# Patient Record
Sex: Male | Born: 1955 | Race: White | Hispanic: No | Marital: Married | Smoking: Never smoker
Health system: Southern US, Community
[De-identification: ages and names within clinical notes are randomized; demographics above are authoritative.]

## PROBLEM LIST (undated history)

## (undated) DIAGNOSIS — N200 Calculus of kidney: Secondary | ICD-10-CM

## (undated) DIAGNOSIS — Z87442 Personal history of urinary calculi: Secondary | ICD-10-CM

## (undated) DIAGNOSIS — E785 Hyperlipidemia, unspecified: Secondary | ICD-10-CM

## (undated) DIAGNOSIS — M79651 Pain in right thigh: Secondary | ICD-10-CM

## (undated) DIAGNOSIS — G629 Polyneuropathy, unspecified: Secondary | ICD-10-CM

## (undated) DIAGNOSIS — C61 Malignant neoplasm of prostate: Secondary | ICD-10-CM

## (undated) DIAGNOSIS — R911 Solitary pulmonary nodule: Secondary | ICD-10-CM

## (undated) DIAGNOSIS — M6208 Separation of muscle (nontraumatic), other site: Secondary | ICD-10-CM

## (undated) DIAGNOSIS — G5600 Carpal tunnel syndrome, unspecified upper limb: Secondary | ICD-10-CM

## (undated) DIAGNOSIS — R7301 Impaired fasting glucose: Secondary | ICD-10-CM

## (undated) DIAGNOSIS — R202 Paresthesia of skin: Secondary | ICD-10-CM

## (undated) DIAGNOSIS — M47816 Spondylosis without myelopathy or radiculopathy, lumbar region: Secondary | ICD-10-CM

## (undated) DIAGNOSIS — N529 Male erectile dysfunction, unspecified: Secondary | ICD-10-CM

## (undated) DIAGNOSIS — K432 Incisional hernia without obstruction or gangrene: Secondary | ICD-10-CM

## (undated) DIAGNOSIS — D472 Monoclonal gammopathy: Secondary | ICD-10-CM

## (undated) DIAGNOSIS — C439 Malignant melanoma of skin, unspecified: Secondary | ICD-10-CM

## (undated) HISTORY — DX: Male erectile dysfunction, unspecified: N52.9

## (undated) HISTORY — DX: Spondylosis without myelopathy or radiculopathy, lumbar region: M47.816

## (undated) HISTORY — PX: OTHER SURGICAL HISTORY: SHX169

## (undated) HISTORY — DX: Calculus of kidney: N20.0

## (undated) HISTORY — DX: Malignant neoplasm of prostate: C61

## (undated) HISTORY — DX: Carpal tunnel syndrome, unspecified upper limb: G56.00

## (undated) HISTORY — DX: Pain in right thigh: M79.651

## (undated) HISTORY — DX: Incisional hernia without obstruction or gangrene: K43.2

## (undated) HISTORY — DX: Hyperlipidemia, unspecified: E78.5

## (undated) HISTORY — DX: Polyneuropathy, unspecified: G62.9

## (undated) HISTORY — DX: Impaired fasting glucose: R73.01

## (undated) HISTORY — DX: Paresthesia of skin: R20.2

## (undated) HISTORY — DX: Malignant melanoma of skin, unspecified: C43.9

## (undated) HISTORY — DX: Solitary pulmonary nodule: R91.1

## (undated) HISTORY — DX: Separation of muscle (nontraumatic), other site: M62.08

## (undated) HISTORY — DX: Monoclonal gammopathy: D47.2

---

## 1998-05-11 HISTORY — PX: KNEE ARTHROSCOPY: SUR90

## 1998-05-11 HISTORY — PX: REFRACTIVE SURGERY: SHX103

## 2000-05-11 HISTORY — PX: ROTATOR CUFF REPAIR: SHX139

## 2001-05-11 HISTORY — PX: ROTATOR CUFF REPAIR: SHX139

## 2007-05-12 DIAGNOSIS — R911 Solitary pulmonary nodule: Secondary | ICD-10-CM

## 2007-05-12 HISTORY — DX: Solitary pulmonary nodule: R91.1

## 2007-09-30 DIAGNOSIS — D229 Melanocytic nevi, unspecified: Secondary | ICD-10-CM

## 2007-09-30 HISTORY — DX: Melanocytic nevi, unspecified: D22.9

## 2007-10-11 ENCOUNTER — Ambulatory Visit: Payer: Self-pay | Admitting: Internal Medicine

## 2007-10-11 DIAGNOSIS — J9819 Other pulmonary collapse: Secondary | ICD-10-CM | POA: Insufficient documentation

## 2007-10-11 DIAGNOSIS — R0602 Shortness of breath: Secondary | ICD-10-CM | POA: Insufficient documentation

## 2007-10-11 DIAGNOSIS — R059 Cough, unspecified: Secondary | ICD-10-CM | POA: Insufficient documentation

## 2007-10-11 DIAGNOSIS — R05 Cough: Secondary | ICD-10-CM | POA: Insufficient documentation

## 2007-10-11 DIAGNOSIS — R062 Wheezing: Secondary | ICD-10-CM | POA: Insufficient documentation

## 2007-11-21 ENCOUNTER — Encounter: Payer: Self-pay | Admitting: Internal Medicine

## 2007-11-21 ENCOUNTER — Ambulatory Visit: Payer: Self-pay | Admitting: Internal Medicine

## 2007-11-25 ENCOUNTER — Ambulatory Visit: Payer: Self-pay | Admitting: Internal Medicine

## 2008-04-24 ENCOUNTER — Ambulatory Visit: Payer: Self-pay | Admitting: Internal Medicine

## 2008-04-24 DIAGNOSIS — J984 Other disorders of lung: Secondary | ICD-10-CM | POA: Insufficient documentation

## 2008-04-24 DIAGNOSIS — J329 Chronic sinusitis, unspecified: Secondary | ICD-10-CM | POA: Insufficient documentation

## 2008-05-08 ENCOUNTER — Ambulatory Visit: Payer: Self-pay | Admitting: Cardiovascular Disease

## 2008-06-22 ENCOUNTER — Ambulatory Visit: Payer: Self-pay | Admitting: Internal Medicine

## 2009-04-16 ENCOUNTER — Ambulatory Visit: Payer: Self-pay | Admitting: Cardiovascular Disease

## 2010-06-01 ENCOUNTER — Encounter: Payer: Self-pay | Admitting: Internal Medicine

## 2011-03-27 ENCOUNTER — Encounter: Payer: Self-pay | Admitting: Gastroenterology

## 2011-04-09 ENCOUNTER — Ambulatory Visit (AMBULATORY_SURGERY_CENTER): Payer: 59 | Admitting: *Deleted

## 2011-04-09 ENCOUNTER — Encounter: Payer: Self-pay | Admitting: Gastroenterology

## 2011-04-09 VITALS — Ht 72.0 in | Wt 211.7 lb

## 2011-04-09 DIAGNOSIS — Z1211 Encounter for screening for malignant neoplasm of colon: Secondary | ICD-10-CM

## 2011-04-09 MED ORDER — PEG-KCL-NACL-NASULF-NA ASC-C 100 G PO SOLR: ORAL | Status: DC

## 2011-04-15 NOTE — Progress Notes (Signed)
Addended by: Maple Hudson on: 04/15/2011 03:57 PM   Modules accepted: Level of Service

## 2011-04-20 ENCOUNTER — Encounter: Payer: Self-pay | Admitting: Gastroenterology

## 2011-04-20 ENCOUNTER — Ambulatory Visit (AMBULATORY_SURGERY_CENTER): Payer: 59 | Admitting: Gastroenterology

## 2011-04-20 VITALS — BP 135/86 | HR 70 | Temp 97.1°F | Resp 16 | Ht 72.0 in | Wt 211.0 lb

## 2011-04-20 DIAGNOSIS — Z1211 Encounter for screening for malignant neoplasm of colon: Secondary | ICD-10-CM

## 2011-04-20 HISTORY — PX: COLONOSCOPY: SHX174

## 2011-04-20 MED ORDER — SODIUM CHLORIDE 0.9 % IV SOLN: 500.0000 mL | INTRAVENOUS | Status: DC

## 2011-04-20 NOTE — Patient Instructions (Signed)
Please refer to your blue and neon green sheets for instructions regarding diet and activity for the rest of today.  You may resume your medications as you would normally take them.  

## 2011-04-20 NOTE — Progress Notes (Signed)
Patient did not experience any of the following events: a burn prior to discharge; a fall within the facility; wrong site/side/patient/procedure/implant event; or a hospital transfer or hospital admission upon discharge from the facility. (G8907) Patient did not have preoperative order for IV antibiotic SSI prophylaxis. (G8918)  

## 2011-04-20 NOTE — Progress Notes (Signed)
Pressure applied to the abdomen to reach cecum. 

## 2011-04-20 NOTE — Op Note (Signed)
[facility] [address] [place], [place]  [address]  COLONOSCOPY PROCEDURE REPORT  PATIENT:  Rodney Watkins, Rodney Watkins  MR#:  [id number] BIRTHDATE:  04/10/1956, 55 yrs. old  GENDER:  male ENDOSCOPIST:  Rachael Fee, MD PROCEDURE DATE:  04/20/2011 PROCEDURE:  Colonoscopy 19147 ASA CLASS:  Class II INDICATIONS:  Routine Risk Screening MEDICATIONS:   Fentanyl 75 mcg IV, These medications were titrated to patient response per physician's verbal order, Versed 8 mg IV  DESCRIPTION OF PROCEDURE:   After the risks benefits and alternatives of the procedure were thoroughly explained, informed consent was obtained.  Digital rectal exam was performed and revealed no rectal masses.   The [id number] endoscope was introduced through the anus and advanced to the cecum, which was identified by both the appendix and ileocecal valve, without limitations.  The quality of the prep was good..  The instrument was then slowly withdrawn as the colon was fully examined. <<PROCEDUREIMAGES>> FINDINGS:  A normal appearing cecum, ileocecal valve, and appendiceal orifice were identified. The ascending, hepatic flexure, transverse, splenic flexure, descending, sigmoid colon, and rectum appeared unremarkable (see image1, image2, and image3). Retroflexed views in the rectum revealed no abnormalities. COMPLICATIONS:  None  ENDOSCOPIC IMPRESSION: 1) Normal colon 2) No polyps or cancers  RECOMMENDATIONS: 1) You should continue to follow colorectal cancer screening guidelines for "routine risk" patients with a repeat colonoscopy in 10 years. There is no need for FOBT (stool) testing for at least 5 years.  REPEAT EXAM:  10 years  ______________________________ Rachael Fee, MD  cc: Kathrine Haddock, MD  n. Rosalie Doctor:   Rachael Fee at 04/20/2011 10:09 AM  Karlton Lemon, [phone]

## 2011-04-21 ENCOUNTER — Telehealth: Payer: Self-pay | Admitting: *Deleted

## 2011-04-21 NOTE — Telephone Encounter (Signed)

## 2011-08-05 DIAGNOSIS — C4492 Squamous cell carcinoma of skin, unspecified: Secondary | ICD-10-CM

## 2011-08-05 HISTORY — DX: Squamous cell carcinoma of skin, unspecified: C44.92

## 2011-11-18 DIAGNOSIS — L814 Other melanin hyperpigmentation: Secondary | ICD-10-CM

## 2011-11-18 HISTORY — DX: Other melanin hyperpigmentation: L81.4

## 2012-07-27 LAB — PULMONARY FUNCTION TEST

## 2013-08-28 LAB — PULMONARY FUNCTION TEST

## 2015-03-26 ENCOUNTER — Ambulatory Visit (INDEPENDENT_AMBULATORY_CARE_PROVIDER_SITE_OTHER): Payer: Commercial Managed Care - HMO | Admitting: Family Medicine

## 2015-03-26 ENCOUNTER — Encounter: Payer: Self-pay | Admitting: Family Medicine

## 2015-03-26 VITALS — BP 144/90 | HR 68 | Temp 97.7°F | Resp 16 | Ht 71.75 in | Wt 208.0 lb

## 2015-03-26 DIAGNOSIS — R030 Elevated blood-pressure reading, without diagnosis of hypertension: Secondary | ICD-10-CM

## 2015-03-26 DIAGNOSIS — Z0000 Encounter for general adult medical examination without abnormal findings: Secondary | ICD-10-CM | POA: Diagnosis not present

## 2015-03-26 NOTE — Progress Notes (Signed)
Pre visit review using our clinic review tool, if applicable. No additional management support is needed unless otherwise documented below in the visit note. 

## 2015-03-26 NOTE — Progress Notes (Signed)
Office Note 03/26/2015  CC:  Chief Complaint  Patient presents with  . Establish Care  . Annual Exam    Pt is not fasting.     HPI:  Rodney Watkins is a 59 y.o. White male who is here to establish care, get CPE. Patient's most recent primary MD: none Old records in EPIC/HL were reviewed prior to or during today's visit.  Gets CPE with fire dept annually, most recent around 06/2014---gets full lab panel including PSA and possibly A1c with this.  He'll drop off these notes/labs from the last 5 yrs when he gets a chance. Says he does not usually get DRE with these exams though, so we decided to include this in today's exam.  Past Medical History  Diagnosis Date  . Pulmonary nodule 2009    Stable CT chest 2010--no further scans needed in pt at low risk for lung ca.  . Increased prostate specific antigen (PSA) velocity     saw urol and was reassured    Past Surgical History  Procedure Laterality Date  . Rotator cuff repair  2002    right  . Rotator cuff repair  2003    left  . Knee arthroscopy  2000    left  . Refractive surgery  2000    both eyes  . Colonoscopy  04/20/11    Normal: recall 10 yrs  . Ett  approx 2010    normal    Family History  Problem Relation Age of Onset  . Colon cancer Neg Hx   . Stomach cancer Neg Hx   . Diabetes Father   . Cancer Sister     unknown    Social History   Social History  . Marital Status: Married    Spouse Name: N/A  . Number of Children: N/A  . Years of Education: N/A   Occupational History  . Not on file.   Social History Main Topics  . Smoking status: Former Smoker    Quit date: 04/08/1982  . Smokeless tobacco: Never Used  . Alcohol Use: 1.8 oz/week    3 Cans of beer per week  . Drug Use: No  . Sexual Activity: Not on file   Other Topics Concern  . Not on file   Social History Narrative   Married, 3 daughters.   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   No T/A/Ds.      MEDS: none  No Known Allergies  ROS Review of Systems  Constitutional: Negative for fever, chills, appetite change and fatigue.  HENT: Negative for congestion, dental problem, ear pain and sore throat.   Eyes: Negative for discharge, redness and visual disturbance.  Respiratory: Negative for cough, chest tightness, shortness of breath and wheezing.   Cardiovascular: Negative for chest pain, palpitations and leg swelling.  Gastrointestinal: Negative for nausea, vomiting, abdominal pain, diarrhea and blood in stool.  Genitourinary: Negative for dysuria, urgency, frequency, hematuria, flank pain and difficulty urinating.  Musculoskeletal: Negative for myalgias, joint swelling and neck stiffness.       Some recent low back, L hip, and left leg pain that he is under the care of ortho, Dr. Ninfa Linden, for.  Skin: Negative for pallor and rash.  Neurological: Negative for dizziness, speech difficulty, weakness and headaches.  Hematological: Negative for adenopathy. Does not bruise/bleed easily.  Psychiatric/Behavioral: Negative for confusion and sleep disturbance. The patient is not nervous/anxious.     PE; Blood pressure 144/90,  pulse 68, temperature 97.7 F (36.5 C), temperature source Oral, resp. rate 16, height 5' 11.75" (1.822 m), weight 208 lb (94.348 kg), SpO2 97 %.  Repeat bp 144/90 Gen: Alert, well appearing.  Patient is oriented to person, place, time, and situation. AFFECT: pleasant, lucid thought and speech. ENT: Ears: EACs clear, normal epithelium.  TMs with good light reflex and landmarks bilaterally.  Eyes: no injection, icteris, swelling, or exudate.  EOMI, PERRLA. Nose: no drainage or turbinate edema/swelling.  No injection or focal lesion.  Mouth: lips without lesion/swelling.  Oral mucosa pink and moist.  Dentition intact and without obvious caries or gingival swelling.  Oropharynx without erythema, exudate, or swelling.  Neck: supple/nontender.  No LAD, mass,  or TM.  Carotid pulses 2+ bilaterally, without bruits. CV: RRR, no m/r/g.   LUNGS: CTA bilat, nonlabored resps, good aeration in all lung fields. ABD: soft, NT, ND, BS normal.  No hepatospenomegaly or mass.  No bruits. EXT: no clubbing, cyanosis, or edema.  Musculoskeletal: no joint swelling, erythema, warmth, or tenderness.  ROM of all joints intact. Skin - no sores or suspicious lesions or rashes or color changes Rectal exam: negative without mass, lesions or tenderness, PROSTATE EXAM: smooth and symmetric without nodules or tenderness.  Pertinent labs:  None today  ASSESSMENT AND PLAN:   New pt; no old PCP records to obtain.  1) Health maintenance exam: Reviewed age and gender appropriate health maintenance issues (prudent diet, regular exercise, health risks of tobacco and excessive alcohol, use of seatbelts, fire alarms in home, use of sunscreen).  Also reviewed age and gender appropriate health screening as well as vaccine recommendations. No vaccines due at this time. No labs due at this time.   DRE normal today: will review pt's PSA's when he brings in his old records.  2) Elevated bp w/out dx of HTN: plan is to check bp outside of office 3-4 times per week and write them down and return to go over them together in 3-4 wks.  3) Recent musculoskeletal pain (LB, L hip, L leg): sounds like overuse/strain.  He is seeing ortho, Dr. Ninfa Linden for this. Apparently got a shot in his hip, not much help.  Relative rest helps some.  Has appt to revisit ortho soon. Will try to get records.  An After Visit Summary was printed and given to the patient.  Return for 3-4 week f/u elevated bp.

## 2015-03-26 NOTE — Patient Instructions (Signed)
Check your blood pressure 3-4 times per week and write these down.  Return to office for review of these with me in 3-4 weeks.

## 2015-04-02 ENCOUNTER — Other Ambulatory Visit: Payer: Self-pay | Admitting: Orthopaedic Surgery

## 2015-04-02 DIAGNOSIS — M545 Low back pain: Secondary | ICD-10-CM

## 2015-04-17 ENCOUNTER — Ambulatory Visit (INDEPENDENT_AMBULATORY_CARE_PROVIDER_SITE_OTHER): Payer: Commercial Managed Care - HMO | Admitting: Family Medicine

## 2015-04-17 ENCOUNTER — Ambulatory Visit
Admission: RE | Admit: 2015-04-17 | Discharge: 2015-04-17 | Disposition: A | Discharge status: Home / Self Care | Payer: Commercial Managed Care - HMO | Source: Ambulatory Visit | Attending: Orthopaedic Surgery | Admitting: Orthopaedic Surgery

## 2015-04-17 ENCOUNTER — Encounter: Payer: Self-pay | Admitting: Family Medicine

## 2015-04-17 VITALS — BP 161/84 | HR 70 | Temp 97.9°F | Resp 16 | Ht 71.75 in | Wt 209.5 lb

## 2015-04-17 DIAGNOSIS — R030 Elevated blood-pressure reading, without diagnosis of hypertension: Secondary | ICD-10-CM | POA: Diagnosis not present

## 2015-04-17 DIAGNOSIS — M545 Low back pain: Secondary | ICD-10-CM

## 2015-04-17 NOTE — Progress Notes (Signed)
Pre visit review using our clinic review tool, if applicable. No additional management support is needed unless otherwise documented below in the visit note. 

## 2015-04-17 NOTE — Progress Notes (Signed)
OFFICE VISIT  04/17/2015   CC:  Chief Complaint  Patient presents with  . Follow-up    Elevated BP. Pt is fasting.    HPI:    Patient is a 59 y.o. Caucasian male who presents for 3 week f/u for elevated blood pressure w/out dx of HTN. Lots of stress with job/family lately/financial.  In pain with back and legs. Avg syst with home checks lately 140s, avg diast 70s. He has cut out excessive coffee intake.  Not exercising.   Says he is naturally a high strung/type A personality type person.  Past Medical History  Diagnosis Date  . Pulmonary nodule 2009    Stable CT chest 2010--no further scans needed in pt at low risk for lung ca.  . Increased prostate specific antigen (PSA) velocity     saw urol and was reassured    Past Surgical History  Procedure Laterality Date  . Rotator cuff repair  2002    right  . Rotator cuff repair  2003    left  . Knee arthroscopy  2000    left  . Refractive surgery  2000    both eyes  . Colonoscopy  04/20/11    Normal: recall 10 yrs  . Ett  approx 2010    normal    No outpatient prescriptions prior to visit.   No facility-administered medications prior to visit.    No Known Allergies  ROS As per HPI  PE: Blood pressure 161/84, pulse 70, temperature 97.9 F (36.6 C), temperature source Oral, resp. rate 16, height 5' 11.75" (1.822 m), weight 209 lb 8 oz (95.029 kg), SpO2 95 %. Gen: Alert, well appearing.  Patient is oriented to person, place, time, and situation. AFFECT: pleasant, lucid thought and speech. No further exam today.  LABS:  none  IMPRESSION AND PLAN:  Elevated bp w/out dx of HTN: I told him I think he likely has mild HTN and that the outside stressors and pain he deals with may or may not be affecting his bp, hard to tell.  I recommended we start a bp med today but he opted to continue obs of bp with home monitoring at this time. Hopefully some of the stressors in his life will be alleviated soon and we'll see how  bp responds.  An After Visit Summary was printed and given to the patient.   FOLLOW UP: Return in about 6 months (around 10/16/2015) for f/u elevated bp.

## 2015-04-19 ENCOUNTER — Encounter: Payer: Self-pay | Admitting: Family Medicine

## 2015-10-16 ENCOUNTER — Ambulatory Visit: Payer: Commercial Managed Care - HMO | Admitting: Family Medicine

## 2016-07-07 ENCOUNTER — Other Ambulatory Visit: Payer: Self-pay | Admitting: Neurosurgery

## 2016-07-07 DIAGNOSIS — C61 Malignant neoplasm of prostate: Secondary | ICD-10-CM

## 2016-07-09 ENCOUNTER — Ambulatory Visit
Admission: RE | Admit: 2016-07-09 | Discharge: 2016-07-09 | Disposition: A | Discharge status: Home / Self Care | Payer: Commercial Managed Care - HMO | Source: Ambulatory Visit | Attending: Neurosurgery | Admitting: Neurosurgery

## 2016-07-09 DIAGNOSIS — C61 Malignant neoplasm of prostate: Secondary | ICD-10-CM

## 2016-07-09 MED ORDER — GADOBENATE DIMEGLUMINE 529 MG/ML IV SOLN
19.0000 mL | Freq: Once | INTRAVENOUS | Status: AC | PRN
  Administered 2016-07-09: 19 mL via INTRAVENOUS

## 2016-07-16 ENCOUNTER — Encounter (HOSPITAL_COMMUNITY): Payer: Self-pay | Admitting: *Deleted

## 2016-07-16 ENCOUNTER — Other Ambulatory Visit: Payer: Self-pay | Admitting: Urology

## 2016-07-29 ENCOUNTER — Encounter: Payer: Self-pay | Admitting: Family Medicine

## 2016-08-03 NOTE — Patient Instructions (Addendum)
REMINGTON HIGHBAUGH III  08/03/2016   Your procedure is scheduled on: 3/29  Report to [facility] Main  Entrance take [facility]  elevators to 3rd floor to  [place] at    0900 AM.  Call this number if you have problems the morning of surgery [phone]   Remember: ONLY 1 PERSON MAY GO WITH YOU TO SHORT STAY TO GET  READY MORNING OF YOUR SURGERY.               ONE bottle of magnesium citrate  By noon the day before surgery  And one fleets enema the night before surgery  CLEAR LIQUID DIET   The Day before surgery   Foods Allowed                                                                     Foods Excluded  Coffee and tea, regular and decaf                             liquids that you cannot  Plain Jell-O in any flavor                                             see through such as: Fruit ices (not with fruit pulp)                                     milk, soups, orange juice  Iced Popsicles                                    All solid food Carbonated beverages, regular and diet                                    Cranberry, grape and apple juices Sports drinks like Gatorade Lightly seasoned clear broth or consume(fat free) Sugar, honey syrup  Sample Menu Breakfast                                Lunch                                     Supper Cranberry juice                    Beef broth                            Chicken broth Jell-O  Grape juice                           Apple juice Coffee or tea                        Jell-O                                      Popsicle                                                Coffee or tea                        Coffee or tea  _____________________________________________________________________    Do not eat food or drink liquids :After Midnight.     Take these medicines the morning of surgery with A SIP OF WATER: Cipro if still taking, zyrtec if needed                   You may not have any metal on your body including hair pins and              piercings  Do not wear jewelry,  lotions, powders or perfumes, deodorant                         Men may shave face and neck.   Do not bring valuables to the hospital. [place].  Contacts, dentures or bridgework may not be worn into surgery.  Leave suitcase in the car. After surgery it may be brought to your room.                  Please read over the following fact sheets you were given: _____________________________________________________________________ [facility] - Preparing for Surgery Before surgery, you can play an important role.  Because skin is not sterile, your skin needs to be as free of germs as possible.  You can reduce the number of germs on your skin by washing with CHG (chlorahexidine gluconate) soap before surgery.  CHG is an antiseptic cleaner which kills germs and bonds with the skin to continue killing germs even after washing. Please DO NOT use if you have an allergy to CHG or antibacterial soaps.  If your skin becomes reddened/irritated stop using the CHG and inform your nurse when you arrive at Short Stay. Do not shave (including legs and underarms) for at least 48 hours prior to the first CHG shower.  You may shave your face/neck. Please follow these instructions carefully:  1.  Shower with CHG Soap the night before surgery and the  morning of Surgery.  2.  If you choose to wash your hair, wash your hair first as usual with your  normal  shampoo.  3.  After you shampoo, rinse your hair and body thoroughly to remove the  shampoo.                           4.  Use CHG as  you would any other liquid soap.  You can apply chg directly  to the skin and wash                       Gently with a scrungie or clean washcloth.  5.  Apply the CHG Soap to your body ONLY FROM THE NECK DOWN.   Do not use on face/ open                            Wound or open sores. Avoid contact with eyes, ears mouth and genitals (private parts).                       Wash face,  Genitals (private parts) with your normal soap.             6.  Wash thoroughly, paying special attention to the area where your surgery  will be performed.  7.  Thoroughly rinse your body with warm water from the neck down.  8.  DO NOT shower/wash with your normal soap after using and rinsing off  the CHG Soap.                9.  Pat yourself dry with a clean towel.            10.  Wear clean pajamas.            11.  Place clean sheets on your bed the night of your first shower and do not  sleep with pets. Day of Surgery : Do not apply any lotions/deodorants the morning of surgery.  Please wear clean clothes to the hospital/surgery center.  FAILURE TO FOLLOW THESE INSTRUCTIONS MAY RESULT IN THE CANCELLATION OF YOUR SURGERY PATIENT SIGNATURE_________________________________  NURSE SIGNATURE__________________________________  ________________________________________________________________________   Adam Phenix  An incentive spirometer is a tool that can help keep your lungs clear and active. This tool measures how well you are filling your lungs with each breath. Taking long deep breaths may help reverse or decrease the chance of developing breathing (pulmonary) problems (especially infection) following:  A long period of time when you are unable to move or be active. BEFORE THE PROCEDURE   If the spirometer includes an indicator to show your best effort, your nurse or respiratory therapist will set it to a desired goal.  If possible, sit up straight or lean slightly forward. Try not to slouch.  Hold the incentive spirometer in an upright position. INSTRUCTIONS FOR USE  1. Sit on the edge of your bed if possible, or sit up as far as you can in bed or on a chair. 2. Hold the incentive spirometer in an upright position. 3. Breathe out normally. 4. Place  the mouthpiece in your mouth and seal your lips tightly around it. 5. Breathe in slowly and as deeply as possible, raising the piston or the ball toward the top of the column. 6. Hold your breath for 3-5 seconds or for as long as possible. Allow the piston or ball to fall to the bottom of the column. 7. Remove the mouthpiece from your mouth and breathe out normally. 8. Rest for a few seconds and repeat Steps 1 through 7 at least 10 times every 1-2 hours when you are awake. Take your time and take a few normal breaths between deep breaths. 9. The spirometer may include an indicator to show your best effort. Use  the indicator as a goal to work toward during each repetition. 10. After each set of 10 deep breaths, practice coughing to be sure your lungs are clear. If you have an incision (the cut made at the time of surgery), support your incision when coughing by placing a pillow or rolled up towels firmly against it. Once you are able to get out of bed, walk around indoors and cough well. You may stop using the incentive spirometer when instructed by your caregiver.  RISKS AND COMPLICATIONS  Take your time so you do not get dizzy or light-headed.  If you are in pain, you may need to take or ask for pain medication before doing incentive spirometry. It is harder to take a deep breath if you are having pain. AFTER USE  Rest and breathe slowly and easily.  It can be helpful to keep track of a log of your progress. Your caregiver can provide you with a simple table to help with this. If you are using the spirometer at home, follow these instructions: [place] IF:   You are having difficultly using the spirometer.  You have trouble using the spirometer as often as instructed.  Your pain medication is not giving enough relief while using the spirometer.  You develop fever of 100.5 F (38.1 C) or higher. SEEK IMMEDIATE MEDICAL CARE IF:   You cough up bloody sputum that had not been  present before.  You develop fever of 102 F (38.9 C) or greater.  You develop worsening pain at or near the incision site. MAKE SURE YOU:   Understand these instructions.  Will watch your condition.  Will get help right away if you are not doing well or get worse. Document Released: 09/07/2006 Document Revised: 07/20/2011 Document Reviewed: 11/08/2006 [facility] Patient Information 2014 [place], [place].   ________________________________________________________________________

## 2016-08-04 ENCOUNTER — Encounter (HOSPITAL_COMMUNITY)
Admission: RE | Admit: 2016-08-04 | Discharge: 2016-08-04 | Disposition: A | Discharge status: Home / Self Care | Payer: Commercial Managed Care - HMO | Source: Ambulatory Visit | Attending: Urology | Admitting: Urology

## 2016-08-04 ENCOUNTER — Encounter (HOSPITAL_COMMUNITY): Payer: Self-pay

## 2016-08-04 ENCOUNTER — Ambulatory Visit (HOSPITAL_COMMUNITY)
Admission: RE | Admit: 2016-08-04 | Discharge: 2016-08-04 | Disposition: A | Discharge status: Home / Self Care | Payer: Commercial Managed Care - HMO | Source: Ambulatory Visit | Attending: Urology | Admitting: Urology

## 2016-08-04 DIAGNOSIS — R918 Other nonspecific abnormal finding of lung field: Secondary | ICD-10-CM | POA: Diagnosis not present

## 2016-08-04 DIAGNOSIS — Z01810 Encounter for preprocedural cardiovascular examination: Secondary | ICD-10-CM | POA: Diagnosis present

## 2016-08-04 DIAGNOSIS — Z01818 Encounter for other preprocedural examination: Secondary | ICD-10-CM | POA: Diagnosis present

## 2016-08-04 DIAGNOSIS — Z01812 Encounter for preprocedural laboratory examination: Secondary | ICD-10-CM | POA: Insufficient documentation

## 2016-08-04 HISTORY — DX: Personal history of urinary calculi: Z87.442

## 2016-08-04 LAB — BASIC METABOLIC PANEL
ANION GAP: 5 (ref 5–15)
BUN: 17 mg/dL (ref 6–20)
CHLORIDE: 110 mmol/L (ref 101–111)
CO2: 24 mmol/L (ref 22–32)
Calcium: 10 mg/dL (ref 8.9–10.3)
Creatinine, Ser: 0.97 mg/dL (ref 0.61–1.24)
GFR calc non Af Amer: >60 mL/min (ref >60)
Glucose, Bld: 96 mg/dL (ref 65–99)
POTASSIUM: 4 mmol/L (ref 3.5–5.1)
Sodium: 139 mmol/L (ref 135–145)

## 2016-08-04 LAB — CBC
HEMATOCRIT: 42.3 % (ref 39.0–52.0)
HEMOGLOBIN: 14.3 g/dL (ref 13.0–17.0)
MCH: 30.1 pg (ref 26.0–34.0)
MCHC: 33.8 g/dL (ref 30.0–36.0)
MCV: 89.1 fL (ref 78.0–100.0)
PLATELETS: 262 10*3/uL (ref 150–400)
RBC: 4.75 MIL/uL (ref 4.22–5.81)
RDW: 14 % (ref 11.5–15.5)
WBC: 5.5 10*3/uL (ref 4.0–10.5)

## 2016-08-04 LAB — ABO/RH: ABO/RH(D): A POS

## 2016-08-05 NOTE — H&P (Signed)
Office Visit Report     07/24/2016   --------------------------------------------------------------------------------   Rodney Watkins  MRN: [id number]  PRIMARY CARE:  Rito Ehrlich. Geoffry Paradise, MD  DOB: 09-Sep-1955, 61 year old Male  REFERRING:  Rana Snare, MD  SSN: -[id number]  PROVIDER:  Rana Snare, M.D.    TREATING:  Raynelle Bring, M.D.    LOCATION:  [facility] [phone]   --------------------------------------------------------------------------------   CC/HPI: CC: Prostate Cancer   Physician requesting consult: Dr. Rana Snare  PCP: Dr. Dannial Monarch   Rodney Watkins is a 61 year old gentleman with a rising PSA that increased to 3.22 (baseline was 1.8). This prompted a TRUS biopsy of the prostate on 06/17/16 that demonstrated Gleason 3+3=6 adenocarcinoma of the prostate with 9 out of 12 biopsy cores positive for malignancy. He did develop prostatitis following his biopsy and is currently on a long course of treatment with ciprofloxacin.   Family history: None.   Imaging studies: None.   PMH: He has no major medical comorbid conditions.  PSH: No abdominal surgeries.   TNM stage: cT1c Nx Mx  PSA: 3.22  Gleason score: 3+3=6  Biopsy (06/17/16): 9/12 cores positive  Left: L apex (50%, PNI), L lateral mid (10%, 3+3=6), L mid (40%, 3+3=6, PNI), L lateral base (30%, 3+3=6)  Right: R apex (10%, 3+3=6), R mid (70%, 3+3=6, PNI), R lateral mid (20%, 3+3=6), R base (90%, 3+3=6, PNI), R lateral base (20%, 3+3=6)  Prostate volume: 33.0 cc  PSAD: 0.10   Nomogram  OC disease: 41%  EPE: 58%  SVI: 4%  LNI: 3%  PFS (5 year, 10 year): 94%,90%   Urinary function: IPSS is 11. His most bothersome symptoms are nocturia and urgency/frequency. These symptoms have been exacerbated by his recent episode of prostatitis but did exist even at baseline.  Erectile function: SHIM score is 22. He is able to achieve erections adequate for intercourse without medication on a regular basis.      ALLERGIES: No Allergies    MEDICATIONS: Cipro 500 mg tablet 1 tablet PO BID  Meloxicam 15 mg tablet 1 tablet PO Daily  Aleve  Multi Vitamin/Minerals TABS Oral     GU PSH: Prostate Needle Biopsy - 06/17/2016      PSH Notes: Knee Surgery, Shoulder Surgery   NON-GU PSH: Surgical Pathology, Gross And Microscopic Examination For Prostate Needle - 06/17/2016    GU PMH: Acute prostatitis, Inflammatory, non bacterial, pending urine c/s. - 07/07/2016 Prostate Cancer - 06/24/2016 ED, arterial insufficiency, Erectile dysfunction due to arterial insufficiency - 07/31/2015 BPH w/LUTS, Benign prostatic hyperplasia with urinary obstruction - 2014 Testicular pain, unspecified, Testicular pain - 2014      PMH Notes:  2012-11-08 09:11:25 - Note: No Medical Problems   NON-GU PMH: Personal history of nicotine dependence, History of tobacco use - 2014    FAMILY HISTORY: Diabetes - Father Family Health Status Number - Father Family Health Status Of Mother - Alive 85 - Father Father Deceased At [employer] ___ - Father nephrolithiasis - Father   SOCIAL HISTORY: Marital Status: Married Current Smoking Status: Patient has never smoked.  Drinks 2 drinks per week. Types of alcohol consumed: Beer. Light Drinker.  Drinks 1 caffeinated drink per day. Patient's occupation Building services engineer.    REVIEW OF SYSTEMS:    GU Review Male:   Patient denies frequent urination, hard to postpone urination, burning/ pain with urination, get up at night to urinate, leakage of urine, stream starts and stops, trouble starting your  streams, and have to strain to urinate .  Gastrointestinal (Lower):   Patient denies diarrhea and constipation.  Gastrointestinal (Upper):   Patient denies nausea and vomiting.  Constitutional:   Patient denies fever, night sweats, weight loss, and fatigue.  Skin:   Patient denies skin rash/ lesion and itching.  Eyes:   Patient denies blurred vision and double vision.  Ears/ Nose/ Throat:    Patient denies sore throat and sinus problems.  Hematologic/Lymphatic:   Patient denies easy bruising and swollen glands.  Cardiovascular:   Patient denies leg swelling and chest pains.  Respiratory:   Patient denies cough and shortness of breath.  Endocrine:   Patient denies excessive thirst.  Musculoskeletal:   Patient denies back pain and joint pain.  Neurological:   Patient denies headaches and dizziness.  Psychologic:   Patient denies depression and anxiety.   VITAL SIGNS:      07/24/2016 11:49 AM  Weight 205 lb / 92.99 kg  Height 73 in / 185.42 cm  BP 123/77 mmHg  Pulse 78 /min  BMI 27.0 kg/m   GU PHYSICAL EXAMINATION:    Prostate: 35 cc. No nodularity or induration.   MULTI-SYSTEM PHYSICAL EXAMINATION:    Constitutional: Well-nourished. No physical deformities. Normally developed. Good grooming.  Neck: Neck symmetrical, not swollen. Normal tracheal position.  Respiratory: No labored breathing, no use of accessory muscles. Clear bilaterally.  Cardiovascular: Normal temperature, normal extremity pulses, no swelling, no varicosities. Regular rate and rhythm.  Lymphatic: No enlargement of neck, axillae, groin.  Skin: No paleness, no jaundice, no cyanosis. No lesion, no ulcer, no rash.  Neurologic / Psychiatric: Oriented to time, oriented to place, oriented to person. No depression, no anxiety, no agitation.  Gastrointestinal: No mass, no tenderness, no rigidity, non obese abdomen.  Eyes: Normal conjunctivae. Normal eyelids.  Ears, Nose, Mouth, and Throat: Left ear no scars, no lesions, no masses. Right ear no scars, no lesions, no masses. Nose no scars, no lesions, no masses. Normal hearing. Normal lips.  Musculoskeletal: Normal gait and station of head and neck.     PAST DATA REVIEWED:  Source Of History:  Patient  Lab Test Review:   PSA  Records Review:   Pathology Reports, Previous Patient Records  Urine Test Review:   Urinalysis   03/17/16 09/07/15 11/08/12  PSA   Total PSA 3.22 ng/dl 2.91  1.74   Free PSA  0.45    % Free PSA  15      PROCEDURES:          Urinalysis - 81003 Dipstick Dipstick Cont'd  Color: Yellow Bilirubin: Neg  Appearance: Clear Ketones: Neg  Specific Gravity: 1.025 Blood: Neg  pH: 5.5 Protein: Neg  Glucose: Neg Urobilinogen: 0.2    Nitrites: Neg    Leukocyte Esterase: Neg    Notes:      ASSESSMENT:      ICD-10 Details  1 GU:   Prostate Cancer - C61    PLAN:           Schedule Return Visit/Planned Activity: Keep Scheduled Appointment          Document Letter(s):  Created for Patient: Clinical Summary         Notes:   1. Low risk, but high volume prostate cancer: I had a detailed discussion with Rodney Watkins and his wife today. He has been very well-informed through his prior discussions with Dr. Risa Grill and has elected to proceed with surgical treatment. This appears to be quite appropriate  considering his life expectancy and the higher volume of cancer present.   The patient was counseled about the natural history of prostate cancer and the standard treatment options that are available for prostate cancer. It was explained to him how his age and life expectancy, clinical stage, Gleason score, and PSA affect his prognosis, the decision to proceed with additional staging studies, as well as how that information influences recommended treatment strategies. We discussed the roles for active surveillance, radiation therapy, surgical therapy, androgen deprivation, as well as ablative therapy options for the treatment of prostate cancer as appropriate to his individual cancer situation. We discussed the risks and benefits of these options with regard to their impact on cancer control and also in terms of potential adverse events, complications, and impact on quality of life particularly related to urinary and sexual function. The patient was encouraged to ask questions throughout the discussion today and all questions were  answered to his stated satisfaction. In addition, the patient was provided with and/or directed to appropriate resources and literature for further education about prostate cancer and treatment options.   We discussed surgical therapy for prostate cancer including the different available surgical approaches. We discussed, in detail, the risks and expectations of surgery with regard to cancer control, urinary control, and erectile function as well as the expected postoperative recovery process. Additional risks of surgery including but not limited to bleeding, infection, hernia formation, nerve damage, lymphocele formation, bowel/rectal injury potentially necessitating colostomy, damage to the urinary tract resulting in urine leakage, urethral stricture, and the cardiopulmonary risks such as myocardial infarction, stroke, death, venothromboembolism, etc. were explained. The risk of open surgical conversion for robotic/laparoscopic prostatectomy was also discussed.    He is scheduled to undergo a bilateral nerve sparing robot-assisted laparoscopic radical prostatectomy and will also undergo a bilateral pelvic lymphadenectomy based on his nomogram percentage risk of lymph node involvement.   Cc: Dr. Rana Snare  Dr. Shawnie Dapper

## 2016-08-06 ENCOUNTER — Ambulatory Visit (HOSPITAL_COMMUNITY): Payer: Commercial Managed Care - HMO | Admitting: Anesthesiology

## 2016-08-06 ENCOUNTER — Observation Stay (HOSPITAL_COMMUNITY)
Admission: RE | Admit: 2016-08-06 | Discharge: 2016-08-07 | Disposition: A | Discharge status: Home / Self Care | Payer: Commercial Managed Care - HMO | Source: Ambulatory Visit | Attending: Urology | Admitting: Urology

## 2016-08-06 ENCOUNTER — Encounter (HOSPITAL_COMMUNITY)
Admission: RE | Disposition: A | Discharge status: Home / Self Care | Payer: Self-pay | Source: Ambulatory Visit | Attending: Urology

## 2016-08-06 ENCOUNTER — Encounter (HOSPITAL_COMMUNITY): Payer: Self-pay | Admitting: *Deleted

## 2016-08-06 DIAGNOSIS — C61 Malignant neoplasm of prostate: Secondary | ICD-10-CM | POA: Diagnosis not present

## 2016-08-06 DIAGNOSIS — Z87891 Personal history of nicotine dependence: Secondary | ICD-10-CM | POA: Insufficient documentation

## 2016-08-06 HISTORY — PX: LYMPHADENECTOMY: SHX5960

## 2016-08-06 HISTORY — PX: ROBOT ASSISTED LAPAROSCOPIC RADICAL PROSTATECTOMY: SHX5141

## 2016-08-06 LAB — TYPE AND SCREEN
ABO/RH(D): A POS
Antibody Screen: NEGATIVE

## 2016-08-06 LAB — HEMOGLOBIN AND HEMATOCRIT, BLOOD
HCT: 43 % (ref 39.0–52.0)
Hemoglobin: 14.4 g/dL (ref 13.0–17.0)

## 2016-08-06 SURGERY — XI ROBOTIC ASSISTED LAPAROSCOPIC RADICAL PROSTATECTOMY LEVEL 2
Anesthesia: General

## 2016-08-06 MED ORDER — EPHEDRINE SULFATE 50 MG/ML IJ SOLN
INTRAMUSCULAR | Status: DC | PRN
  Administered 2016-08-06: 10 mg via INTRAVENOUS

## 2016-08-06 MED ORDER — SUGAMMADEX SODIUM 200 MG/2ML IV SOLN
INTRAVENOUS | Status: AC
  Filled 2016-08-06: qty 2

## 2016-08-06 MED ORDER — EPHEDRINE 5 MG/ML INJ
INTRAVENOUS | Status: AC
  Filled 2016-08-06: qty 10

## 2016-08-06 MED ORDER — LACTATED RINGERS IV SOLN
INTRAVENOUS | Status: DC | PRN
  Administered 2016-08-06: 1 mL

## 2016-08-06 MED ORDER — MIDAZOLAM HCL 2 MG/2ML IJ SOLN
INTRAMUSCULAR | Status: AC
  Filled 2016-08-06: qty 2

## 2016-08-06 MED ORDER — ONDANSETRON HCL 4 MG/2ML IJ SOLN
INTRAMUSCULAR | Status: DC | PRN
  Administered 2016-08-06: 4 mg via INTRAVENOUS

## 2016-08-06 MED ORDER — KETOROLAC TROMETHAMINE 30 MG/ML IJ SOLN
INTRAMUSCULAR | Status: AC
  Filled 2016-08-06: qty 1

## 2016-08-06 MED ORDER — HYDROCODONE-ACETAMINOPHEN 5-325 MG PO TABS: 1.0000 | ORAL_TABLET | Freq: Four times a day (QID) | ORAL | 0 refills | Status: DC | PRN

## 2016-08-06 MED ORDER — MORPHINE SULFATE (PF) 4 MG/ML IV SOLN
2.0000 mg | INTRAVENOUS | Status: DC | PRN
  Administered 2016-08-07: 2 mg via INTRAVENOUS
  Filled 2016-08-06: qty 1

## 2016-08-06 MED ORDER — DOCUSATE SODIUM 100 MG PO CAPS
100.0000 mg | ORAL_CAPSULE | Freq: Two times a day (BID) | ORAL | Status: DC
  Administered 2016-08-06 – 2016-08-07 (×2): 100 mg via ORAL
  Filled 2016-08-06 (×2): qty 1

## 2016-08-06 MED ORDER — ROCURONIUM BROMIDE 50 MG/5ML IV SOSY
PREFILLED_SYRINGE | INTRAVENOUS | Status: AC
  Filled 2016-08-06: qty 10

## 2016-08-06 MED ORDER — CEFAZOLIN SODIUM-DEXTROSE 2-4 GM/100ML-% IV SOLN
2.0000 g | INTRAVENOUS | Status: AC
  Administered 2016-08-06: 2 g via INTRAVENOUS
  Filled 2016-08-06: qty 100

## 2016-08-06 MED ORDER — KETOROLAC TROMETHAMINE 30 MG/ML IJ SOLN
INTRAMUSCULAR | Status: DC | PRN
  Administered 2016-08-06: 30 mg via INTRAVENOUS

## 2016-08-06 MED ORDER — HEPARIN SODIUM (PORCINE) 1000 UNIT/ML IJ SOLN
INTRAMUSCULAR | Status: AC
  Filled 2016-08-06: qty 1

## 2016-08-06 MED ORDER — ROCURONIUM BROMIDE 50 MG/5ML IV SOSY
PREFILLED_SYRINGE | INTRAVENOUS | Status: DC | PRN
  Administered 2016-08-06: 10 mg via INTRAVENOUS
  Administered 2016-08-06: 45 mg via INTRAVENOUS
  Administered 2016-08-06: 5 mg via INTRAVENOUS
  Administered 2016-08-06: 20 mg via INTRAVENOUS
  Administered 2016-08-06: 10 mg via INTRAVENOUS

## 2016-08-06 MED ORDER — HYDROMORPHONE HCL 1 MG/ML IJ SOLN
INTRAMUSCULAR | Status: DC | PRN
  Administered 2016-08-06 (×3): .2 mg via INTRAVENOUS

## 2016-08-06 MED ORDER — LIDOCAINE 2% (20 MG/ML) 5 ML SYRINGE
INTRAMUSCULAR | Status: DC | PRN
  Administered 2016-08-06: 80 mg via INTRAVENOUS
  Administered 2016-08-06: 20 mg via INTRAVENOUS

## 2016-08-06 MED ORDER — MEPERIDINE HCL 50 MG/ML IJ SOLN: 6.2500 mg | INTRAMUSCULAR | Status: DC | PRN

## 2016-08-06 MED ORDER — LIDOCAINE 2% (20 MG/ML) 5 ML SYRINGE
INTRAMUSCULAR | Status: AC
  Filled 2016-08-06: qty 5

## 2016-08-06 MED ORDER — SODIUM CHLORIDE 0.9 % IJ SOLN
INTRAMUSCULAR | Status: AC
  Filled 2016-08-06: qty 10

## 2016-08-06 MED ORDER — ONDANSETRON HCL 4 MG/2ML IJ SOLN
INTRAMUSCULAR | Status: AC
  Filled 2016-08-06: qty 2

## 2016-08-06 MED ORDER — METOCLOPRAMIDE HCL 5 MG/ML IJ SOLN: 10.0000 mg | Freq: Once | INTRAMUSCULAR | Status: DC | PRN

## 2016-08-06 MED ORDER — PROPOFOL 10 MG/ML IV BOLUS
INTRAVENOUS | Status: AC
  Filled 2016-08-06: qty 20

## 2016-08-06 MED ORDER — KETOROLAC TROMETHAMINE 15 MG/ML IJ SOLN
15.0000 mg | Freq: Four times a day (QID) | INTRAMUSCULAR | Status: DC
Start: 2016-08-06 — End: 2016-08-07
  Administered 2016-08-06 – 2016-08-07 (×2): 15 mg via INTRAVENOUS
  Filled 2016-08-06 (×2): qty 1

## 2016-08-06 MED ORDER — DIPHENHYDRAMINE HCL 12.5 MG/5ML PO ELIX: 12.5000 mg | ORAL_SOLUTION | Freq: Four times a day (QID) | ORAL | Status: DC | PRN

## 2016-08-06 MED ORDER — BUPIVACAINE HCL (PF) 0.25 % IJ SOLN
INTRAMUSCULAR | Status: AC
  Filled 2016-08-06: qty 30

## 2016-08-06 MED ORDER — KCL IN DEXTROSE-NACL 20-5-0.45 MEQ/L-%-% IV SOLN
INTRAVENOUS | Status: DC
  Administered 2016-08-06: 17:00:00 via INTRAVENOUS
  Filled 2016-08-06 (×2): qty 1000

## 2016-08-06 MED ORDER — DEXAMETHASONE SODIUM PHOSPHATE 10 MG/ML IJ SOLN
INTRAMUSCULAR | Status: AC
  Filled 2016-08-06: qty 1

## 2016-08-06 MED ORDER — SUCCINYLCHOLINE CHLORIDE 200 MG/10ML IV SOSY
PREFILLED_SYRINGE | INTRAVENOUS | Status: AC
  Filled 2016-08-06: qty 10

## 2016-08-06 MED ORDER — SODIUM CHLORIDE 0.9 % IR SOLN
Status: DC | PRN
  Administered 2016-08-06: 1000 mL via INTRAVESICAL

## 2016-08-06 MED ORDER — ONDANSETRON HCL 4 MG/2ML IJ SOLN
4.0000 mg | Freq: Four times a day (QID) | INTRAMUSCULAR | Status: DC | PRN
  Administered 2016-08-06: 4 mg via INTRAVENOUS
  Filled 2016-08-06: qty 2

## 2016-08-06 MED ORDER — HYDROMORPHONE HCL 1 MG/ML IJ SOLN
INTRAMUSCULAR | Status: AC
  Administered 2016-08-06: 0.5 mg via INTRAVENOUS
  Filled 2016-08-06: qty 1

## 2016-08-06 MED ORDER — DIPHENHYDRAMINE HCL 50 MG/ML IJ SOLN: 12.5000 mg | Freq: Four times a day (QID) | INTRAMUSCULAR | Status: DC | PRN

## 2016-08-06 MED ORDER — BUPIVACAINE HCL (PF) 0.25 % IJ SOLN
INTRAMUSCULAR | Status: DC | PRN
  Administered 2016-08-06: 26 mL

## 2016-08-06 MED ORDER — SULFAMETHOXAZOLE-TRIMETHOPRIM 800-160 MG PO TABS: 1.0000 | ORAL_TABLET | Freq: Two times a day (BID) | ORAL | 0 refills | Status: DC

## 2016-08-06 MED ORDER — BELLADONNA ALKALOIDS-OPIUM 16.2-60 MG RE SUPP
1.0000 | Freq: Four times a day (QID) | RECTAL | Status: DC | PRN
  Administered 2016-08-06: 1 via RECTAL
  Filled 2016-08-06: qty 1

## 2016-08-06 MED ORDER — LACTATED RINGERS IV SOLN
INTRAVENOUS | Status: DC
  Administered 2016-08-06 (×2): via INTRAVENOUS

## 2016-08-06 MED ORDER — HYDROMORPHONE HCL 1 MG/ML IJ SOLN
0.2500 mg | INTRAMUSCULAR | Status: DC | PRN
  Administered 2016-08-06 (×4): 0.5 mg via INTRAVENOUS

## 2016-08-06 MED ORDER — ACETAMINOPHEN 325 MG PO TABS: 650.0000 mg | ORAL_TABLET | ORAL | Status: DC | PRN

## 2016-08-06 MED ORDER — LACTATED RINGERS IV SOLN: INTRAVENOUS | Status: DC

## 2016-08-06 MED ORDER — FENTANYL CITRATE (PF) 250 MCG/5ML IJ SOLN
INTRAMUSCULAR | Status: DC | PRN
  Administered 2016-08-06 (×5): 50 ug via INTRAVENOUS

## 2016-08-06 MED ORDER — HYDROMORPHONE HCL 2 MG/ML IJ SOLN
INTRAMUSCULAR | Status: AC
  Filled 2016-08-06: qty 1

## 2016-08-06 MED ORDER — FENTANYL CITRATE (PF) 100 MCG/2ML IJ SOLN
INTRAMUSCULAR | Status: AC
  Administered 2016-08-06: 50 ug via INTRAVENOUS
  Filled 2016-08-06: qty 2

## 2016-08-06 MED ORDER — LORATADINE 10 MG PO TABS
10.0000 mg | ORAL_TABLET | Freq: Every day | ORAL | Status: DC
  Administered 2016-08-06: 10 mg via ORAL
  Filled 2016-08-06 (×2): qty 1

## 2016-08-06 MED ORDER — FENTANYL CITRATE (PF) 250 MCG/5ML IJ SOLN
INTRAMUSCULAR | Status: AC
  Filled 2016-08-06: qty 5

## 2016-08-06 MED ORDER — PROPOFOL 10 MG/ML IV BOLUS
INTRAVENOUS | Status: DC | PRN
  Administered 2016-08-06: 200 mg via INTRAVENOUS

## 2016-08-06 MED ORDER — FENTANYL CITRATE (PF) 100 MCG/2ML IJ SOLN
25.0000 ug | INTRAMUSCULAR | Status: DC | PRN
  Administered 2016-08-06 (×2): 50 ug via INTRAVENOUS

## 2016-08-06 MED ORDER — SODIUM CHLORIDE 0.9 % IV BOLUS (SEPSIS)
1000.0000 mL | Freq: Once | INTRAVENOUS | Status: AC
  Administered 2016-08-06: 1000 mL via INTRAVENOUS

## 2016-08-06 MED ORDER — DEXAMETHASONE SODIUM PHOSPHATE 10 MG/ML IJ SOLN
INTRAMUSCULAR | Status: DC | PRN
  Administered 2016-08-06: 10 mg via INTRAVENOUS

## 2016-08-06 MED ORDER — CEFAZOLIN IN D5W 1 GM/50ML IV SOLN
1.0000 g | Freq: Three times a day (TID) | INTRAVENOUS | Status: AC
  Administered 2016-08-06 – 2016-08-07 (×2): 1 g via INTRAVENOUS
  Filled 2016-08-06 (×2): qty 50

## 2016-08-06 MED ORDER — SUGAMMADEX SODIUM 200 MG/2ML IV SOLN
INTRAVENOUS | Status: DC | PRN
  Administered 2016-08-06: 200 mg via INTRAVENOUS

## 2016-08-06 MED ORDER — MIDAZOLAM HCL 2 MG/2ML IJ SOLN
INTRAMUSCULAR | Status: DC | PRN
  Administered 2016-08-06 (×2): 1 mg via INTRAVENOUS

## 2016-08-06 SURGICAL SUPPLY — 54 items
ADH SKN CLS APL DERMABOND .7 (GAUZE/BANDAGES/DRESSINGS) ×2
APPLICATOR COTTON TIP 6IN STRL (MISCELLANEOUS) ×3 IMPLANT
CATH FOLEY 2WAY SLVR 18FR 30CC (CATHETERS) ×3 IMPLANT
CATH ROBINSON RED A/P 16FR (CATHETERS) ×3 IMPLANT
CATH ROBINSON RED A/P 8FR (CATHETERS) ×3 IMPLANT
CATH TIEMANN FOLEY 18FR 5CC (CATHETERS) ×3 IMPLANT
CHLORAPREP W/TINT 26ML (MISCELLANEOUS) ×3 IMPLANT
CLIP LIGATING HEM O LOK PURPLE (MISCELLANEOUS) ×6 IMPLANT
COVER SURGICAL LIGHT HANDLE (MISCELLANEOUS) ×3 IMPLANT
COVER TIP SHEARS 8 DVNC (MISCELLANEOUS) ×2 IMPLANT
COVER TIP SHEARS 8MM DA VINCI (MISCELLANEOUS) ×1
CUTTER ECHEON FLEX ENDO 45 340 (ENDOMECHANICALS) ×3 IMPLANT
DECANTER SPIKE VIAL GLASS SM (MISCELLANEOUS) ×2 IMPLANT
DERMABOND ADVANCED (GAUZE/BANDAGES/DRESSINGS) ×1
DERMABOND ADVANCED .7 DNX12 (GAUZE/BANDAGES/DRESSINGS) IMPLANT
DRAPE ARM DVNC X/XI (DISPOSABLE) ×8 IMPLANT
DRAPE COLUMN DVNC XI (DISPOSABLE) ×2 IMPLANT
DRAPE DA VINCI XI ARM (DISPOSABLE) ×4
DRAPE DA VINCI XI COLUMN (DISPOSABLE) ×1
DRAPE SURG IRRIG POUCH 19X23 (DRAPES) ×3 IMPLANT
DRSG TEGADERM 4X4.75 (GAUZE/BANDAGES/DRESSINGS) ×3 IMPLANT
ELECT REM PT RETURN 15FT ADLT (MISCELLANEOUS) ×3 IMPLANT
GLOVE BIO SURGEON STRL SZ 6.5 (GLOVE) ×3 IMPLANT
GLOVE BIOGEL M STRL SZ7.5 (GLOVE) ×6 IMPLANT
GOWN STRL REUS W/TWL LRG LVL3 (GOWN DISPOSABLE) ×9 IMPLANT
HOLDER FOLEY CATH W/STRAP (MISCELLANEOUS) ×3 IMPLANT
IRRIG SUCT STRYKERFLOW 2 WTIP (MISCELLANEOUS) ×3
IRRIGATION SUCT STRKRFLW 2 WTP (MISCELLANEOUS) ×2 IMPLANT
IV LACTATED RINGERS 1000ML (IV SOLUTION) ×3 IMPLANT
NDL SAFETY ECLIPSE 18X1.5 (NEEDLE) ×2 IMPLANT
NEEDLE HYPO 18GX1.5 SHARP (NEEDLE) ×3
PACK ROBOT UROLOGY CUSTOM (CUSTOM PROCEDURE TRAY) ×3 IMPLANT
RELOAD GREEN ECHELON 45 (STAPLE) ×3 IMPLANT
SEAL CANN UNIV 5-8 DVNC XI (MISCELLANEOUS) ×8 IMPLANT
SEAL XI 5MM-8MM UNIVERSAL (MISCELLANEOUS) ×4
SOLUTION ELECTROLUBE (MISCELLANEOUS) ×3 IMPLANT
SUT ETHILON 3 0 PS 1 (SUTURE) ×3 IMPLANT
SUT MNCRL 3 0 RB1 (SUTURE) ×2 IMPLANT
SUT MNCRL 3 0 VIOLET RB1 (SUTURE) ×2 IMPLANT
SUT MNCRL AB 4-0 PS2 18 (SUTURE) ×6 IMPLANT
SUT MONOCRYL 3 0 RB1 (SUTURE) ×2
SUT VIC AB 0 CT1 27 (SUTURE) ×3
SUT VIC AB 0 CT1 27XBRD ANTBC (SUTURE) ×2 IMPLANT
SUT VIC AB 0 UR5 27 (SUTURE) ×3 IMPLANT
SUT VIC AB 2-0 SH 27 (SUTURE) ×3
SUT VIC AB 2-0 SH 27X BRD (SUTURE) ×2 IMPLANT
SUT VIC AB 3-0 SH 27 (SUTURE) ×3
SUT VIC AB 3-0 SH 27X BRD (SUTURE) IMPLANT
SUT VICRYL 0 UR6 27IN ABS (SUTURE) ×6 IMPLANT
SYR 27GX1/2 1ML LL SAFETY (SYRINGE) ×3 IMPLANT
TOWEL OR 17X26 10 PK STRL BLUE (TOWEL DISPOSABLE) ×3 IMPLANT
TOWEL OR NON WOVEN STRL DISP B (DISPOSABLE) ×3 IMPLANT
TUBING INSUFFLATION 10FT LAP (TUBING) IMPLANT
WATER STERILE IRR 1000ML POUR (IV SOLUTION) ×1 IMPLANT

## 2016-08-06 NOTE — Progress Notes (Signed)
Patient ID: Rodney Watkins, male   DOB: July 11, 1955, 61 y.o.   MRN: [id number]   Post-op note  Subjective: The patient is doing well.  No complaints.  Objective: Vital signs in last 24 hours: Temp:  [97.5 F (36.4 C)-98.1 F (36.7 C)] 97.5 F (36.4 C) (03/29 1544) Pulse Rate:  [78-91] 87 (03/29 1544) Resp:  [11-18] 14 (03/29 1544) BP: (139-163)/(76-107) 146/76 (03/29 1544) SpO2:  [96 %-100 %] 98 % (03/29 1544) Weight:  [96.6 kg (213 lb)] 96.6 kg (213 lb) (03/29 0928)  Intake/Output from previous day: No intake/output data recorded. Intake/Output this shift: No intake/output data recorded.  Physical Exam:  General: Alert and oriented. Abdomen: Soft, Nondistended. Incisions: Clean and dry. GU: Urine clearing.  Lab Results:  Recent Labs  08/04/16 1330 08/06/16 1446  HGB 14.3 14.4  HCT 42.3 43.0    Assessment/Plan: POD#0   1) Continue to monitor, ambulate, IS   Rodney Watkins. MD   LOS: 0 days   Rodney Watkins,LES 08/06/2016, 8:04 PM

## 2016-08-06 NOTE — Transfer of Care (Signed)
Immediate Anesthesia Transfer of Care Note  Patient: Rodney Watkins  Procedure(s) Performed: Procedure(s): XI ROBOTIC ASSISTED LAPAROSCOPIC RADICAL PROSTATECTOMY LEVEL 2 (N/A) PELVIC LYMPHADENECTOMY (Bilateral)  Patient Location: PACU  Anesthesia Type:General  Level of Consciousness: awake, alert , oriented and patient cooperative  Airway & Oxygen Therapy: Patient Spontanous Breathing and Patient connected to face mask oxygen  Post-op Assessment: Report given to RN and Post -op Vital signs reviewed and stable  Post vital signs: Reviewed and stable  Last Vitals:  Vitals:   08/06/16 0913  BP: (!) 139/95  Pulse: 78  Resp: 16  Temp: 36.7 C    Last Pain:  Vitals:   08/06/16 0928  TempSrc:   PainSc: 0-No pain      Patients Stated Pain Goal: 3 (78/93/81 0175)  Complications: No apparent anesthesia complications

## 2016-08-06 NOTE — Discharge Instructions (Signed)

## 2016-08-06 NOTE — Anesthesia Postprocedure Evaluation (Signed)
Anesthesia Post Note  Patient: Rodney Watkins  Procedure(s) Performed: Procedure(s) (LRB): XI ROBOTIC ASSISTED LAPAROSCOPIC RADICAL PROSTATECTOMY LEVEL 2 (N/A) PELVIC LYMPHADENECTOMY (Bilateral)  Patient location during evaluation: PACU Anesthesia Type: General Level of consciousness: awake and alert Pain management: pain level controlled Vital Signs Assessment: post-procedure vital signs reviewed and stable Respiratory status: spontaneous breathing, nonlabored ventilation, respiratory function stable and patient connected to nasal cannula oxygen Cardiovascular status: blood pressure returned to baseline and stable Postop Assessment: no signs of nausea or vomiting Anesthetic complications: no       Last Vitals:  Vitals:   08/06/16 1530 08/06/16 1544  BP: 139/81 (!) 146/76  Pulse: 85 87  Resp: 12 14  Temp: 36.4 C 36.4 C    Last Pain:  Vitals:   08/06/16 1625  TempSrc:   PainSc: 3                  Montez Hageman

## 2016-08-06 NOTE — Anesthesia Procedure Notes (Addendum)
Procedure Name: Intubation Date/Time: 08/06/2016 11:43 AM Performed by: Dione Booze Pre-anesthesia Checklist: Patient identified, Emergency Drugs available, Suction available and Patient being monitored Patient Re-evaluated:Patient Re-evaluated prior to inductionOxygen Delivery Method: Circle system utilized Preoxygenation: Pre-oxygenation with 100% oxygen Intubation Type: IV induction Ventilation: Mask ventilation without difficulty Laryngoscope Size: Mac and 4 Grade View: Grade II Tube type: Oral Tube size: 7.5 mm Number of attempts: 1 Airway Equipment and Method: Stylet Placement Confirmation: ETT inserted through vocal cords under direct vision,  positive ETCO2 and breath sounds checked- equal and bilateral Secured at: 22 cm Tube secured with: Tape Dental Injury: Teeth and Oropharynx as per pre-operative assessment  Comments: Intubated by Laurey Arrow, SRNA

## 2016-08-06 NOTE — Op Note (Signed)
Preoperative diagnosis: Clinically localized adenocarcinoma of the prostate (clinical stage T1c Nx Mx)  Postoperative diagnosis: Clinically localized adenocarcinoma of the prostate (clinical stage T1c Nx Mx)  Procedure:  1. Robotic assisted laparoscopic radical prostatectomy (bilateral nerve sparing) 2. Bilateral robotic assisted laparoscopic pelvic lymphadenectomy  Surgeon: Pryor Curia. M.D.  Assistant: Debbrah Alar, PA-C  An assistant was required for this surgical procedure.  The duties of the assistant included but were not limited to suctioning, passing suture, camera manipulation, retraction. This procedure would not be able to be performed without an Environmental consultant.  Anesthesia: General  Complications: None  EBL: 200 mL  IVF:  1200 mL crystalloid  Specimens: 1. Prostate and seminal vesicles 2. Right pelvic lymph nodes 3. Left pelvic lymph nodes  Disposition of specimens: Pathology  Drains: 1. 20 Fr coude catheter 2. # 19 Blake pelvic drain  Indication: Rodney Watkins is a 61 y.o. year old patient with clinically localized prostate cancer.  After a thorough review of the management options for treatment of prostate cancer, he elected to proceed with surgical therapy and the above procedure(s).  We have discussed the potential benefits and risks of the procedure, side effects of the proposed treatment, the likelihood of the patient achieving the goals of the procedure, and any potential problems that might occur during the procedure or recuperation. Informed consent has been obtained.  Description of procedure:  The patient was taken to the operating room and a general anesthetic was administered. He was given preoperative antibiotics, placed in the dorsal lithotomy position, and prepped and draped in the usual sterile fashion. Next a preoperative timeout was performed. A urethral catheter was placed into the bladder and a site was selected near the umbilicus for  placement of the camera port. This was placed using a standard open Hassan technique which allowed entry into the peritoneal cavity under direct vision and without difficulty. An 8 mm robotic port was placed and a pneumoperitoneum established. The camera was then used to inspect the abdomen and there was no evidence of any intra-abdominal injuries or other abnormalities. The remaining abdominal ports were then placed. 8 mm robotic ports were placed in the right lower quadrant, left lower quadrant, and far left lateral abdominal wall. A 5 mm port was placed in the right upper quadrant and a 12 mm port was placed in the right lateral abdominal wall for laparoscopic assistance. All ports were placed under direct vision without difficulty. The surgical cart was then docked.   Utilizing the cautery scissors, the bladder was reflected posteriorly allowing entry into the space of Retzius and identification of the endopelvic fascia and prostate. The periprostatic fat was then removed from the prostate allowing full exposure of the endopelvic fascia. The endopelvic fascia was then incised from the apex back to the base of the prostate bilaterally and the underlying levator muscle fibers were swept laterally off the prostate thereby isolating the dorsal venous complex. The dorsal vein was then stapled and divided with a 45 mm Flex Echelon stapler. Attention then turned to the bladder neck which was divided anteriorly thereby allowing entry into the bladder and exposure of the urethral catheter. The catheter balloon was deflated and the catheter was brought into the operative field and used to retract the prostate anteriorly. The posterior bladder neck was then examined and was divided allowing further dissection between the bladder and prostate posteriorly until the vasa deferentia and seminal vessels were identified. The vasa deferentia were isolated, divided, and lifted  anteriorly. The seminal vesicles were dissected down  to their tips with care to control the seminal vascular arterial blood supply. These structures were then lifted anteriorly and the space between Denonvillier's fascia and the anterior rectum was developed with a combination of sharp and blunt dissection. This isolated the vascular pedicles of the prostate.  The lateral prostatic fascia was then sharply incised allowing release of the neurovascular bundles bilaterally. The vascular pedicles of the prostate were then ligated with Weck clips between the prostate and neurovascular bundles and divided with sharp cold scissor dissection resulting in neurovascular bundle preservation. The neurovascular bundles were then separated off the apex of the prostate and urethra bilaterally.  The urethra was then sharply transected allowing the prostate specimen to be disarticulated. The pelvis was copiously irrigated and hemostasis was ensured. There was no evidence for rectal injury.  Attention then turned to the right pelvic sidewall. The fibrofatty tissue between the external iliac vein, confluence of the iliac vessels, hypogastric artery, and Cooper's ligament was dissected free from the pelvic sidewall with care to preserve the obturator nerve. Weck clips were used for lymphostasis and hemostasis. An identical procedure was performed on the contralateral side and the lymphatic packets were removed for permanent pathologic analysis.  Attention then turned to the urethral anastomosis. A 2-0 Vicryl slip knot was placed between Denonvillier's fascia, the posterior bladder neck, and the posterior urethra to reapproximate these structures. A double-armed 3-0 Monocryl suture was then used to perform a 360 running tension-free anastomosis between the bladder neck and urethra. A new urethral catheter was then placed into the bladder and irrigated. There were no blood clots within the bladder and the anastomosis appeared to be watertight. A #19 Blake drain was then brought  through the left lateral 8 mm port site and positioned appropriately within the pelvis. It was secured to the skin with a nylon suture. The surgical cart was then undocked. The right lateral 12 mm port site was closed at the fascial level with a 0 Vicryl suture placed laparoscopically. All remaining ports were then removed under direct vision. The prostate specimen was removed intact within the Endopouch retrieval bag via the periumbilical camera port site. This fascial opening was closed with two running 0 Vicryl sutures. 0.25% Marcaine was then injected into all port sites and all incisions were reapproximated at the skin level with 4-0 Monocryl subcuticular sutures and Liquiband. The patient appeared to tolerate the procedure well and without complications. The patient was able to be extubated and transferred to the recovery unit in satisfactory condition.   Pryor Curia MD

## 2016-08-06 NOTE — Interval H&P Note (Signed)
History and Physical Interval Note:  08/06/2016 10:45 AM  Rodney Watkins  has presented today for surgery, with the diagnosis of PROSTATE CANCER  The various methods of treatment have been discussed with the patient and family. After consideration of risks, benefits and other options for treatment, the patient has consented to  Procedure(s): XI ROBOTIC ASSISTED LAPAROSCOPIC RADICAL PROSTATECTOMY LEVEL 2 (N/A) PELVIC LYMPHADENECTOMY (Bilateral) as a surgical intervention .  The patient's history has been reviewed, patient examined, no change in status, stable for surgery.  I have reviewed the patient's chart and labs.  Questions were answered to the patient's satisfaction.     [name],LES

## 2016-08-06 NOTE — Anesthesia Preprocedure Evaluation (Signed)
Anesthesia Evaluation  Patient identified by MRN, date of birth, ID band Patient awake    Reviewed: Allergy & Precautions, NPO status , Patient's Chart, lab work & pertinent test results  Airway Mallampati: II  TM Distance: >3 FB Neck ROM: Full    Dental no notable dental hx.    Pulmonary neg pulmonary ROS, former smoker,    Pulmonary exam normal breath sounds clear to auscultation       Cardiovascular negative cardio ROS Normal cardiovascular exam Rhythm:Regular Rate:Normal     Neuro/Psych negative neurological ROS  negative psych ROS   GI/Hepatic negative GI ROS, Neg liver ROS,   Endo/Other  negative endocrine ROS  Renal/GU negative Renal ROS  negative genitourinary   Musculoskeletal negative musculoskeletal ROS (+)   Abdominal   Peds negative pediatric ROS (+)  Hematology negative hematology ROS (+)   Anesthesia Other Findings   Reproductive/Obstetrics negative OB ROS                             Anesthesia Physical Anesthesia Plan  ASA: II  Anesthesia Plan: General   Post-op Pain Management:    Induction: Intravenous  Airway Management Planned: Oral ETT  Additional Equipment:   Intra-op Plan:   Post-operative Plan: Extubation in OR  Informed Consent: I have reviewed the patients History and Physical, chart, labs and discussed the procedure including the risks, benefits and alternatives for the proposed anesthesia with the patient or authorized representative who has indicated his/her understanding and acceptance.   Dental advisory given  Plan Discussed with: CRNA  Anesthesia Plan Comments:         Anesthesia Quick Evaluation

## 2016-08-06 NOTE — Progress Notes (Signed)
JP drain cap came open when patient moved in be. All output spilt on pad and sheet. Unable to record output.

## 2016-08-07 ENCOUNTER — Encounter (HOSPITAL_COMMUNITY): Payer: Self-pay | Admitting: Urology

## 2016-08-07 DIAGNOSIS — C61 Malignant neoplasm of prostate: Secondary | ICD-10-CM | POA: Diagnosis not present

## 2016-08-07 LAB — HEMOGLOBIN AND HEMATOCRIT, BLOOD
HCT: 35.9 % — ABNORMAL LOW (ref 39.0–52.0)
Hemoglobin: 12.3 g/dL — ABNORMAL LOW (ref 13.0–17.0)

## 2016-08-07 MED ORDER — HYDROCODONE-ACETAMINOPHEN 5-325 MG PO TABS: 1.0000 | ORAL_TABLET | Freq: Four times a day (QID) | ORAL | Status: DC | PRN

## 2016-08-07 MED ORDER — BISACODYL 10 MG RE SUPP
10.0000 mg | Freq: Once | RECTAL | Status: AC
  Administered 2016-08-07: 10 mg via RECTAL
  Filled 2016-08-07: qty 1

## 2016-08-07 NOTE — Progress Notes (Signed)
Patient and wife educated on foley catheter care prior to D/C. JP drain was removed with 10cc of serosangous fluid. Patient tolerated well. [place]

## 2016-08-07 NOTE — Discharge Summary (Signed)
Date of admission: 08/06/2016  Date of discharge: 08/07/2016  Admission diagnosis: Prostate Cancer  Discharge diagnosis: Prostate Cancer  History and Physical: For full details, please see admission history and physical. Briefly, Rodney Watkins is a 60 y.o. gentleman with localized prostate cancer.  After discussing management/treatment options, he elected to proceed with surgical treatment.  Hospital Course: Rodney Watkins was taken to the operating room on 08/06/2016 and underwent a robotic assisted laparoscopic radical prostatectomy. He tolerated this procedure well and without complications. Postoperatively, he was able to be transferred to a regular hospital room following recovery from anesthesia.  He was able to begin ambulating the night of surgery. He remained hemodynamically stable overnight.  He had excellent urine output with appropriately minimal output from his pelvic drain and his pelvic drain was removed on POD #1.  He was transitioned to oral pain medication, tolerated a clear liquid diet, and had met all discharge criteria and was able to be discharged home later on POD#1.  Laboratory values:  Recent Labs  08/04/16 1330 08/06/16 1446 08/07/16 0526  HGB 14.3 14.4 12.3*  HCT 42.3 43.0 35.9*    Disposition: Home  Discharge instruction: He was instructed to be ambulatory but to refrain from heavy lifting, strenuous activity, or driving. He was instructed on urethral catheter care.  Discharge medications:  Allergies as of 08/07/2016   No Known Allergies     Medication List    STOP taking these medications   FISH OIL PO   multivitamin with minerals Tabs tablet   VITAMIN D3 PO     TAKE these medications   acetaminophen 325 MG tablet Commonly known as:  TYLENOL Take 650 mg by mouth daily as needed for moderate pain or headache.   calcium carbonate 500 MG chewable tablet Commonly known as:  TUMS - dosed in mg elemental calcium Chew 1-2 tablets by mouth  daily as needed for indigestion or heartburn.   cetirizine 10 MG tablet Commonly known as:  ZYRTEC Take 10 mg by mouth daily as needed for allergies.   ciprofloxacin 500 MG tablet Commonly known as:  CIPRO Take 500 mg by mouth 2 (two) times daily.   HYDROcodone-acetaminophen 5-325 MG tablet Commonly known as:  NORCO Take 1-2 tablets by mouth every 6 (six) hours as needed for moderate pain or severe pain.   sulfamethoxazole-trimethoprim 800-160 MG tablet Commonly known as:  BACTRIM DS,SEPTRA DS Take 1 tablet by mouth 2 (two) times daily. Start the day prior to foley removal appointment       Followup: He will followup in 1 week for catheter removal and to discuss his surgical pathology results.   

## 2016-08-10 ENCOUNTER — Encounter: Payer: Self-pay | Admitting: Family Medicine

## 2016-12-02 ENCOUNTER — Other Ambulatory Visit: Payer: Self-pay | Admitting: Occupational Medicine

## 2016-12-02 ENCOUNTER — Ambulatory Visit
Admission: RE | Admit: 2016-12-02 | Discharge: 2016-12-02 | Disposition: A | Discharge status: Home / Self Care | Payer: No Typology Code available for payment source | Source: Ambulatory Visit | Attending: Occupational Medicine | Admitting: Occupational Medicine

## 2016-12-02 DIAGNOSIS — Z0000 Encounter for general adult medical examination without abnormal findings: Secondary | ICD-10-CM

## 2017-01-06 LAB — BASIC METABOLIC PANEL
BUN: 13 (ref 4–21)
CREATININE: 0.9 (ref 0.6–1.3)
GLUCOSE: 120
POTASSIUM: 4.4 (ref 3.4–5.3)
SODIUM: 140 (ref 137–147)

## 2017-01-06 LAB — LIPID PANEL
Cholesterol: 217 — AB (ref 0–200)
HDL: 54 (ref 35–70)
LDL Cholesterol: 149
Triglycerides: 72 (ref 40–160)

## 2017-01-06 LAB — CBC AND DIFFERENTIAL
HEMATOCRIT: 46 (ref 41–53)
Hemoglobin: 15.4 (ref 13.5–17.5)
Neutrophils Absolute: 3
Platelets: 275 (ref 150–399)
WBC: 5.2

## 2017-01-06 LAB — HEPATIC FUNCTION PANEL
ALT: 33 (ref 10–40)
AST: 45 — AB (ref 14–40)
Alkaline Phosphatase: 60 (ref 25–125)
BILIRUBIN, TOTAL: 0.5

## 2017-01-06 LAB — TSH: TSH: 2.13 (ref 0.41–5.90)

## 2017-02-23 ENCOUNTER — Encounter: Payer: Self-pay | Admitting: Family Medicine

## 2017-03-05 ENCOUNTER — Other Ambulatory Visit: Payer: Self-pay

## 2017-05-11 DIAGNOSIS — K432 Incisional hernia without obstruction or gangrene: Secondary | ICD-10-CM

## 2017-05-11 HISTORY — DX: Incisional hernia without obstruction or gangrene: K43.2

## 2017-05-25 ENCOUNTER — Ambulatory Visit (INDEPENDENT_AMBULATORY_CARE_PROVIDER_SITE_OTHER): Payer: 59 | Admitting: Family Medicine

## 2017-05-25 ENCOUNTER — Encounter: Payer: Self-pay | Admitting: Family Medicine

## 2017-05-25 VITALS — BP 137/87 | HR 75 | Temp 97.6°F | Resp 16 | Ht 72.0 in | Wt 209.0 lb

## 2017-05-25 DIAGNOSIS — Z0000 Encounter for general adult medical examination without abnormal findings: Secondary | ICD-10-CM | POA: Diagnosis not present

## 2017-05-25 DIAGNOSIS — Z23 Encounter for immunization: Secondary | ICD-10-CM | POA: Diagnosis not present

## 2017-05-25 DIAGNOSIS — R209 Unspecified disturbances of skin sensation: Secondary | ICD-10-CM | POA: Diagnosis not present

## 2017-05-25 DIAGNOSIS — IMO0001 Reserved for inherently not codable concepts without codable children: Secondary | ICD-10-CM

## 2017-05-25 DIAGNOSIS — R7301 Impaired fasting glucose: Secondary | ICD-10-CM

## 2017-05-25 DIAGNOSIS — K432 Incisional hernia without obstruction or gangrene: Secondary | ICD-10-CM

## 2017-05-25 DIAGNOSIS — M6208 Separation of muscle (nontraumatic), other site: Secondary | ICD-10-CM

## 2017-05-25 DIAGNOSIS — E7800 Pure hypercholesterolemia, unspecified: Secondary | ICD-10-CM | POA: Diagnosis not present

## 2017-05-25 LAB — LIPID PANEL
CHOLESTEROL: 214 mg/dL — AB (ref 0–200)
HDL: 55.6 mg/dL (ref >39.00)
LDL Cholesterol: 142 mg/dL — ABNORMAL HIGH (ref 0–99)
NonHDL: 158.12
TRIGLYCERIDES: 83 mg/dL (ref 0.0–149.0)
Total CHOL/HDL Ratio: 4
VLDL: 16.6 mg/dL (ref 0.0–40.0)

## 2017-05-25 LAB — BASIC METABOLIC PANEL
BUN: 15 mg/dL (ref 6–23)
CHLORIDE: 105 meq/L (ref 96–112)
CO2: 25 meq/L (ref 19–32)
CREATININE: 0.97 mg/dL (ref 0.40–1.50)
Calcium: 10 mg/dL (ref 8.4–10.5)
GFR: 84.03 mL/min (ref >60.00)
Glucose, Bld: 112 mg/dL — ABNORMAL HIGH (ref 70–99)
Potassium: 4.5 mEq/L (ref 3.5–5.1)
Sodium: 139 mEq/L (ref 135–145)

## 2017-05-25 LAB — VITAMIN B12: Vitamin B-12: 410 pg/mL (ref 211–911)

## 2017-05-25 LAB — HEMOGLOBIN A1C: HEMOGLOBIN A1C: 5.7 % (ref 4.6–6.5)

## 2017-05-25 NOTE — Progress Notes (Signed)
Office Note 05/25/2017  CC:  Chief Complaint  Patient presents with  . Annual Exam    HPI:  Rodney Watkins is a 62 y.o. White male who is here for annual health maintenance exam. Retired from [employer], occupational, has some labs from his exit exam today (dated 12/24/16): Fasting glucose 120, otherwise CMET normal.  Uric acid normal. Tchol 217, trigs 72, HDL 54, LDL 149, Tchol/HDL ratio = 4.  CBC w/diff normal, UA normal, thyroid labs normal.  Also, CXR 12/02/16 NORMAL. Reviewed these with pt in detail today, discussed what needs to be followed up on/repeated.  Hx of abd feeling gassy/distended on/off, abd protrusion in midline at times. Lately did some bending with yard work and felt a pop in the region---pt suspects ventral hernia. Eating and drinking fine, BMs normal.  No significant abd pains.   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 He couldn't lift R leg after surgery, PT helped this a lot.  Still with some R thigh atrophy but minimal to no weakness.   Both feet would tingle and ache with this as well.  This gets better with activity.  Unclear whether this sometimes involves other parts of his legs.  He says he is worried about DM/DPN b/c of family hx of DM. Lately feels frequent tingling in both hands---nearly constant.  Says essentially all fingers feel the same, occ extending up arms some.  Wakes up with both arms "completely asleep" and has to shake them out and they return to normal except hands/fingers tingling/numbness continues.  Denies arms/wrists/hands weakness.  No neck pain or shoulder pains.  Eye: exam UTD Dental: preventatives UTD. Exercise: none at this time. Diet: tries to eat healthy.  Past Medical History:  Diagnosis Date  . History of kidney stones    bladder  . Hyperlipidemia    Mild.  No meds in past records.  . IFG (impaired fasting glucose)    noted in old records; needs HbA1c at next  blood draw (as of 04/19/15)  . Lumbar spondylosis    w/pars defect and listhesis (Dr. Saintclair Halsted).  Referred by neurosurg to pain mgmt MD and pt got ESI x 2 ---no help.  . Prostate cancer ([place])    Referred for increased PSA velocity.  Bx 06/17/16 showed gleeson 3+3=6 adenocarcinoma of prostate with 9 of 12 bx cores + for malignancy.  Plan as of 07/29/16 is bilat nerve sparing robot assisted lap radical prostatectomy with bilat pelvic lymphadenectomy.  . Pulmonary nodule 2009   Stable CT chest 2010--no further scans needed in pt at low risk for lung ca.    Past Surgical History:  Procedure Laterality Date  . COLONOSCOPY  04/20/11   Normal: recall 10 yrs  . ett  approx 2010   normal  . KNEE ARTHROSCOPY  2000   left  . LYMPHADENECTOMY Bilateral 08/06/2016   Procedure: PELVIC LYMPHADENECTOMY;  Surgeon: Raynelle Bring, MD;  Location: [facility];  Service: Urology;  Laterality: Bilateral;  . REFRACTIVE SURGERY  2000   both eyes  . ROBOT ASSISTED LAPAROSCOPIC RADICAL PROSTATECTOMY N/A 08/06/2016   Procedure: Path: adenocarcinoma with extraprostatic extension present, negative margins, lymph nodes NEG. XI ROBOTIC ASSISTED LAPAROSCOPIC RADICAL PROSTATECTOMY LEVEL 2;  Surgeon: Raynelle Bring, MD;  Location: [facility];  Service: Urology;  Laterality: N/A;  . ROTATOR CUFF REPAIR  2002   right  . ROTATOR CUFF REPAIR  2003   left  Family History  Problem Relation Age of Onset  . Diabetes Father   . Cancer Sister        unknown  . Colon cancer Neg Hx   . Stomach cancer Neg Hx     Social History   Socioeconomic History  . Marital status: Married    Spouse name: Not on file  . Number of children: Not on file  . Years of education: Not on file  . Highest education level: Not on file  Social Needs  . Financial resource strain: Not on file  . Food insecurity - worry: Not on file  . Food insecurity - inability: Not on file  . Transportation needs - medical: Not on file  . Transportation needs -  non-medical: Not on file  Occupational History  . Not on file  Tobacco Use  . Smoking status: Former Smoker    Last attempt to quit: 04/08/1982    Years since quitting: 35.1  . Smokeless tobacco: Never Used  Substance and Sexual Activity  . Alcohol use: Yes    Alcohol/week: 1.8 oz    Types: 3 Cans of beer per week  . Drug use: No  . Sexual activity: Yes  Other Topics Concern  . Not on file  Social History Narrative   Married, 3 daughters.   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   No T/A/Ds.    Outpatient Medications Prior to Visit  Medication Sig Dispense Refill  . calcium carbonate (TUMS - DOSED IN MG ELEMENTAL CALCIUM) 500 MG chewable tablet Chew 1-2 tablets by mouth daily as needed for indigestion or heartburn.    . cetirizine (ZYRTEC) 10 MG tablet Take 10 mg by mouth daily as needed for allergies.    Marland Kitchen acetaminophen (TYLENOL) 325 MG tablet Take 650 mg by mouth daily as needed for moderate pain or headache.    Marland Kitchen HYDROcodone-acetaminophen (NORCO) 5-325 MG tablet Take 1-2 tablets by mouth every 6 (six) hours as needed for moderate pain or severe pain. (Patient not taking: Reported on 05/25/2017) 30 tablet 0  . sulfamethoxazole-trimethoprim (BACTRIM DS,SEPTRA DS) 800-160 MG tablet Take 1 tablet by mouth 2 (two) times daily. Start the day prior to foley removal appointment (Patient not taking: Reported on 05/25/2017) 6 tablet 0   No facility-administered medications prior to visit.     No Known Allergies  ROS Review of Systems  Constitutional: Negative for appetite change, chills, fatigue and fever.  HENT: Negative for congestion, dental problem, ear pain and sore throat.   Eyes: Negative for discharge, redness and visual disturbance.  Respiratory: Negative for cough, chest tightness, shortness of breath and wheezing.   Cardiovascular: Negative for chest pain, palpitations and leg swelling.  Gastrointestinal: Positive for  abdominal pain (minimal discomfort intermittently). Negative for blood in stool, constipation, diarrhea, nausea, rectal pain and vomiting.  Endocrine: Negative for cold intolerance, heat intolerance, polydipsia, polyphagia and polyuria.  Genitourinary: Negative for difficulty urinating, dysuria, flank pain, frequency, hematuria and urgency.  Musculoskeletal: Negative for arthralgias, back pain, joint swelling, myalgias and neck stiffness.  Skin: Negative for pallor and rash.  Neurological: Positive for numbness (see hpi). Negative for dizziness, speech difficulty, weakness and headaches.  Hematological: Negative for adenopathy. Does not bruise/bleed easily.  Psychiatric/Behavioral: Negative for confusion and sleep disturbance. The patient is not nervous/anxious.     PE; Blood pressure 137/87, pulse 75, temperature 97.6 F (36.4 C), temperature source Oral, resp. rate 16, height 6' (  1.829 m), weight 209 lb (94.8 kg), SpO2 95 %. Body mass index is 28.35 kg/m.  Gen: Alert, well appearing.  Patient is oriented to person, place, time, and situation. AFFECT: pleasant, lucid thought and speech. ENT: Ears: EACs clear, normal epithelium.  TMs with good light reflex and landmarks bilaterally.  Eyes: no injection, icteris, swelling, or exudate.  EOMI, PERRLA. Nose: no drainage or turbinate edema/swelling.  No injection or focal lesion.  Mouth: lips without lesion/swelling.  Oral mucosa pink and moist.  Dentition intact and without obvious caries or gingival swelling.  Oropharynx without erythema, exudate, or swelling.  Neck: supple/nontender.  No LAD, mass, or TM.  Carotid pulses 2+ bilaterally, without bruits. CV: RRR, no m/r/g.   LUNGS: CTA bilat, nonlabored resps, good aeration in all lung fields. ABD: soft, NT, ND, BS normal.  No hepatospenomegaly or mass.  No bruits. When patient is asked to do an abd crunch, he has midline central abdomen protrusion from below umbillicus to subxyphoid region.   Palpation of this reveals a small hernia directly over the 4 cm incision above the umbilicus --from his relatively recent prostate surgery.  Non-tender. EXT: no clubbing, cyanosis, or edema.  Phalen's and Tinel's testing resulted in no change in pt's sensory sx's on either side. No hand/finger muscle waisting.  UE and LE strength 5/5 prox and dist bilat.   No tremor.  Gait normal.   Musculoskeletal: no joint swelling, erythema, warmth, or tenderness.  ROM of all joints intact. Skin - no sores or suspicious lesions or rashes or color changes   Pertinent labs:  No results found for: TSH Lab Results  Component Value Date   WBC 5.5 08/04/2016   HGB 12.3 (L) 08/07/2016   HCT 35.9 (L) 08/07/2016   MCV 89.1 08/04/2016   PLT 262 08/04/2016   Lab Results  Component Value Date   CREATININE 0.97 08/04/2016   BUN 17 08/04/2016   NA 139 08/04/2016   K 4.0 08/04/2016   CL 110 08/04/2016   CO2 24 08/04/2016   No results found for: ALT, AST, GGT, ALKPHOS, BILITOT No results found for: CHOL No results found for: HDL No results found for: LDLCALC No results found for: TRIG No results found for: CHOLHDL No results found for: PSA   ASSESSMENT AND PLAN:   1) Diastasis rectus, with a small incisional hernia. I recommended pt make Dr. Alinda Money aware of this at next f/u. Doubt anything needs to be done about this at the present time.  2) Paresthesias: unclear etiology.  No clear confirmation of suspected compression neuropathy in UEs on exam today. Feet sx's with pain component as well. I will check vit B12 level, but otherwise I recommended pt see a neurologist for further evaluation. He wants to wait a little while and if no spontaneous improvement he'll call for referral.  3) Health maintenance exam: Reviewed age and gender appropriate health maintenance issues (prudent diet, regular exercise, health risks of tobacco and excessive alcohol, use of seatbelts, fire alarms in home, use of  sunscreen).  Also reviewed age and gender appropriate health screening as well as vaccine recommendations. Vaccines:  Flu vaccine--- UTD.  Tdap---today.   Shingrix discussed--#1 today. Labs: fasting lipids, BMET, A1c today (to f/u labs discussed/listed in HPI). Prostate ca screening: n/a--pt with hx of prostate ca and got prostatectomy 07/2016.  Gets routine f/u with urology. Colon ca screening: next colonoscopy due 2022.  An After Visit Summary was printed and given to the patient.  FOLLOW UP:  Return in about 6 months (around 11/22/2017) for routine chronic illness f/u (IFG/Hyperlip)--fasting.  Signed:  Crissie Sickles, MD           05/25/2017

## 2017-05-25 NOTE — Patient Instructions (Signed)

## 2017-05-26 ENCOUNTER — Encounter: Payer: Self-pay | Admitting: Family Medicine

## 2017-05-26 ENCOUNTER — Telehealth: Payer: Self-pay | Admitting: Family Medicine

## 2017-05-26 NOTE — Telephone Encounter (Signed)
Dr Anitra Lauth already made aware via Lab Result note.

## 2017-05-26 NOTE — Telephone Encounter (Signed)
Reviewed Rodney Watkins lab results and physician's note with him. Please inform Dr. Anitra Lauth he does not want to start on cholesterol med at this time. He will try exercise and diet for 3 months and I have scheduled his repeat labs for April 16.

## 2017-05-27 ENCOUNTER — Other Ambulatory Visit: Payer: Self-pay | Admitting: Family Medicine

## 2017-05-27 ENCOUNTER — Encounter: Payer: Self-pay | Admitting: *Deleted

## 2017-05-27 DIAGNOSIS — E7800 Pure hypercholesterolemia, unspecified: Secondary | ICD-10-CM | POA: Insufficient documentation

## 2017-06-29 ENCOUNTER — Encounter: Payer: Self-pay | Admitting: Family Medicine

## 2017-08-24 ENCOUNTER — Encounter: Payer: Self-pay | Admitting: Family Medicine

## 2017-08-24 ENCOUNTER — Telehealth: Payer: Self-pay | Admitting: Family Medicine

## 2017-08-24 ENCOUNTER — Other Ambulatory Visit (INDEPENDENT_AMBULATORY_CARE_PROVIDER_SITE_OTHER): Payer: 59

## 2017-08-24 ENCOUNTER — Other Ambulatory Visit: Payer: Self-pay | Admitting: Family Medicine

## 2017-08-24 DIAGNOSIS — Z23 Encounter for immunization: Secondary | ICD-10-CM | POA: Diagnosis not present

## 2017-08-24 DIAGNOSIS — R202 Paresthesia of skin: Secondary | ICD-10-CM

## 2017-08-24 DIAGNOSIS — E7800 Pure hypercholesterolemia, unspecified: Secondary | ICD-10-CM

## 2017-08-24 NOTE — Addendum Note (Signed)
Addended by: Ralph Dowdy on: 08/24/2017 09:52 AM   Modules accepted: Orders

## 2017-08-24 NOTE — Progress Notes (Signed)
Patient presented for labs, however did not come fasting.  Labs rescheduled.  Patient was due for second Shinrix vaccine and agreed to get vaccine today.  Patient tolerated Shingrix.

## 2017-08-24 NOTE — Telephone Encounter (Signed)
Neurology referral ordered as per pt request.

## 2017-08-24 NOTE — Telephone Encounter (Signed)
Patient was here for nurse visit and wanted to ask about the referral for neurology.  He would like that order placed as his paresthesias is not much better.  Please advise.

## 2017-08-31 ENCOUNTER — Other Ambulatory Visit: Payer: 59

## 2017-09-07 ENCOUNTER — Other Ambulatory Visit (INDEPENDENT_AMBULATORY_CARE_PROVIDER_SITE_OTHER): Payer: 59

## 2017-09-07 DIAGNOSIS — E785 Hyperlipidemia, unspecified: Secondary | ICD-10-CM

## 2017-09-07 DIAGNOSIS — Z23 Encounter for immunization: Secondary | ICD-10-CM

## 2017-09-07 LAB — LIPID PANEL
Cholesterol: 199 mg/dL (ref 0–200)
HDL: 46.3 mg/dL (ref >39.00)
LDL Cholesterol: 142 mg/dL — ABNORMAL HIGH (ref 0–99)
NonHDL: 152.74
TRIGLYCERIDES: 53 mg/dL (ref 0.0–149.0)
Total CHOL/HDL Ratio: 4
VLDL: 10.6 mg/dL (ref 0.0–40.0)

## 2017-09-08 ENCOUNTER — Encounter: Payer: Self-pay | Admitting: Family Medicine

## 2017-09-09 ENCOUNTER — Encounter: Payer: Self-pay | Admitting: Family Medicine

## 2017-09-10 ENCOUNTER — Telehealth: Payer: Self-pay

## 2017-09-10 MED ORDER — ATORVASTATIN CALCIUM 20 MG PO TABS: 20.0000 mg | ORAL_TABLET | Freq: Every day | ORAL | 2 refills | Status: DC

## 2017-09-10 NOTE — Telephone Encounter (Signed)
Medication sent to pharmacy per instructions.

## 2017-09-30 ENCOUNTER — Encounter: Payer: Self-pay | Admitting: Family Medicine

## 2017-11-02 ENCOUNTER — Ambulatory Visit (INDEPENDENT_AMBULATORY_CARE_PROVIDER_SITE_OTHER): Payer: 59 | Admitting: Family Medicine

## 2017-11-02 ENCOUNTER — Encounter: Payer: Self-pay | Admitting: Family Medicine

## 2017-11-02 VITALS — BP 133/75 | HR 70 | Temp 98.1°F | Resp 16 | Ht 72.0 in | Wt 205.1 lb

## 2017-11-02 DIAGNOSIS — M791 Myalgia, unspecified site: Secondary | ICD-10-CM

## 2017-11-02 DIAGNOSIS — R7301 Impaired fasting glucose: Secondary | ICD-10-CM | POA: Diagnosis not present

## 2017-11-02 DIAGNOSIS — E7800 Pure hypercholesterolemia, unspecified: Secondary | ICD-10-CM

## 2017-11-02 DIAGNOSIS — E663 Overweight: Secondary | ICD-10-CM | POA: Diagnosis not present

## 2017-11-02 NOTE — Progress Notes (Signed)
OFFICE VISIT  11/02/2017   CC:  Chief Complaint  Rodney Watkins presents with  . Follow-up    RCI, pt is fasting.      HPI:    Rodney Watkins is a 62 y.o. Caucasian male who presents for 6 mo f/u hypercholesterolemia and IFG. Pt started atorvastatin 09/08/2017. Recent Hx IFG--will repeat fasting gluc and A1c today.  Takes atorva qd.  Urine is darker yellow but not red or brown.  No new pains since starting atorva. Very busy, diet not ideal lately. Exercise: he has increased it some.  Notes bilat hands and bilat feet aching.  Some chronic paresthesias in feet. (Hx of lumbar pars defect, ESI x 2 no help.  Dry needling no help.  Chiropracter no help. He got dx'd with prostate ca after this, then got prostatectomy and had some pelvic nerve damage affecting L leg. Balance is off due to diminished position sense in feet).  Additionally, he notes a feeling of generalized achiness--this has been occurring for quite a bit of time prior to getting on statin (and mild diffuse weakness) in shoulders, upper arms, traps, SCM muscle regions--symmetric.  No fevers, no abnl wt loss.  No joint swelling or redness.   Some lesser/milder muscle aching in thighs. No rash.  No HAs in temples, no vision complaints.    Past Medical History:  Diagnosis Date  . Diastasis of rectus abdominis   . Erectile dysfunction   . History of kidney stones    bladder  . Hyperlipidemia    Mild.  No meds in past records.  Recommended statin 05/2017 but pt wanted to try TLC first.  Lipids unchanged 09/2017 so on 09/09/17 he started atorva 59m.  . IFG (impaired fasting glucose)    noted in old records; needs HbA1c at next blood draw (as of 04/19/15).  A1c 5.7% 05/2017.  . Incisional hernia without obstruction or gangrene 05/2017  . Lumbar spondylosis    w/pars defect and listhesis (Dr. CSaintclair Halsted.  Referred by neurosurg to pain mgmt MD and pt got ESI x 2 ---no help.  . Paresthesias    UEs and LE's 2019---recommended neuro referral and pt  wanted to think about it.  I ended up referring him 08/24/17  . Prostate cancer ([place]    Referred for increased PSA velocity.  Bx 06/17/16 showed gleeson 3+3=6 adenocarcinoma of prostate with 9 of 12 bx cores + for malignancy.  Prostatectomy 07/2016.  . Pulmonary nodule 2009   Stable CT chest 2010--no further scans needed in pt at low risk for lung ca.    Past Surgical History:  Procedure Laterality Date  . COLONOSCOPY  04/20/11   Normal: recall 10 yrs  . ett  approx 2010   normal  . KNEE ARTHROSCOPY  2000   left  . LYMPHADENECTOMY Bilateral 08/06/2016   Procedure: PELVIC LYMPHADENECTOMY;  Surgeon: LRaynelle Bring MD;  Location: [facility];  Service: Urology;  Laterality: Bilateral;  . REFRACTIVE SURGERY  2000   both eyes  . ROBOT ASSISTED LAPAROSCOPIC RADICAL PROSTATECTOMY N/A 08/06/2016   Procedure: Path: adenocarcinoma with extraprostatic extension present, negative margins, lymph nodes NEG. XI ROBOTIC ASSISTED LAPAROSCOPIC RADICAL PROSTATECTOMY LEVEL 2;  Surgeon: LRaynelle Bring MD;  Location: [facility];  Service: Urology;  Laterality: N/A;  . ROTATOR CUFF REPAIR  2002   right  . ROTATOR CUFF REPAIR  2003   left    Outpatient Medications Prior to Visit  Medication Sig Dispense Refill  . atorvastatin (LIPITOR) 20 MG tablet Take 1 tablet (  20 mg total) by mouth daily. 30 tablet 2  . calcium carbonate (TUMS - DOSED IN MG ELEMENTAL CALCIUM) 500 MG chewable tablet Chew 1-2 tablets by mouth daily as needed for indigestion or heartburn.    . cetirizine (ZYRTEC) 10 MG tablet Take 10 mg by mouth daily as needed for allergies.    . IBUPROFEN PO Take by mouth. As needed    . Multiple Vitamin (MULTIVITAMIN) tablet Take 1 tablet by mouth daily.    . Omega-3 Fatty Acids (FISH OIL PO) Take 1 capsule by mouth daily.    Marland Kitchen acetaminophen (TYLENOL) 325 MG tablet Take 650 mg by mouth daily as needed for moderate pain or headache.    Marland Kitchen HYDROcodone-acetaminophen (NORCO) 5-325 MG tablet Take 1-2 tablets by mouth  every 6 (six) hours as needed for moderate pain or severe pain. (Rodney Watkins not taking: Reported on 05/25/2017) 30 tablet 0  . sulfamethoxazole-trimethoprim (BACTRIM DS,SEPTRA DS) 800-160 MG tablet Take 1 tablet by mouth 2 (two) times daily. Start the day prior to foley removal appointment (Rodney Watkins not taking: Reported on 05/25/2017) 6 tablet 0   No facility-administered medications prior to visit.     No Known Allergies  ROS As per HPI  PE: Blood pressure 133/75, pulse 70, temperature 98.1 F (36.7 C), temperature source Oral, resp. rate 16, height 6' (1.829 m), weight 205 lb 2 oz (93 kg), SpO2 96 %. Gen: Alert, well appearing.  Rodney Watkins is oriented to person, place, time, and situation. AFFECT: pleasant, lucid thought and speech. CV: RRR, no m/r/g.   LUNGS: CTA bilat, nonlabored resps, good aeration in all lung fields. Musc: no joint swelling, redness, or tenderness.  All joints ROM intact. No rash.  He has mild symmetric TTP in SCM mm's of neck, upper back/scap regions, traps, shoulders/deltoids. Minimal discomfort to palpation of upper arms or forearms.  No signif TTP of thighs or calves.     LABS:  Lab Results  Component Value Date   TSH 2.13 01/06/2017   Lab Results  Component Value Date   WBC 5.2 01/06/2017   HGB 15.4 01/06/2017   HCT 46 01/06/2017   MCV 89.1 08/04/2016   PLT 275 01/06/2017   Lab Results  Component Value Date   CREATININE 0.97 05/25/2017   BUN 15 05/25/2017   NA 139 05/25/2017   K 4.5 05/25/2017   CL 105 05/25/2017   CO2 25 05/25/2017   Lab Results  Component Value Date   ALT 33 01/06/2017   AST 45 (A) 01/06/2017   ALKPHOS 60 01/06/2017   Lab Results  Component Value Date   CHOL 199 09/07/2017   Lab Results  Component Value Date   HDL 46.30 09/07/2017   Lab Results  Component Value Date   LDLCALC 142 (H) 09/07/2017   Lab Results  Component Value Date   TRIG 53.0 09/07/2017   Lab Results  Component Value Date   CHOLHDL 4  09/07/2017   Lab Results  Component Value Date   HGBA1C 5.7 05/25/2017   No results found for: ANA, RF  IMPRESSION AND PLAN:  1) Hyperlipidemia: on statin for 7 wks or so now---tolerating this.  Repeat FLP today. He is not doing any exercise or signif dieting.  2) IFG: He is not doing any exercise or signif dieting. Recheck fasting gluc and Hba1c today.  3) Myalgias, chronic, mild--primarily upper body.  Subjective generalized feeling of weakness symmetrically in shoulders, neck, upper back.  ? Polymyalgia rheumatica. Check ESR, CRP, CPK.  4) Neuropathic pain/paresthesias: unclear etiology, but likely partially related to hx of LBP with spinal nerve radiculopathy.   Declines trial of neuropathic pain meds-he is afraid of the potential sedation they may cause ON TOP of his chronic fatigue.  FOLLOW UP: Return in about 6 months (around 05/04/2018) for routine chronic illness f/u (30 min).  Signed:  Crissie Sickles, MD           11/02/2017

## 2017-11-03 ENCOUNTER — Encounter: Payer: Self-pay | Admitting: Family Medicine

## 2017-11-03 LAB — BASIC METABOLIC PANEL
BUN: 13 mg/dL (ref 7–25)
CHLORIDE: 107 mmol/L (ref 98–110)
CO2: 24 mmol/L (ref 20–32)
Calcium: 9.6 mg/dL (ref 8.6–10.3)
Creat: 0.99 mg/dL (ref 0.70–1.25)
Glucose, Bld: 104 mg/dL — ABNORMAL HIGH (ref 65–99)
Potassium: 4.4 mmol/L (ref 3.5–5.3)
Sodium: 139 mmol/L (ref 135–146)

## 2017-11-03 LAB — C-REACTIVE PROTEIN: CRP: 0.8 mg/L (ref <8.0)

## 2017-11-03 LAB — HEMOGLOBIN A1C
Hgb A1c MFr Bld: 5.3 % of total Hgb (ref <5.7)
Mean Plasma Glucose: 105 (calc)
eAG (mmol/L): 5.8 (calc)

## 2017-11-03 LAB — LIPID PANEL
Cholesterol: 126 mg/dL (ref <200)
HDL: 55 mg/dL (ref >40)
LDL CHOLESTEROL (CALC): 59 mg/dL
NON-HDL CHOLESTEROL (CALC): 71 mg/dL (ref <130)
TRIGLYCERIDES: 43 mg/dL (ref <150)
Total CHOL/HDL Ratio: 2.3 (calc) (ref <5.0)

## 2017-11-03 LAB — SEDIMENTATION RATE: SED RATE: 2 mm/h (ref 0–20)

## 2017-11-03 LAB — CK: Total CK: 61 U/L (ref 44–196)

## 2017-12-10 ENCOUNTER — Other Ambulatory Visit: Payer: Self-pay | Admitting: Family Medicine

## 2017-12-27 ENCOUNTER — Encounter: Payer: Self-pay | Admitting: Family Medicine

## 2018-02-10 ENCOUNTER — Other Ambulatory Visit: Payer: Self-pay | Admitting: *Deleted

## 2018-02-10 MED ORDER — ATORVASTATIN CALCIUM 20 MG PO TABS: 20.0000 mg | ORAL_TABLET | Freq: Every day | ORAL | 0 refills | Status: DC

## 2018-03-22 ENCOUNTER — Ambulatory Visit: Payer: Self-pay | Admitting: Psychiatry

## 2018-04-05 IMAGING — DX DG CHEST 2V
2 series · 2 of 2 positions shown · non-contrast
Comparison: Chest CT 04/16/2009 and chest radiograph 11/25/2007.

CLINICAL DATA: Pre operative examination.  Prostate cancer.

EXAM:
CHEST  2 VIEW

[chest pa]
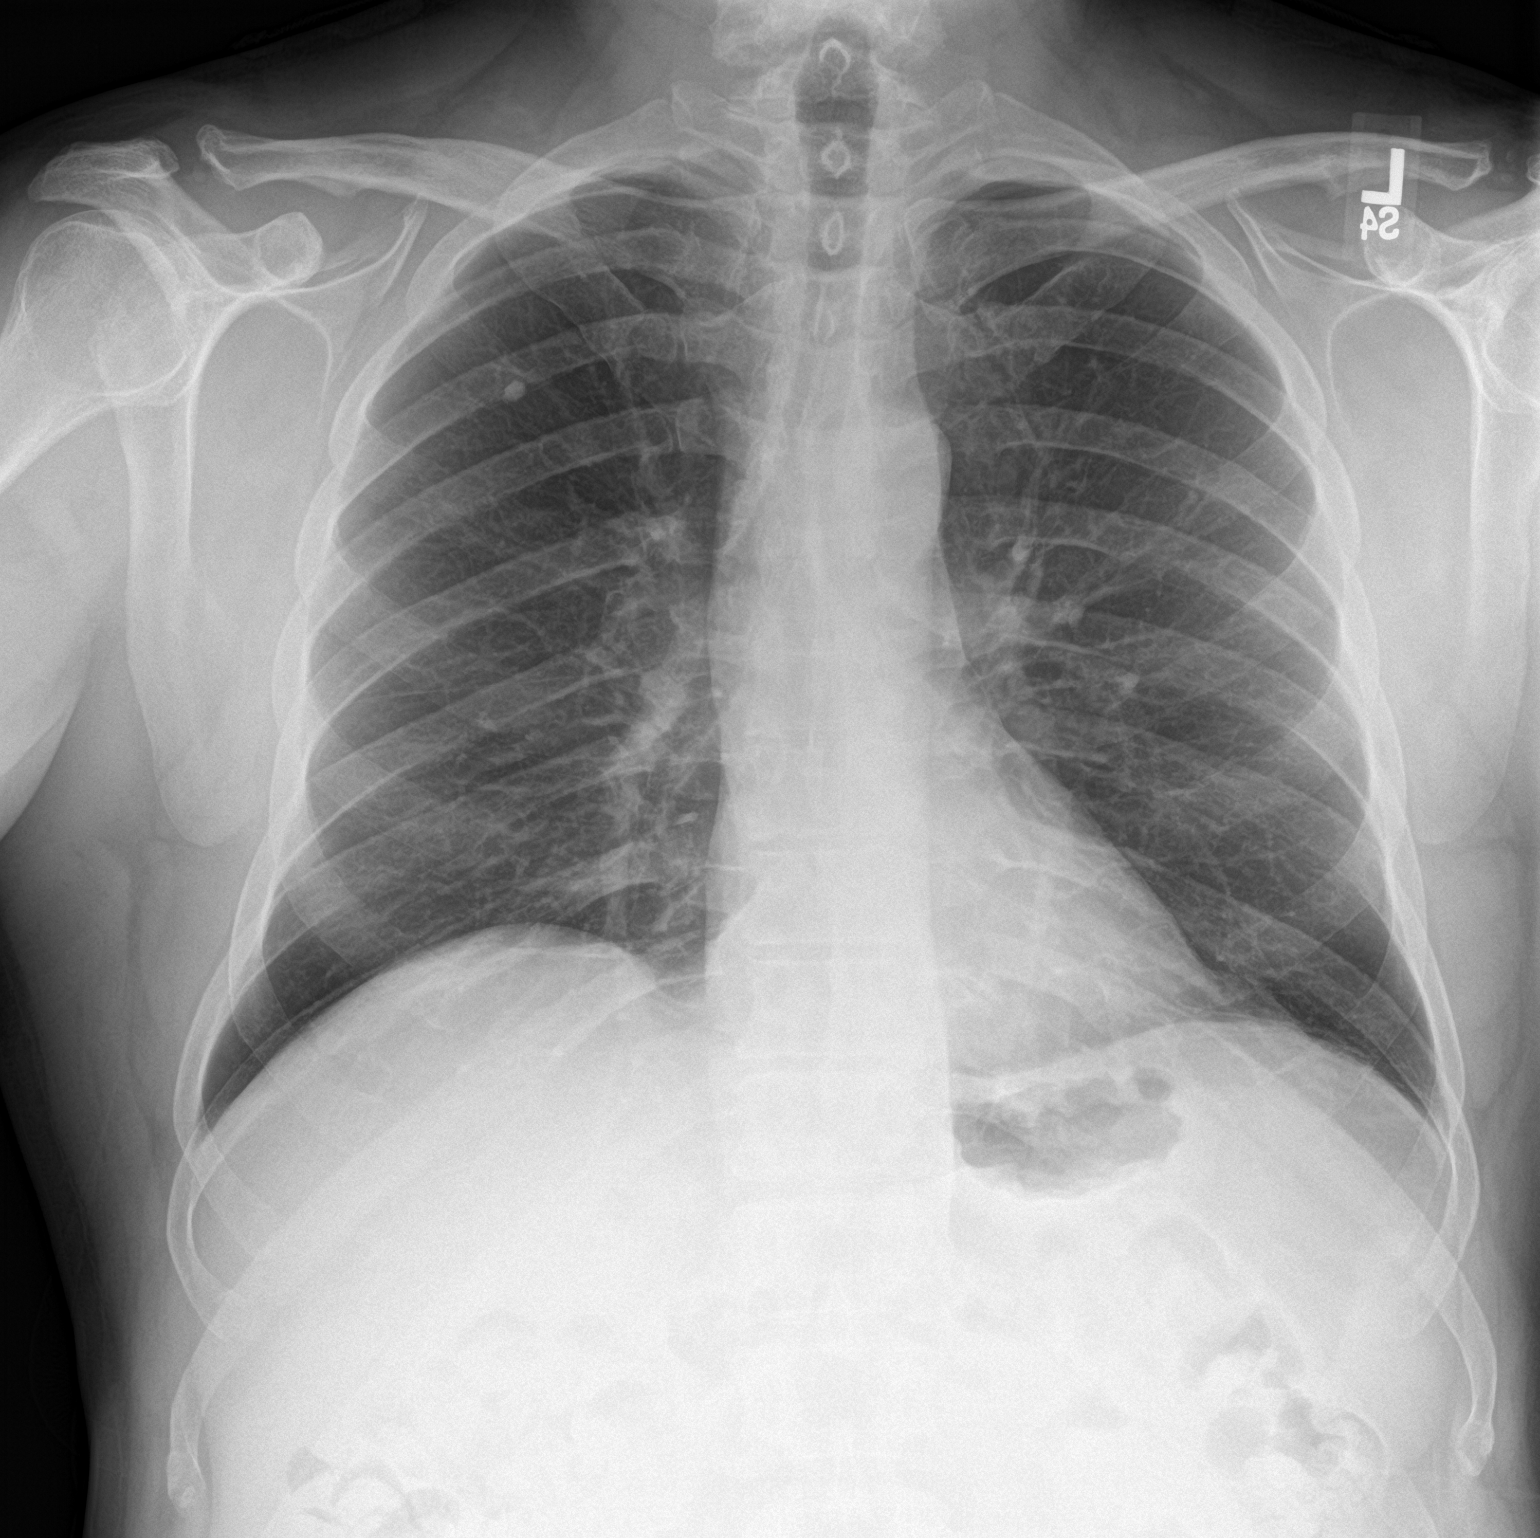

[chest lat]
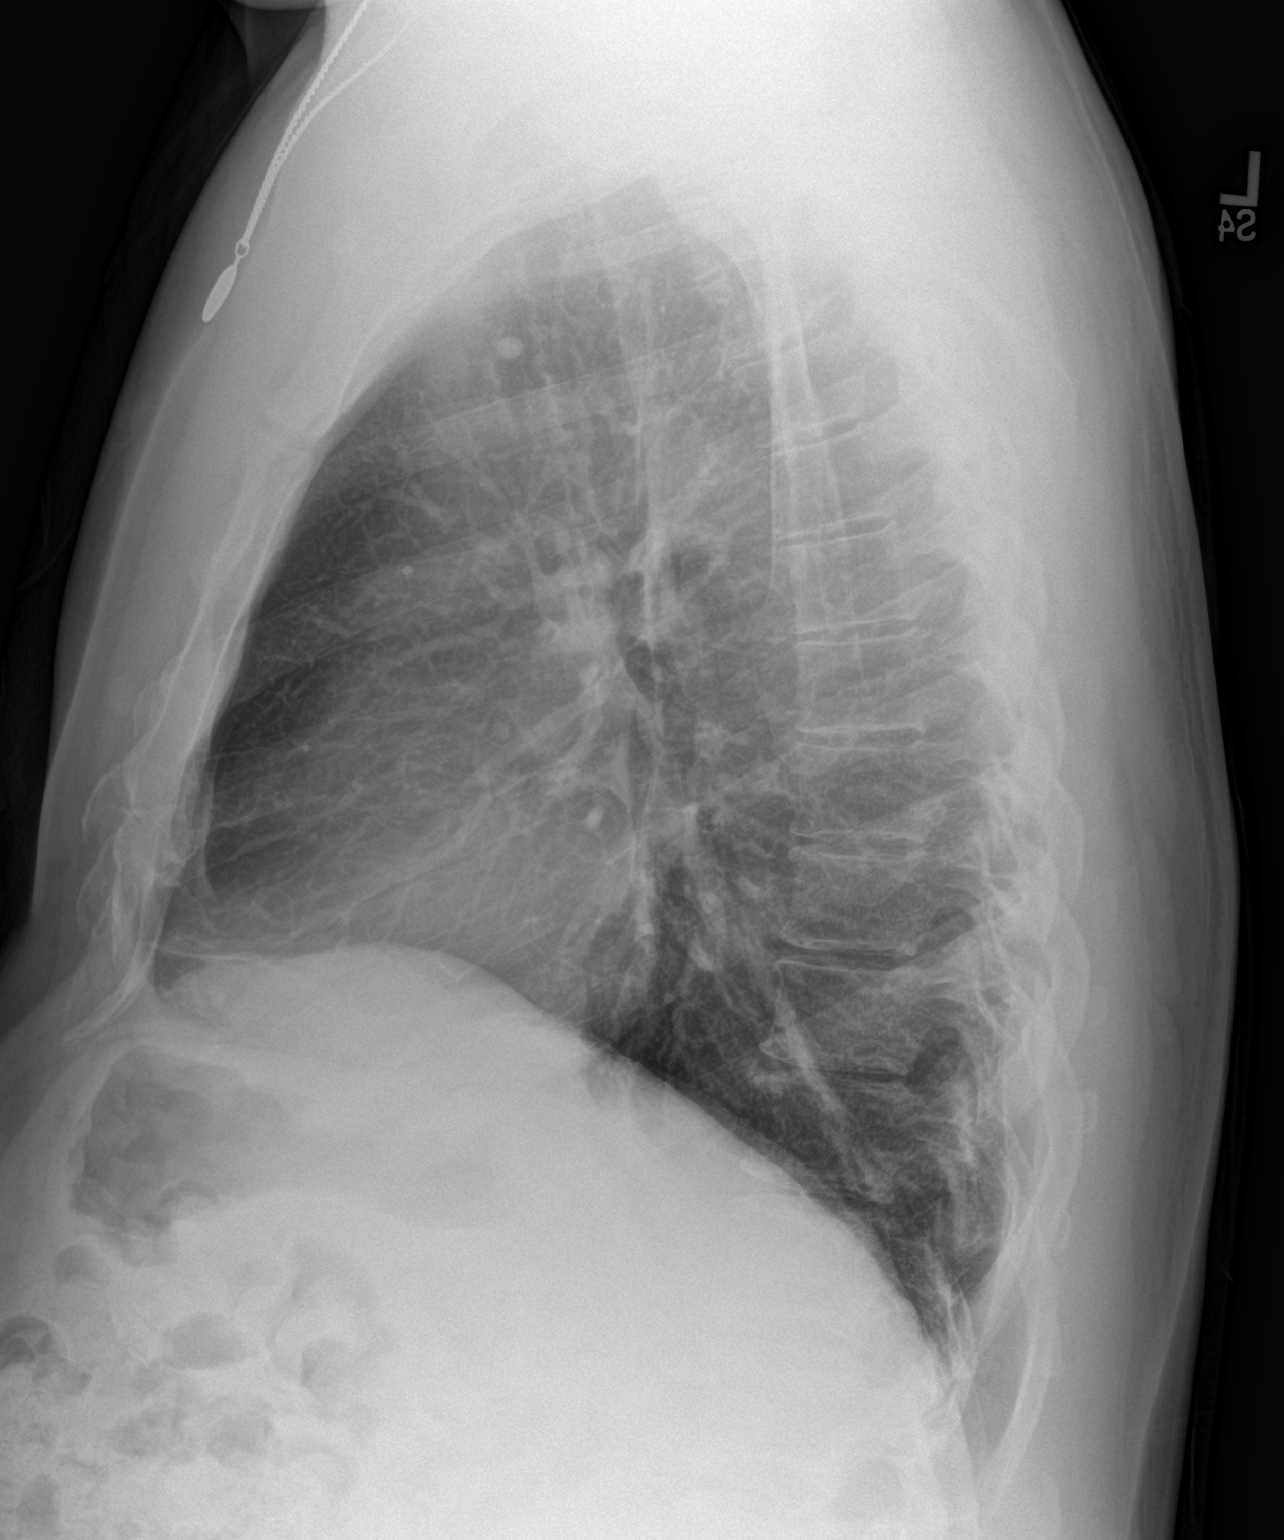

[2 of 2 positions shown; findings below may reference images not displayed]

FINDINGS: Stable 7 mm calcified granuloma in the right upper lung. No evidence
for airspace disease or pulmonary edema. Heart and mediastinum are
within normal limits. The trachea is midline. No acute bone
abnormality. No pleural effusions.
IMPRESSION: No active cardiopulmonary disease.

## 2018-04-18 ENCOUNTER — Other Ambulatory Visit: Payer: Self-pay | Admitting: Family Medicine

## 2018-05-13 ENCOUNTER — Ambulatory Visit (INDEPENDENT_AMBULATORY_CARE_PROVIDER_SITE_OTHER): Payer: 59 | Admitting: Family Medicine

## 2018-05-13 ENCOUNTER — Encounter: Payer: Self-pay | Admitting: Family Medicine

## 2018-05-13 VITALS — BP 128/79 | HR 71 | Temp 98.2°F | Resp 16 | Ht 72.0 in | Wt 209.0 lb

## 2018-05-13 DIAGNOSIS — R7301 Impaired fasting glucose: Secondary | ICD-10-CM | POA: Diagnosis not present

## 2018-05-13 DIAGNOSIS — E7800 Pure hypercholesterolemia, unspecified: Secondary | ICD-10-CM | POA: Diagnosis not present

## 2018-05-13 NOTE — Progress Notes (Signed)
OFFICE VISIT  05/13/2018   CC:  Chief Complaint  Patient presents with  . Follow-up    RCI, pt is fasting.   HPI:    Patient is a 63 y.o. Caucasian male who presents for 6 mo f/u HLD, IFG. His cholesterol last visit after being on atorva a couple months was EXCELLENT. His A1c at that time was excellent.  Interim hx: Has hx of LBP and LE paresthesias, has had post-prostatectomy pelvic "nerve PT". Says feet and arms still with a burning/tingling feeling that really bothers him, says he feels like his legs are heavy. He's on waiting list to see Dr. Posey Pronto with [facility] neurology.  Taking statin daily still. Still with waxing and waning myalgias and arthralgias, but it is certainly not clear that these sx's are any worse on his statin.    IFG: he is eating pretty healthy.  Not much exercise due to his neuromuscular sx's. His goal is to lose 10-15 lbs, admits he has some work to do on lowering carbs. Trying to drink more water, taking vitamins.   Past Medical History:  Diagnosis Date  . Diastasis of rectus abdominis   . Erectile dysfunction    sildenafil prn helpful  . History of kidney stones    bladder  . Hyperlipidemia    Mild.  No meds in past records.  Recommended statin 05/2017 but pt wanted to try TLC first.  Lipids unchanged 09/2017 so on 09/09/17 he started atorva 20mg .  . IFG (impaired fasting glucose)    Per old records.  A1c 5.7% 05/2017.  A1c 5.3% 10/2017.  . Incisional hernia without obstruction or gangrene 05/2017  . Lumbar spondylosis    w/pars defect and listhesis (Dr. Saintclair Halsted).  Referred by neurosurg to pain mgmt MD and pt got ESI x 2 ---no help.  . Paresthesias    UEs and LE's 2019---recommended neuro referral and pt wanted to think about it.  I ended up referring him 08/24/17  . Prostate cancer [facility])    Locally advanced prostate ca 06/2016.  Prostatectomy  RAL radical prostatectomy 07/2016.  PSA undectable as of 12/2017 urol f/u.  Marland Kitchen Pulmonary nodule 2009   Stable CT  chest 2010--no further scans needed in pt at low risk for lung ca.    Past Surgical History:  Procedure Laterality Date  . COLONOSCOPY  04/20/11   Normal: recall 10 yrs  . ett  approx 2010   normal  . KNEE ARTHROSCOPY  2000   left  . LYMPHADENECTOMY Bilateral 08/06/2016   Procedure: PELVIC LYMPHADENECTOMY;  Surgeon: Raynelle Bring, MD;  Location: [facility];  Service: Urology;  Laterality: Bilateral;  . REFRACTIVE SURGERY  2000   both eyes  . ROBOT ASSISTED LAPAROSCOPIC RADICAL PROSTATECTOMY N/A 08/06/2016   Procedure: Path: adenocarcinoma with extraprostatic extension present, negative margins, lymph nodes NEG. XI ROBOTIC ASSISTED LAPAROSCOPIC RADICAL PROSTATECTOMY LEVEL 2;  Surgeon: Raynelle Bring, MD;  Location: [facility];  Service: Urology;  Laterality: N/A;  . ROTATOR CUFF REPAIR  2002   right  . ROTATOR CUFF REPAIR  2003   left    Outpatient Medications Prior to Visit  Medication Sig Dispense Refill  . atorvastatin (LIPITOR) 20 MG tablet TAKE 1 TABLET DAILY 30 tablet 0  . calcium carbonate (TUMS - DOSED IN MG ELEMENTAL CALCIUM) 500 MG chewable tablet Chew 1-2 tablets by mouth daily as needed for indigestion or heartburn.    . cetirizine (ZYRTEC) 10 MG tablet Take 10 mg by mouth daily as needed for  allergies.    . IBUPROFEN PO Take by mouth. As needed    . Multiple Vitamin (MULTIVITAMIN) tablet Take 1 tablet by mouth daily.    . Omega-3 Fatty Acids (FISH OIL PO) Take 1 capsule by mouth daily.     No facility-administered medications prior to visit.     No Known Allergies  ROS As per HPI  PE: Blood pressure 128/79, pulse 71, temperature 98.2 F (36.8 C), temperature source Oral, resp. rate 16, height 6' (1.829 m), weight 209 lb (94.8 kg), SpO2 95 %. Gen: Alert, well appearing.  Patient is oriented to person, place, time, and situation. AFFECT: pleasant, lucid thought and speech. No further exam today.  LABS:    Chemistry      Component Value Date/Time   NA 139 11/02/2017  1011   NA 140 01/06/2017 1535   K 4.4 11/02/2017 1011   CL 107 11/02/2017 1011   CO2 24 11/02/2017 1011   BUN 13 11/02/2017 1011   BUN 13 01/06/2017 1535   CREATININE 0.99 11/02/2017 1011   GLU 120 01/06/2017 1535      Component Value Date/Time   CALCIUM 9.6 11/02/2017 1011   ALKPHOS 60 01/06/2017 1535   AST 45 (A) 01/06/2017 1535   ALT 33 01/06/2017 1535     Lab Results  Component Value Date   CHOL 126 11/02/2017   HDL 55 11/02/2017   LDLCALC 59 11/02/2017   TRIG 43 11/02/2017   CHOLHDL 2.3 11/02/2017   Lab Results  Component Value Date   TSH 2.13 01/06/2017   Lab Results  Component Value Date   WBC 5.2 01/06/2017   HGB 15.4 01/06/2017   HCT 46 01/06/2017   MCV 89.1 08/04/2016   PLT 275 01/06/2017   Lab Results  Component Value Date   HGBA1C 5.3 11/02/2017   Lab Results  Component Value Date   ESRSEDRATE 2 11/02/2017   Lab Results  Component Value Date   CRP 0.8 11/02/2017   Lab Results  Component Value Date   CKTOTAL 61 11/02/2017    IMPRESSION AND PLAN:  1) Hyperlipidemia: doing well on atorva 20mg  qd.  2) IFG: his A1c has remained good. He has some work to do to improve diet.  Exercise is limited by his paresthesias/LB symptoms. Has plans to see neurologist about his paresthesias and LBP. No labs today.  An After Visit Summary was printed and given to the patient.  FOLLOW UP: Return in about 6 months (around 11/11/2018) for annual CPE (fasting).  Signed:  Crissie Sickles, MD           05/13/2018

## 2018-05-20 ENCOUNTER — Encounter: Payer: Self-pay | Admitting: Neurology

## 2018-05-20 ENCOUNTER — Ambulatory Visit (INDEPENDENT_AMBULATORY_CARE_PROVIDER_SITE_OTHER): Payer: 59 | Admitting: Neurology

## 2018-05-20 VITALS — BP 142/100 | HR 89 | Ht 72.0 in | Wt 209.0 lb

## 2018-05-20 DIAGNOSIS — G8929 Other chronic pain: Secondary | ICD-10-CM | POA: Diagnosis not present

## 2018-05-20 DIAGNOSIS — R202 Paresthesia of skin: Secondary | ICD-10-CM

## 2018-05-20 DIAGNOSIS — M79651 Pain in right thigh: Secondary | ICD-10-CM | POA: Diagnosis not present

## 2018-05-20 DIAGNOSIS — M545 Low back pain: Secondary | ICD-10-CM | POA: Diagnosis not present

## 2018-05-20 NOTE — Progress Notes (Signed)
[facility] Clinic Note - Initial Visit   Date: 05/20/18  DAL BLEW MRN: [id number] DOB: 14-Aug-1955   Dear Dr. Anitra Lauth:  Thank you for your kind referral of Rodney Watkins for consultation of Generalized paresthesias and right leg pain. Although his history is well known to you, please allow [place] to reiterate it for the purpose of our medical record. The patient was accompanied to the clinic by self.    History of Present Illness: Rodney Watkins is a 63 y.o. right-handed Caucasian male with hyperlipidemia, lumbar spondylosis, prostate cancer s/p robotic prostatectomy presenting for evaluation of generalized paresthesias and right leg pain.  He is a retired Air[employer] pilot.  In 01/2015, he was on an elliptical machine and began having hip pain.  He was evaluated by orthopaedic surgery and was found to have bilateral L5 pars defect which was radiating to his hip. He was referred to Dr. Saintclair Halsted who did not find any role for surgery and referred to Dr. Brien Few for pain management.    In January 2018, he was diagnosed with prostate cancer and prostatectomy.  Following his surgery, he could not raise his right leg which was thought to be secondary to obturator nerve injury.  He was also having pain from his going into right medial thigh.  He has tried epidural steroid injections, dry needling, and chiropractic adjustments with no benefit.  He was having some initial improvement with physical therapy with respect to right leg weakness, however he exercises aggravated his low back pain so this was stopped.  He also complains of generalized tingling of the feet and bilateral arms.  He is concerned that he cannot run and feels that there is lack of coordination.  He has no problems with walking or jogging.    He complaints of bilateral leg pain and tingling of the feet.  He is able to walk and job, but cannot run because he feels that his coordination is not there.   He complains of sharp pain from the groin into his medial nerve.    He has not history diabetes.  He drinks beer over the weekend on the weekends.     Out-side paper records, electronic medical record, and images have been reviewed where available and summarized as:  MRI lumbra spine wwo contrast 07/09/2016: No change since 2016. 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 Degeneration and bulging of the disc. Mild foraminal narrowing bilaterally without visible compression of the exiting L5 nerve roots.  Non-compressive disc bulges L3-4 and L4-5.  Lab Results  Component Value Date   TSH 2.13 01/06/2017   Lab Results  Component Value Date   VITAMINB12 410 05/25/2017     .  Past Medical History:  Diagnosis Date  . Diastasis of rectus abdominis   . Erectile dysfunction    sildenafil prn helpful  . History of kidney stones    bladder  . Hyperlipidemia    Mild.  No meds in past records.  Recommended statin 05/2017 but pt wanted to try TLC first.  Lipids unchanged 09/2017 so on 09/09/17 he started atorva 20mg .  . IFG (impaired fasting glucose)    Per old records.  A1c 5.7% 05/2017.  A1c 5.3% 10/2017.  . Incisional hernia without obstruction or gangrene 05/2017  . Lumbar spondylosis    w/pars defect and listhesis (Dr. Saintclair Halsted).  Referred by neurosurg to pain mgmt MD and pt got ESI x 2 ---no help.  Rodney Watkins  Paresthesias    UEs and LE's 2019---recommended neuro referral and pt wanted to think about it.  I ended up referring him 08/24/17  . Prostate cancer [facility])    Locally advanced prostate ca 06/2016.  Prostatectomy  RAL radical prostatectomy 07/2016.  PSA undectable as of 12/2017 urol f/u.  Rodney Watkins Pulmonary nodule 2009   Stable CT chest 2010--no further scans needed in pt at low risk for lung ca.    Past Surgical History:  Procedure Laterality Date  . COLONOSCOPY  04/20/11   Normal: recall 10 yrs  . ett  approx 2010   normal  . KNEE ARTHROSCOPY  2000   left  .  LYMPHADENECTOMY Bilateral 08/06/2016   Procedure: PELVIC LYMPHADENECTOMY;  Surgeon: Raynelle Bring, MD;  Location: [facility];  Service: Urology;  Laterality: Bilateral;  . REFRACTIVE SURGERY  2000   both eyes  . ROBOT ASSISTED LAPAROSCOPIC RADICAL PROSTATECTOMY N/A 08/06/2016   Procedure: Path: adenocarcinoma with extraprostatic extension present, negative margins, lymph nodes NEG. XI ROBOTIC ASSISTED LAPAROSCOPIC RADICAL PROSTATECTOMY LEVEL 2;  Surgeon: Raynelle Bring, MD;  Location: [facility];  Service: Urology;  Laterality: N/A;  . ROTATOR CUFF REPAIR  2002   right  . ROTATOR CUFF REPAIR  2003   left     Medications:  Outpatient Encounter Medications as of 05/20/2018  Medication Sig  . atorvastatin (LIPITOR) 20 MG tablet TAKE 1 TABLET DAILY  . calcium carbonate (TUMS - DOSED IN MG ELEMENTAL CALCIUM) 500 MG chewable tablet Chew 1-2 tablets by mouth daily as needed for indigestion or heartburn.  . cetirizine (ZYRTEC) 10 MG tablet Take 10 mg by mouth daily as needed for allergies.  . IBUPROFEN PO Take by mouth. As needed  . Multiple Vitamin (MULTIVITAMIN) tablet Take 1 tablet by mouth daily.  . Omega-3 Fatty Acids (FISH OIL PO) Take 1 capsule by mouth daily.   No facility-administered encounter medications on file as of 05/20/2018.      Allergies: No Known Allergies  Family History: Family History  Problem Relation Age of Onset  . Diabetes Father   . Cancer Sister        unknown  . Colon cancer Neg Hx   . Stomach cancer Neg Hx     Social History: Social History   Tobacco Use  . Smoking status: Former Smoker    Last attempt to quit: 04/08/1982    Years since quitting: 36.1  . Smokeless tobacco: Never Used  Substance Use Topics  . Alcohol use: Yes    Alcohol/week: 3.0 standard drinks    Types: 3 Cans of beer per week  . Drug use: No   Social History   Social History Narrative   Married, 3 daughters.   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   No T/A/Ds.         Pt is right-handed. He lives with his wife and three daughters in a two level home. Prior to prostate cancer surgery in 2018, he exercised regularly. He walks now, weather permitting.    Review of Systems:  CONSTITUTIONAL: No fevers, chills, night sweats, or weight loss.   EYES: No visual changes or eye pain ENT: No hearing changes.  No history of nose bleeds.   RESPIRATORY: No cough, wheezing and shortness of breath.   CARDIOVASCULAR: Negative for chest pain, and palpitations.   GI: Negative for abdominal discomfort, blood in stools or black stools.  No  recent change in bowel habits.   GU:  No history of incontinence.   MUSCLOSKELETAL: +history of joint pain or swelling.  No myalgias.   SKIN: Negative for lesions, rash, and itching.   HEMATOLOGY/ONCOLOGY: Negative for prolonged bleeding, bruising easily, and swollen nodes.  +history of cancer.   ENDOCRINE: Negative for cold or heat intolerance, polydipsia or goiter.   PSYCH:  o depression +anxiety symptoms.   NEURO: As Above.   Vital Signs:  BP (!) 142/100   Pulse 89   Ht 6' (1.829 m)   Wt 209 lb (94.8 kg)   SpO2 98%   BMI 28.35 kg/m    General Medical Exam:   General:  Well appearing, comfortable.   Eyes/ENT: see cranial nerve examination.   Neck: No masses appreciated.  Full range of motion without tenderness.  No carotid bruits. Respiratory:  Clear to auscultation, good air entry bilaterally.   Cardiac:  Regular rate and rhythm, no murmur.   Extremities:  No deformities, edema, or skin discoloration.  Skin:  No rashes or lesions.  Neurological Exam: MENTAL STATUS including orientation to time, place, person, recent and remote memory, attention span and concentration, language, and fund of knowledge is normal.  Speech is not dysarthric.  CRANIAL NERVES: II:  No visual field defects.  Unremarkable fundi.   Watkins-IV-VI: Pupils equal round and reactive to light.   Normal conjugate, extra-ocular eye movements in all directions of gaze.  No nystagmus.  No ptosis.   V:  Normal facial sensation.     VII:  Normal facial symmetry and movements.  VIII:  Normal hearing and vestibular function.   IX-X:  Normal palatal movement.   XI:  Normal shoulder shrug and head rotation.   XII:  Normal tongue strength and range of motion, no deviation or fasciculation.  MOTOR:  No atrophy, fasciculations or abnormal movements.  No pronator drift.  Tone is normal.    Right Upper Extremity:    Left Upper Extremity:    Deltoid  5/5   Deltoid  5/5   Biceps  5/5   Biceps  5/5   Triceps  5/5   Triceps  5/5   Wrist extensors  5/5   Wrist extensors  5/5   Wrist flexors  5/5   Wrist flexors  5/5   Finger extensors  5/5   Finger extensors  5/5   Finger flexors  5/5   Finger flexors  5/5   Dorsal interossei  5/5   Dorsal interossei  5/5   Abductor pollicis  5/5   Abductor pollicis  5/5   Tone (Ashworth scale)  0  Tone (Ashworth scale)  0   Right Lower Extremity:    Left Lower Extremity:    Hip flexors  5/5   Hip flexors  5/5   Hip extensors  5/5   Hip extensors  5/5   Abductor 5/5  Abductor 5/5  Adductor 5/5  Adductor 5/5  Knee flexors  5/5   Knee flexors  5/5   Knee extensors  5/5   Knee extensors  5/5   Dorsiflexors  5/5   Dorsiflexors  5/5   Plantarflexors  5/5   Plantarflexors  5/5   Toe extensors  5/5   Toe extensors  5/5   Toe flexors  5/5   Toe flexors  5/5   Tone (Ashworth scale)  0  Tone (Ashworth scale)  0   MSRs:  Right  Left brachioradialis 2+  brachioradialis 2+  biceps 2+  biceps 2+  triceps 2+  triceps 2+  patellar 2+  patellar 2+  ankle jerk 2+  ankle jerk 2+  Hoffman no  Hoffman no  plantar response down  plantar response down   SENSORY:  Normal and symmetric perception of light touch, pinprick, vibration, and proprioception.    COORDINATION/GAIT: Normal finger-to- nose-finger and  heel-to-shin.  Intact rapid alternating movements bilaterally.   Gait narrow based and stable.  He has mild unsteadiness with tandem gait, however he is able to perform this.  Stress gait is intact.     IMPRESSION: 1.  Generalized paresthesias of the hands and feet.  His upper extremity symptoms could certainly be stemming from entrapment neuropathy from either median or ulnar nerve.  With his feet paresthesias, this is more uncertain as there are no signs of neuropathy on his exam or radiculopathy on his imaging.  He will undergo electrodiagnostic testing of bilateral arms and legs. 2.  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  It was explained that nerve recovery is very slow and depending on the severity, there may be some lasting deficits.  3.  Chronic low back pain may be more lumbar strain.  Muscle relaxant and gabapentin was declined.  He was encouraged to try stretching regimen for his low back.  Further recommendations pending EMG results.    Thank you for allowing me to participate in patient's care.  If I can answer any additional questions, I would be pleased to do so.    Sincerely,    Victorine Mcnee K. Posey Pronto, DO

## 2018-05-20 NOTE — Patient Instructions (Signed)
NCS/EMG of the legs and arms  ELECTROMYOGRAM AND NERVE CONDUCTION STUDIES (EMG/NCS) INSTRUCTIONS  How to Prepare The neurologist conducting the EMG will need to know if you have certain medical conditions. Tell the neurologist and other EMG lab personnel if you: . Have a pacemaker or any other electrical medical device . Take blood-thinning medications . Have hemophilia, a blood-clotting disorder that causes prolonged bleeding Bathing Take a shower or bath shortly before your exam in order to remove oils from your skin. Don't apply lotions or creams before the exam.  What to Expect You'll likely be asked to change into a hospital gown for the procedure and lie down on an examination table. The following explanations can help you understand what will happen during the exam.  . Electrodes. The neurologist or a technician places surface electrodes at various locations on your skin depending on where you're experiencing symptoms. Or the neurologist may insert needle electrodes at different sites depending on your symptoms.  . Sensations. The electrodes will at times transmit a tiny electrical current that you may feel as a twinge or spasm. The needle electrode may cause discomfort or pain that usually ends shortly after the needle is removed. If you are concerned about discomfort or pain, you may want to talk to the neurologist about taking a short break during the exam.  . Instructions. During the needle EMG, the neurologist will assess whether there is any spontaneous electrical activity when the muscle is at rest - activity that isn't present in healthy muscle tissue - and the degree of activity when you slightly contract the muscle.  He or she will give you instructions on resting and contracting a muscle at appropriate times. Depending on what muscles and nerves the neurologist is examining, he or she may ask you to change positions during the exam.  After your EMG You may experience some  temporary, minor bruising where the needle electrode was inserted into your muscle. This bruising should fade within several days. If it persists, contact your primary care doctor.

## 2018-05-31 ENCOUNTER — Encounter: Payer: Self-pay | Admitting: Family Medicine

## 2018-06-02 ENCOUNTER — Ambulatory Visit (INDEPENDENT_AMBULATORY_CARE_PROVIDER_SITE_OTHER): Payer: 59 | Admitting: Neurology

## 2018-06-02 DIAGNOSIS — R202 Paresthesia of skin: Secondary | ICD-10-CM

## 2018-06-02 NOTE — Procedures (Addendum)
[facility]  [place], [place]  [place], [place] [address] Tel: [phone] Fax:  [phone] Test Date:  06/02/2018  Patient: Rodney Watkins, Rodney Watkins DOB: [birth year] Physician: Narda Amber, DO  Sex: Male Height: 6\' 0"  Ref Phys: Narda Amber, DO  ID#: [id number] Temp: 33.0 Technician:    Patient Complaints: This is a 63 year old man referred for evaluation of bilateral feet paresthesias and right medial thigh pain.  NCV & EMG Findings: Electrodiagnostic testing of the right lower extremity and additional studies of the left shows: 1. Bilateral sural and superficial peroneal sensory responses are within normal limits. 2. Bilateral peroneal and tibial motor responses are within normal limits. 3. Right tibial H reflex studies is within normal limits. 4. There is no evidence of active or chronic motor axonal changes affecting any of the tested muscles.  Motor unit configuration and recruitment pattern is within normal limits.  Impression: This is a normal study of the lower extremities.  In particular, there is no evidence of a large fiber sensorimotor polyneuropathy or lumbosacral radiculopathy.       ___________________________ Narda Amber, DO    Nerve Conduction Studies Anti Sensory Summary Table   Site NR Peak (ms) Norm Peak (ms) P-T Amp (V) Norm P-T Amp  Left Sup Peroneal Anti Sensory (Ant Lat Mall)  33C  12 cm    2.2 <4.6 11.5 >3  Right Sup Peroneal Anti Sensory (Ant Lat Mall)  33C  12 cm    2.8 <4.6 8.2 >3  Left Sural Anti Sensory (Lat Mall)  33C  Calf    3.7 <4.6 25.8 >3  Right Sural Anti Sensory (Lat Mall)  33C  Calf    3.3 <4.6 26.3 >3   Motor Summary Table   Site NR Onset (ms) Norm Onset (ms) O-P Amp (mV) Norm O-P Amp Site1 Site2 Delta-0 (ms) Dist (cm) Vel (m/s) Norm Vel (m/s)  Left Peroneal Motor (Ext Dig Brev)  33C  Ankle    3.7 <6.0 6.9 >2.5 B Fib Ankle 8.5 41.0 48 >40  B Fib    12.2  6.7  Poplt B Fib 1.4 9.0 64 >40  Poplt    13.6   6.6         Right Peroneal Motor (Ext Dig Brev)  33C  Ankle    3.1 <6.0 6.5 >2.5 B Fib Ankle 8.2 40.0 49 >40  B Fib    11.3  5.9  Poplt B Fib 1.4 9.0 64 >40  Poplt    12.7  5.8         Left Tibial Motor (Abd Hall Brev)  33C  Ankle    5.2 <6.0 14.0 >4 Knee Ankle 8.7 43.0 49 >40  Knee    13.9  9.3         Right Tibial Motor (Abd Hall Brev)  33C  Ankle    5.1 <6.0 11.5 >4 Knee Ankle 9.9 43.0 43 >40  Knee    15.0  6.6          H Reflex Studies   NR H-Lat (ms) Lat Norm (ms) L-R H-Lat (ms)  Right Tibial (Gastroc)  33C     32.11 <35    EMG   Side Muscle Ins Act Fibs Psw Fasc Number Recrt Dur Dur. Amp Amp. Poly Poly. Comment  Right AntTibialis Nml Nml Nml Nml Nml Nml Nml Nml Nml Nml Nml Nml N/A  Right Gastroc Nml Nml Nml Nml Nml Nml Nml Nml Nml Nml Nml Nml  N/A  Right Flex Dig Long Nml Nml Nml Nml Nml Nml Nml Nml Nml Nml Nml Nml N/A  Right RectFemoris Nml Nml Nml Nml Nml Nml Nml Nml Nml Nml Nml Nml N/A  Right GluteusMed Nml Nml Nml Nml Nml Nml Nml Nml Nml Nml Nml Nml N/A  Right AdductorLong Nml Nml Nml Nml Nml Nml Nml Nml Nml Nml Nml Nml N/A  Left AntTibialis Nml Nml Nml Nml Nml Nml Nml Nml Nml Nml Nml Nml N/A  Left Gastroc Nml Nml Nml Nml Nml Nml Nml Nml Nml Nml Nml Nml N/A  Left Flex Dig Long Nml Nml Nml Nml Nml Nml Nml Nml Nml Nml Nml Nml N/A  Left RectFemoris Nml Nml Nml Nml Nml Nml Nml Nml Nml Nml Nml Nml N/A  Left GluteusMed Nml Nml Nml Nml Nml Nml Nml Nml Nml Nml Nml Nml N/A      Waveforms:

## 2018-06-06 ENCOUNTER — Telehealth: Payer: Self-pay | Admitting: *Deleted

## 2018-06-06 ENCOUNTER — Encounter: Payer: Self-pay | Admitting: *Deleted

## 2018-06-06 NOTE — Telephone Encounter (Signed)
Results sent via My Chart.  

## 2018-06-06 NOTE — Telephone Encounter (Signed)
-----   Message from Alda Berthold, DO sent at 06/06/2018  9:59 AM EST ----- Please inform patient that his nerve testing his normal - no signs of nerve injury.  He is scheduled to return for EMG of the arms next week and we can discuss afterwards.

## 2018-06-08 ENCOUNTER — Encounter: Payer: Self-pay | Admitting: Family Medicine

## 2018-06-14 ENCOUNTER — Ambulatory Visit (INDEPENDENT_AMBULATORY_CARE_PROVIDER_SITE_OTHER): Payer: 59 | Admitting: Neurology

## 2018-06-14 DIAGNOSIS — R202 Paresthesia of skin: Secondary | ICD-10-CM

## 2018-06-14 DIAGNOSIS — G5603 Carpal tunnel syndrome, bilateral upper limbs: Secondary | ICD-10-CM

## 2018-06-14 NOTE — Procedures (Signed)
[facility]  [place]  [place], [place] [address] Tel: [phone] Fax:  [phone] Test Date:  06/14/2018  Patient: Rodney Watkins, Rodney Watkins DOB: 1955-12-02 Physician: Narda Amber, DO  Sex: Male Height: 6\' 0"  Ref Phys: Narda Amber, DO  ID#: [id number] Temp: 37.0C Technician:    Patient Complaints: This is a 62 year old man referred for evaluation of generalized paresthesias of bilateral hands.  NCV & EMG Findings: Extensive electrodiagnostic testing of the right upper extremity and additional studies of the left shows:  1. Bilateral mixed palmar sensory responses show prolonged latencies.  Bilateral median and ulnar sensory responses are within normal limits. 2. Bilateral median and ulnar motor responses are within normal limits. 3. There is no evidence of active or chronic motor axonal loss changes affecting any of the tested muscles.  Motor unit configuration and recruitment pattern is within normal limits.  Impression: Bilateral median neuropathy at or distal to the wrist, consistent with a clinical diagnosis of carpal tunnel syndrome.  Overall, these findings are mild in degree electrically.   ___________________________ Narda Amber, DO    Nerve Conduction Studies Anti Sensory Summary Table   Site NR Peak (ms) Norm Peak (ms) P-T Amp (V) Norm P-T Amp  Left Median Anti Sensory (2nd Digit)  37C  Wrist    3.8 <3.8 13.7 >10  Right Median Anti Sensory (2nd Digit)  37C  Wrist    3.4 <3.8 19.7 >10  Left Ulnar Anti Sensory (5th Digit)  37C  Wrist    2.9 <3.2 22.1 >5  Right Ulnar Anti Sensory (5th Digit)  37C  Wrist    2.8 <3.2 24.2 >5   Motor Summary Table   Site NR Onset (ms) Norm Onset (ms) O-P Amp (mV) Norm O-P Amp Site1 Site2 Delta-0 (ms) Dist (cm) Vel (m/s) Norm Vel (m/s)  Left Median Motor (Abd Poll Brev)  37C  Wrist    3.6 <4.0 7.0 >5 Elbow Wrist 5.5 31.0 56 >50  Elbow    9.1  6.7         Right Median Motor (Abd Poll Brev)  37C    Wrist    3.3 <4.0 8.8 >5 Elbow Wrist 5.1 30.0 59 >50  Elbow    8.4  7.6         Left Ulnar Motor (Abd Dig Minimi)  37C  Wrist    2.7 <3.1 11.9 >7 B Elbow Wrist 4.3 25.0 58 >50  B Elbow    7.0  11.5  A Elbow B Elbow 1.9 10.0 53 >50  A Elbow    8.9  11.2         Right Ulnar Motor (Abd Dig Minimi)  37C  Wrist    2.3 <3.1 11.3 >7 B Elbow Wrist 4.3 24.0 56 >50  B Elbow    6.6  10.9  A Elbow B Elbow 2.0 10.0 50 >50  A Elbow    8.6  10.0          Comparison Summary Table   Site NR Peak (ms) Norm Peak (ms) P-T Amp (V) Site1 Site2 Delta-P (ms) Norm Delta (ms)  Left Median/Ulnar Palm Comparison (Wrist - 8cm)  37C  Median Palm    2.5 <2.2 42.4 Median Palm Ulnar Palm 0.9   Ulnar Palm    1.6 <2.2 18.2      Right Median/Ulnar Palm Comparison (Wrist - 8cm)  37C  Median Palm    2.4 <2.2 21.9 Median Palm Ulnar Palm 0.8  Ulnar Palm    1.6 <2.2 14.9       EMG   Side Muscle Ins Act Fibs Psw Fasc Number Recrt Dur Dur. Amp Amp. Poly Poly. Comment  Right 1stDorInt Nml Nml Nml Nml Nml Nml Nml Nml Nml Nml Nml Nml N/A  Right Abd Poll Brev Nml Nml Nml Nml Nml Nml Nml Nml Nml Nml Nml Nml N/A  Right PronatorTeres Nml Nml Nml Nml Nml Nml Nml Nml Nml Nml Nml Nml N/A  Right Biceps Nml Nml Nml Nml Nml Nml Nml Nml Nml Nml Nml Nml N/A  Right Triceps Nml Nml Nml Nml Nml Nml Nml Nml Nml Nml Nml Nml N/A  Right Deltoid Nml Nml Nml Nml Nml Nml Nml Nml Nml Nml Nml Nml N/A  Left 1stDorInt Nml Nml Nml Nml Nml Nml Nml Nml Nml Nml Nml Nml N/A  Left Abd Poll Brev Nml Nml Nml Nml Nml Nml Nml Nml Nml Nml Nml Nml N/A  Left PronatorTeres Nml Nml Nml Nml Nml Nml Nml Nml Nml Nml Nml Nml N/A  Left Biceps Nml Nml Nml Nml Nml Nml Nml Nml Nml Nml Nml Nml N/A  Left Triceps Nml Nml Nml Nml Nml Nml Nml Nml Nml Nml Nml Nml N/A  Left Deltoid Nml Nml Nml Nml Nml Nml Nml Nml Nml Nml Nml Nml N/A      Waveforms:

## 2018-06-15 LAB — PSA: PSA: 0.033

## 2018-06-16 ENCOUNTER — Telehealth: Payer: Self-pay | Admitting: *Deleted

## 2018-06-16 ENCOUNTER — Encounter: Payer: Self-pay | Admitting: *Deleted

## 2018-06-16 NOTE — Telephone Encounter (Signed)
-----   Message from Alda Berthold, DO sent at 06/16/2018 12:05 PM EST ----- Please inform patient that his nerve testing shows mild carpal tunnel syndrome, which is most likely causing his hand tingling.  Try to avoid over flexing at the wrist and use a wrist brace at nighttime.  We can discuss more at his next visit.

## 2018-06-16 NOTE — Telephone Encounter (Signed)
Results and instructions sent via My Chart.  

## 2018-06-25 NOTE — Progress Notes (Signed)
Follow-up Visit   Date: 06/29/18   Rodney Watkins MRN: [id number] DOB: 1956-02-15   Interim History: Rodney Watkins is a 63 y.o. right-handed Caucasian male with hyperlipidemia, lumbar spondylosis, prostate cancer s/p robotic prostatectomy returning to the clinic for follow-up of generalized paresthesias. He is a retired Air[employer] pilot.  The patient was accompanied to the clinic by self.  History of present illness: In 01/2015, he was on an elliptical machine and began having hip pain.  He was evaluated by orthopaedic surgery and was found to have bilateral L5 pars defect which was radiating to his hip. He was referred to Dr. Saintclair Halsted who did not find any role for surgery and referred to Dr. Brien Few for pain management.    In January 2018, he was diagnosed with prostate cancer and prostatectomy.  Following his surgery, he could not raise his right leg which was thought to be secondary to obturator nerve injury.  He was also having pain from his going into right medial thigh.  He has tried epidural steroid injections, dry needling, and chiropractic adjustments with no benefit.  He was having some initial improvement with physical therapy with respect to right leg weakness, however he exercises aggravated his low back pain so this was stopped.  He also complains of generalized tingling of the feet and bilateral arms.  He is concerned that he cannot run and feels that there is lack of coordination.  He has no problems with walking or jogging.    He complaints of bilateral leg pain and tingling of the feet.  He is able to walk and job, but cannot run because he feels that his coordination is not there.  He complains of sharp pain from the groin into his medial nerve.    He has not history diabetes.  He drinks beer over the weekend on the weekends.    UPDATE 06/29/2018:   He is here for follow-up visit to discuss his results.  NCS/EMG of the legs is normal, he has mild carpal tunnel syndrome.   He continues to complain of severe right medial thigh pain, which is always triggered by driving > [id number].  Stretching the right leg and walking quickly resolves his pain. He does have home exercises for lumbar strain and admits to being noncompliant with following these recommendations.  He also complains of generalized pain, stiffness, and achiness of the muscles and joints.  He is taking turmeric for this.   Medications:  Current Outpatient Medications on File Prior to Visit  Medication Sig Dispense Refill  . atorvastatin (LIPITOR) 20 MG tablet TAKE 1 TABLET DAILY (Patient taking differently: 20 mg. ) 30 tablet 0  . calcium carbonate (TUMS - DOSED IN MG ELEMENTAL CALCIUM) 500 MG chewable tablet Chew 1-2 tablets by mouth daily as needed for indigestion or heartburn.    . cetirizine (ZYRTEC) 10 MG tablet Take 10 mg by mouth daily as needed for allergies.    . IBUPROFEN PO Take by mouth. As needed    . Multiple Vitamin (MULTIVITAMIN) tablet Take 1 tablet by mouth daily.    . Omega-3 Fatty Acids (FISH OIL PO) Take 1 capsule by mouth daily.     No current facility-administered medications on file prior to visit.     Allergies: No Known Allergies  Review of Systems:  CONSTITUTIONAL: No fevers, chills, night sweats, or weight loss.  EYES: No visual changes or eye pain ENT: No hearing changes.  No history of nose bleeds.  RESPIRATORY: No cough, wheezing and shortness of breath.   CARDIOVASCULAR: Negative for chest pain, and palpitations.   GI: Negative for abdominal discomfort, blood in stools or black stools.  No recent change in bowel habits.   GU:  No history of incontinence.   MUSCLOSKELETAL: No history of joint pain or swelling.  No myalgias.   SKIN: Negative for lesions, rash, and itching.   ENDOCRINE: Negative for cold or heat intolerance, polydipsia or goiter.   PSYCH:  No depression + anxiety symptoms.   NEURO: As Above.   Vital Signs:  BP 140/84   Pulse 85   Ht 6' (1.829  m)   Wt 210 lb 4 oz (95.4 kg)   SpO2 97%   BMI 28.52 kg/m    General Medical Exam:   General:  Well appearing, comfortable, slightly anxious appearing Eyes/ENT: see cranial nerve examination.   Neck:  No carotid bruits. Respiratory:  Clear to auscultation, good air entry bilaterally.   Cardiac:  Regular rate and rhythm, no murmur.   Ext:  No edema   Neurological Exam: MENTAL STATUS including orientation to time, place, person, recent and remote memory, attention span and concentration, language, and fund of knowledge is normal.  Speech is not dysarthric.  CRANIAL NERVES:  Face is symmetric. Palate elevates symmetrically.  Tongue is midline.  MOTOR:  Motor strength is 5/5 in all extremities.  No pronator drift.  Tone is normal.    MSRs:  Reflexes are 2+/4 throughout.  SENSORY:  Intact to vibration throughout.  COORDINATION/GAIT: Gait narrow based and stable.   Data:  MRI lumbar spine 07/09/2016: No change since 2016. 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 Degeneration and bulging of the disc. Mild foraminal narrowing bilaterally without visible compression of theexiting L5 nerve roots.  NCS/EMG of the legs 06/02/2018:  Normal  NCS/EMG of the arms 06/14/2018:  Bilateral median neuropathy at or distal to the wrist, consistent with a clinical diagnosis of carpal tunnel syndrome.  Overall, these findings are mild in degree electrically.   IMPRESSION/PLAN: 1.  Mild bilateral carpal tunnel syndrome causing bilateral hand paresthesias.  Start wearing wrist splint to minimize flexion at the wrist, he admits that he will not be compliant with this. I suggested that he try to be for vigilant about knowing specific triggers and keeping hands extended, such as when driving.   2.  Bilateral feet paresthesias, unclear etiology.  MRI lumbar spine does not show nerve impingement and no evidence of large fiber neuropathy on NCS.  Fortunately, symptoms are improving and  intermittent.  Proceed with skin biopsy for small fiber neuropathy, only if symptoms get worse  3.  Lumbar strain and right medial thigh pain.   PT and  muscle relaxants declined. He was encouraged to start stretching regimen for his low back and leg pain.    4.  Myalgias and polyarthralgia.  Reassured patient there is no evidence of myopathy.  If he continues to have diffuse pain, he may want to seek the opinion of rheumatology  Return to clinic as needed   Thank you for allowing me to participate in patient's care.  If I can answer any additional questions, I would be pleased to do so.    Sincerely,    Handy Mcloud K. Posey Pronto, DO

## 2018-06-29 ENCOUNTER — Encounter: Payer: Self-pay | Admitting: Family Medicine

## 2018-06-29 ENCOUNTER — Encounter: Payer: Self-pay | Admitting: Neurology

## 2018-06-29 ENCOUNTER — Ambulatory Visit (INDEPENDENT_AMBULATORY_CARE_PROVIDER_SITE_OTHER): Payer: 59 | Admitting: Neurology

## 2018-06-29 VITALS — BP 140/84 | HR 85 | Ht 72.0 in | Wt 210.2 lb

## 2018-06-29 DIAGNOSIS — M545 Low back pain: Secondary | ICD-10-CM

## 2018-06-29 DIAGNOSIS — M791 Myalgia, unspecified site: Secondary | ICD-10-CM | POA: Diagnosis not present

## 2018-06-29 DIAGNOSIS — G5603 Carpal tunnel syndrome, bilateral upper limbs: Secondary | ICD-10-CM | POA: Diagnosis not present

## 2018-06-29 DIAGNOSIS — M79651 Pain in right thigh: Secondary | ICD-10-CM | POA: Diagnosis not present

## 2018-06-29 DIAGNOSIS — G8929 Other chronic pain: Secondary | ICD-10-CM

## 2018-06-29 NOTE — Patient Instructions (Addendum)
Start an stretching exercise program  You can use wrist splints, if your hand tingling/numbness gets worse  Return to clinic as needed

## 2018-07-01 ENCOUNTER — Encounter: Payer: Self-pay | Admitting: Family Medicine

## 2018-07-03 ENCOUNTER — Encounter: Payer: Self-pay | Admitting: Family Medicine

## 2018-07-04 ENCOUNTER — Ambulatory Visit: Payer: 59 | Admitting: Neurology

## 2018-07-04 ENCOUNTER — Encounter

## 2018-07-04 ENCOUNTER — Other Ambulatory Visit: Payer: Self-pay | Admitting: Family Medicine

## 2018-07-04 MED ORDER — ATORVASTATIN CALCIUM 20 MG PO TABS: 20.0000 mg | ORAL_TABLET | Freq: Every day | ORAL | 1 refills | Status: DC

## 2018-07-04 NOTE — Telephone Encounter (Signed)
Copied from [place] [phone]. Topic: Quick Communication - Rx Refill/Question >> Jul 04, 2018 10:08 AM Burchel, Abbi R wrote: Medication: atorvastatin (LIPITOR) 20 MG tablet   Preferred Pharmacy: [facility], [address] [place] [address] Phone: [phone] Fax: [phone]    Pt was advised that RX refills may take up to 3 business days. We ask that you follow-up with your pharmacy.

## 2018-11-22 ENCOUNTER — Encounter: Payer: 59 | Admitting: Family Medicine

## 2018-12-08 ENCOUNTER — Ambulatory Visit (INDEPENDENT_AMBULATORY_CARE_PROVIDER_SITE_OTHER): Payer: 59 | Admitting: Family Medicine

## 2018-12-08 ENCOUNTER — Other Ambulatory Visit: Payer: Self-pay

## 2018-12-08 ENCOUNTER — Encounter: Payer: Self-pay | Admitting: Family Medicine

## 2018-12-08 VITALS — BP 122/80 | HR 84 | Temp 98.4°F | Resp 17 | Ht 72.0 in | Wt 202.4 lb

## 2018-12-08 DIAGNOSIS — E7800 Pure hypercholesterolemia, unspecified: Secondary | ICD-10-CM

## 2018-12-08 DIAGNOSIS — M79604 Pain in right leg: Secondary | ICD-10-CM | POA: Diagnosis not present

## 2018-12-08 DIAGNOSIS — Z0000 Encounter for general adult medical examination without abnormal findings: Secondary | ICD-10-CM

## 2018-12-08 DIAGNOSIS — Z1159 Encounter for screening for other viral diseases: Secondary | ICD-10-CM

## 2018-12-08 DIAGNOSIS — R29898 Other symptoms and signs involving the musculoskeletal system: Secondary | ICD-10-CM | POA: Diagnosis not present

## 2018-12-08 DIAGNOSIS — Z114 Encounter for screening for human immunodeficiency virus [HIV]: Secondary | ICD-10-CM

## 2018-12-08 DIAGNOSIS — M2550 Pain in unspecified joint: Secondary | ICD-10-CM

## 2018-12-08 DIAGNOSIS — M792 Neuralgia and neuritis, unspecified: Secondary | ICD-10-CM | POA: Diagnosis not present

## 2018-12-08 DIAGNOSIS — R7301 Impaired fasting glucose: Secondary | ICD-10-CM

## 2018-12-08 DIAGNOSIS — E663 Overweight: Secondary | ICD-10-CM | POA: Insufficient documentation

## 2018-12-08 DIAGNOSIS — T887XXA Unspecified adverse effect of drug or medicament, initial encounter: Secondary | ICD-10-CM

## 2018-12-08 NOTE — Patient Instructions (Addendum)
Stop your atorvastatin for 2 weeks.  If your joint pains resolve completely then restart the atorvastatin daily and see if your joint pains return.  If joint pains do return then stop the atorvastatin again.   Health Maintenance, Male Adopting a healthy lifestyle and getting preventive care are important in promoting health and wellness. Ask your health care provider about:  The right schedule for you to have regular tests and exams.  Things you can do on your own to prevent diseases and keep yourself healthy. What should I know about diet, weight, and exercise? Eat a healthy diet   Eat a diet that includes plenty of vegetables, fruits, low-fat dairy products, and lean protein.  Do not eat a lot of foods that are high in solid fats, added sugars, or sodium. Maintain a healthy weight Body mass index (BMI) is a measurement that can be used to identify possible weight problems. It estimates body fat based on height and weight. Your health care provider can help determine your BMI and help you achieve or maintain a healthy weight. Get regular exercise Get regular exercise. This is one of the most important things you can do for your health. Most adults should:  Exercise for at least 150 minutes each week. The exercise should increase your heart rate and make you sweat (moderate-intensity exercise).  Do strengthening exercises at least twice a week. This is in addition to the moderate-intensity exercise.  Spend less time sitting. Even light physical activity can be beneficial. Watch cholesterol and blood lipids Have your blood tested for lipids and cholesterol at 61 years of age, then have this test every 5 years. You may need to have your cholesterol levels checked more often if:  Your lipid or cholesterol levels are high.  You are older than 61 years of age.  You are at high risk for heart disease. What should I know about cancer screening? Many types of cancers can be detected early  and may often be prevented. Depending on your health history and family history, you may need to have cancer screening at various ages. This may include screening for:  Colorectal cancer.  Prostate cancer.  Skin cancer.  Lung cancer. What should I know about heart disease, diabetes, and high blood pressure? Blood pressure and heart disease  High blood pressure causes heart disease and increases the risk of stroke. This is more likely to develop in people who have high blood pressure readings, are of African descent, or are overweight.  Talk with your health care provider about your target blood pressure readings.  Have your blood pressure checked: ? Every 3-5 years if you are 35-61 years of age. ? Every year if you are 41 years old or older.  If you are between the ages of 85 and 75 and are a current or former smoker, ask your health care provider if you should have a one-time screening for abdominal aortic aneurysm (AAA). Diabetes Have regular diabetes screenings. This checks your fasting blood sugar level. Have the screening done:  Once every three years after age 61 if you are at a normal weight and have a low risk for diabetes.  More often and at a younger age if you are overweight or have a high risk for diabetes. What should I know about preventing infection? Hepatitis B If you have a higher risk for hepatitis B, you should be screened for this virus. Talk with your health care provider to find out if you are at risk  for hepatitis B infection. Hepatitis C Blood testing is recommended for:  Everyone born from [birth year] through 1963.  Anyone with known risk factors for hepatitis C. Sexually transmitted infections (STIs)  You should be screened each year for STIs, including gonorrhea and chlamydia, if: ? You are sexually active and are younger than 61 years of age. ? You are older than 61 years of age and your health care provider tells you that you are at risk for this type of  infection. ? Your sexual activity has changed since you were last screened, and you are at increased risk for chlamydia or gonorrhea. Ask your health care provider if you are at risk.  Ask your health care provider about whether you are at high risk for HIV. Your health care provider may recommend a prescription medicine to help prevent HIV infection. If you choose to take medicine to prevent HIV, you should first get tested for HIV. You should then be tested every 3 months for as long as you are taking the medicine. Follow these instructions at home: Lifestyle  Do not use any products that contain nicotine or tobacco, such as cigarettes, e-cigarettes, and chewing tobacco. If you need help quitting, ask your health care provider.  Do not use street drugs.  Do not share needles.  Ask your health care provider for help if you need support or information about quitting drugs. Alcohol use  Do not drink alcohol if your health care provider tells you not to drink.  If you drink alcohol: ? Limit how much you have to 0-2 drinks a day. ? Be aware of how much alcohol is in your drink. In the [place] one drink equals one 12 oz bottle of beer (355 mL), one 5 oz glass of wine (148 mL), or one 1 oz glass of hard liquor (44 mL). General instructions  Schedule regular health, dental, and eye exams.  Stay current with your vaccines.  Tell your health care provider if: ? You often feel depressed. ? You have ever been abused or do not feel safe at home. Summary  Adopting a healthy lifestyle and getting preventive care are important in promoting health and wellness.  Follow your health care provider's instructions about healthy diet, exercising, and getting tested or screened for diseases.  Follow your health care provider's instructions on monitoring your cholesterol and blood pressure. This information is not intended to replace advice given to you by your health care provider. Make sure you  discuss any questions you have with your health care provider. Document Released: 10/24/2007 Document Revised: 04/20/2018 Document Reviewed: 04/20/2018 Elsevier Patient Education  2020 [employer].

## 2018-12-08 NOTE — Progress Notes (Signed)
Office Note 12/08/2018  CC:  Chief Complaint  Patient presents with  . Annual Exam    not fasting. has questions about blood work   HPI:  Rodney Watkins is a 63 y.o. White male who is here for annual health maintenance exam.  Pt states he has had significant joint pains, particularly knees, since starting atorvastatin. No joint swelling, no rash.  Neuro eval by Dr. Posey Pronto with [facility] neurologic not much found on testing per pt except some CTS bilat. However, he was sent for neurorehab for intermittent right leg pain and weakness thought to be from nerve damage/dysfunction after radical prostatectomy surgery.  The therapist did lots of anal/rectal nerve conduction testing and exercises to help with strengthening pelvic/hip muscles that were not functioning normally.  He says he benefited from his neuro rehab a lot and wants to get another round of it. Of note, R thigh found to be 13 cm smaller in circumference when urologist measured it at last exam. Has post-surgical ED.  Erection ability on/off per pt.  Libido intact.   Past Medical History:  Diagnosis Date  . CTS (carpal tunnel syndrome)    bilat (NCS/EMG - confirmed 06/12/18)  . Diastasis of rectus abdominis   . Erectile dysfunction    sildenafil prn helpful  . History of kidney stones    bladder  . Hyperlipidemia    Mild.  No meds in past records.  Recommended statin 05/2017 but pt wanted to try TLC first.  Lipids unchanged 09/2017 so on 09/09/17 he started atorva 20mg .  . IFG (impaired fasting glucose)    Per old records.  A1c 5.7% 05/2017.  A1c 5.3% 10/2017.  . Incisional hernia without obstruction or gangrene 05/2017  . Lumbar spondylosis    w/pars defect and listhesis (Dr. Saintclair Halsted).  Referred by neurosurg to pain mgmt MD and pt got ESI x 2 ---no help.  . Paresthesias    UEs and LE's 2019---recommended neuro referral and pt wanted to think about it.  Then, Dr. Posey Pronto evaluated him 05/2018 and ordered NCS/EMGs to further eval  feet paresthesias.  . Prostate cancer [facility])    Locally advanced prostate ca 06/2016.  Prostatectomy  RAL radical prostatectomy 07/2016.  PSA undectable as of 06/2018 urol f/u.  Marland Kitchen Pulmonary nodule 2009   Stable CT chest 2010--no further scans needed in pt at low risk for lung ca.  . 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    Past Surgical History:  Procedure Laterality Date  . COLONOSCOPY  04/20/11   Normal: recall 10 yrs  . ett  approx 2010   normal  . KNEE ARTHROSCOPY  2000   left  . LYMPHADENECTOMY Bilateral 08/06/2016   Procedure: PELVIC LYMPHADENECTOMY;  Surgeon: Raynelle Bring, MD;  Location: [facility];  Service: Urology;  Laterality: Bilateral;  . NCV & EMG  06/02/2018; 06/14/2018   07/03/2018 (LEGS)->NORMAL.  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  Marland Kitchen REFRACTIVE SURGERY  2000   both eyes  . ROBOT ASSISTED LAPAROSCOPIC RADICAL PROSTATECTOMY N/A 08/06/2016   Procedure: Path: adenocarcinoma with extraprostatic extension present, negative margins, lymph nodes NEG. XI ROBOTIC ASSISTED LAPAROSCOPIC RADICAL PROSTATECTOMY LEVEL 2;  Surgeon: Raynelle Bring, MD;  Location: [facility];  Service: Urology;  Laterality: N/A;  . ROTATOR CUFF REPAIR  2002   right  . ROTATOR CUFF REPAIR  2003   left    Family History  Problem Relation Age of Onset  . Diabetes Father   . Cancer Sister        unknown  . Colon cancer Neg Hx   . Stomach cancer Neg Hx     Social History   Socioeconomic History  . Marital status: Married    Spouse name: Rise Paganini  . Number of children: 3  . Years of education: Not on file  . Highest education level: Bachelor's degree (e.g., BA, AB, BS)  Occupational History  . Occupation: FIRE DEPARTMENT    Employer: [employer]    Comment: Retired  Scientific laboratory technician  . Financial resource strain: Not on file  . Food insecurity    Worry: Not on file     Inability: Not on file  . Transportation needs    Medical: Not on file    Non-medical: Not on file  Tobacco Use  . Smoking status: Former Smoker    Quit date: 04/08/1982    Years since quitting: 36.6  . Smokeless tobacco: Never Used  Substance and Sexual Activity  . Alcohol use: Yes    Alcohol/week: 3.0 standard drinks    Types: 3 Cans of beer per week  . Drug use: No  . Sexual activity: Yes  Lifestyle  . Physical activity    Days per week: Not on file    Minutes per session: Not on file  . Stress: Not on file  Relationships  . Social Herbalist on phone: Not on file    Gets together: Not on file    Attends religious service: Not on file    Active member of club or organization: Not on file    Attends meetings of clubs or organizations: Not on file    Relationship status: Not on file  . Intimate partner violence    Fear of current or ex partner: Not on file    Emotionally abused: Not on file    Physically abused: Not on file    Forced sexual activity: Not on file  Other Topics Concern  . Not on file  Social History Narrative   Married, 3 daughters.   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   No T/A/Ds.         Pt is right-handed. He lives with his wife and three daughters in a two level home. Prior to prostate cancer surgery in 2018, he exercised regularly. He walks now, weather permitting.    Outpatient Medications Prior to Visit  Medication Sig Dispense Refill  . atorvastatin (LIPITOR) 20 MG tablet Take 1 tablet (20 mg total) by mouth daily. 90 tablet 1  . calcium carbonate (TUMS - DOSED IN MG ELEMENTAL CALCIUM) 500 MG chewable tablet Chew 1-2 tablets by mouth daily as needed for indigestion or heartburn.    . cetirizine (ZYRTEC) 10 MG tablet Take 10 mg by mouth daily as needed for allergies.    . IBUPROFEN PO Take by mouth. As needed    . Multiple Vitamin (MULTIVITAMIN) tablet Take 1  tablet by mouth daily.    . Omega-3 Fatty Acids (FISH OIL PO) Take 1 capsule by mouth daily.     No facility-administered medications prior to visit.     No Known Allergies  ROS Review of Systems  Constitutional: Negative for appetite change, chills, fatigue and fever.  HENT: Negative for congestion, dental problem, ear pain and sore throat.   Eyes:  Negative for discharge, redness and visual disturbance.  Respiratory: Negative for cough, chest tightness, shortness of breath and wheezing.   Cardiovascular: Negative for chest pain, palpitations and leg swelling.  Gastrointestinal: Negative for abdominal pain, blood in stool, diarrhea, nausea and vomiting.  Genitourinary: Negative for difficulty urinating, dysuria, flank pain, frequency, hematuria and urgency.       Erectile dysfunction starting 1-2 mo ago  Musculoskeletal: Positive for arthralgias. Negative for back pain, joint swelling, myalgias and neck stiffness.  Skin: Negative for pallor and rash.  Neurological: Negative for dizziness, speech difficulty, weakness and headaches.  Hematological: Negative for adenopathy. Does not bruise/bleed easily.  Psychiatric/Behavioral: Negative for confusion and sleep disturbance. The patient is not nervous/anxious.     PE; Blood pressure 122/80, pulse 84, temperature 98.4 F (36.9 C), temperature source Temporal, resp. rate 17, height 6' (1.829 m), weight 202 lb 6 oz (91.8 kg), SpO2 94 %.  Body mass index is 27.45 kg/m.  Gen: Alert, well appearing.  Patient is oriented to person, place, time, and situation. AFFECT: pleasant, lucid thought and speech. ENT: Ears: EACs clear, normal epithelium.  TMs with good light reflex and landmarks bilaterally.  Eyes: no injection, icteris, swelling, or exudate.  EOMI, PERRLA. Nose: no drainage or turbinate edema/swelling.  No injection or focal lesion.  Mouth: lips without lesion/swelling.  Oral mucosa pink and moist.  Dentition intact and without obvious  caries or gingival swelling.  Oropharynx without erythema, exudate, or swelling.  Neck: supple/nontender.  No LAD, mass, or TM.  Carotid pulses 2+ bilaterally, without bruits. CV: RRR, no m/r/g.   LUNGS: CTA bilat, nonlabored resps, good aeration in all lung fields. ABD: soft, NT, ND, BS normal.  No hepatospenomegaly or mass.  No bruits. EXT: no clubbing, cyanosis, or edema.  Musculoskeletal: no joint swelling, erythema, warmth, or tenderness.  ROM of all joints intact. Skin - no sores or suspicious lesions or rashes or color changes Legs: strength 5/5 prox and dist bilat.  R thigh does appear a bit smaller than L.  Pertinent labs:  Lab Results  Component Value Date   TSH 2.13 01/06/2017   Lab Results  Component Value Date   WBC 5.2 01/06/2017   HGB 15.4 01/06/2017   HCT 46 01/06/2017   MCV 89.1 08/04/2016   PLT 275 01/06/2017   Lab Results  Component Value Date   CREATININE 0.99 11/02/2017   BUN 13 11/02/2017   NA 139 11/02/2017   K 4.4 11/02/2017   CL 107 11/02/2017   CO2 24 11/02/2017   Lab Results  Component Value Date   ALT 33 01/06/2017   AST 45 (A) 01/06/2017   ALKPHOS 60 01/06/2017   Lab Results  Component Value Date   CHOL 126 11/02/2017   Lab Results  Component Value Date   HDL 55 11/02/2017   Lab Results  Component Value Date   LDLCALC 59 11/02/2017   Lab Results  Component Value Date   TRIG 43 11/02/2017   Lab Results  Component Value Date   CHOLHDL 2.3 11/02/2017   Lab Results  Component Value Date   PSA 0.033 06/15/2018   Lab Results  Component Value Date   HGBA1C 5.3 11/02/2017     ASSESSMENT AND PLAN:   1) Right leg (proximal >distal) intermittent pain and weakness-->secondary to peripheral nerve damage sustained during prostatectomy.  Neuro rehab helped in the past.  Will try to arrange this again at the same place as last time [facility]).  2) Arthralgias (primarily knees): fluctuating symptoms, suspect  possible statin side effect. Instructions: Stop your atorvastatin for 2 weeks.  If your joint pains resolve completely then restart the atorvastatin daily and see if your joint pains return.  If joint pains do return then stop the atorvastatin again. Already discussed possible need for trial of different statin with pt and he is fine with this.  We'll see how things go.  3) Health maintenance exam: Reviewed age and gender appropriate health maintenance issues (prudent diet, regular exercise, health risks of tobacco and excessive alcohol, use of seatbelts, fire alarms in home, use of sunscreen).  Also reviewed age and gender appropriate health screening as well as vaccine recommendations. Vaccines: all UTD. Labs: return for fasting HP and HbA1c (IFG).  Prostate ca screening: hx of prostate ca and rad prostatectomy; followed by urol-->as of 06/2018 PSAs undetectable. Colon ca screening: next colonoscopy due 04/2021.  An After Visit Summary was printed and given to the patient.  FOLLOW UP:  Return in about 4 weeks (around 01/05/2019) for f/u joint pains, question of atorvastatin side effect.  Signed:  Crissie Sickles, MD           12/08/2018

## 2018-12-13 ENCOUNTER — Ambulatory Visit (INDEPENDENT_AMBULATORY_CARE_PROVIDER_SITE_OTHER): Payer: 59 | Admitting: Family Medicine

## 2018-12-13 ENCOUNTER — Other Ambulatory Visit: Payer: Self-pay

## 2018-12-13 DIAGNOSIS — E7800 Pure hypercholesterolemia, unspecified: Secondary | ICD-10-CM | POA: Diagnosis not present

## 2018-12-13 DIAGNOSIS — R7301 Impaired fasting glucose: Secondary | ICD-10-CM

## 2018-12-14 ENCOUNTER — Encounter: Payer: Self-pay | Admitting: Family Medicine

## 2018-12-14 LAB — CBC WITH DIFFERENTIAL/PLATELET
Absolute Monocytes: 441 cells/uL (ref 200–950)
Basophils Absolute: 59 cells/uL (ref 0–200)
Basophils Relative: 1.2 %
Eosinophils Absolute: 98 cells/uL (ref 15–500)
Eosinophils Relative: 2 %
HCT: 46.2 % (ref 38.5–50.0)
Hemoglobin: 15.9 g/dL (ref 13.2–17.1)
Lymphs Abs: 1294 cells/uL (ref 850–3900)
MCH: 31.1 pg (ref 27.0–33.0)
MCHC: 34.4 g/dL (ref 32.0–36.0)
MCV: 90.4 fL (ref 80.0–100.0)
MPV: 10.9 fL (ref 7.5–12.5)
Monocytes Relative: 9 %
Neutro Abs: 3009 cells/uL (ref 1500–7800)
Neutrophils Relative %: 61.4 %
Platelets: 276 10*3/uL (ref 140–400)
RBC: 5.11 10*6/uL (ref 4.20–5.80)
RDW: 13.1 % (ref 11.0–15.0)
Total Lymphocyte: 26.4 %
WBC: 4.9 10*3/uL (ref 3.8–10.8)

## 2018-12-14 LAB — COMPREHENSIVE METABOLIC PANEL
AG Ratio: 1.8 (calc) (ref 1.0–2.5)
ALT: 19 U/L (ref 9–46)
AST: 18 U/L (ref 10–35)
Albumin: 4.4 g/dL (ref 3.6–5.1)
Alkaline phosphatase (APISO): 52 U/L (ref 35–144)
BUN: 16 mg/dL (ref 7–25)
CO2: 25 mmol/L (ref 20–32)
Calcium: 9.9 mg/dL (ref 8.6–10.3)
Chloride: 107 mmol/L (ref 98–110)
Creat: 1.07 mg/dL (ref 0.70–1.25)
Globulin: 2.4 g/dL (calc) (ref 1.9–3.7)
Glucose, Bld: 109 mg/dL — ABNORMAL HIGH (ref 65–99)
Potassium: 4.9 mmol/L (ref 3.5–5.3)
Sodium: 139 mmol/L (ref 135–146)
Total Bilirubin: 0.7 mg/dL (ref 0.2–1.2)
Total Protein: 6.8 g/dL (ref 6.1–8.1)

## 2018-12-14 LAB — LIPID PANEL
Cholesterol: 176 mg/dL (ref <200)
HDL: 53 mg/dL (ref >=40)
LDL Cholesterol (Calc): 108 mg/dL (calc) — ABNORMAL HIGH
Non-HDL Cholesterol (Calc): 123 mg/dL (calc) (ref <130)
Total CHOL/HDL Ratio: 3.3 (calc) (ref <5.0)
Triglycerides: 61 mg/dL (ref <150)

## 2018-12-14 LAB — HEMOGLOBIN A1C
Hgb A1c MFr Bld: 5.5 % of total Hgb (ref <5.7)
Mean Plasma Glucose: 111 (calc)
eAG (mmol/L): 6.2 (calc)

## 2018-12-14 LAB — TSH: TSH: 1.33 mIU/L (ref 0.40–4.50)

## 2018-12-28 ENCOUNTER — Other Ambulatory Visit: Payer: Self-pay | Admitting: Family Medicine

## 2019-01-18 ENCOUNTER — Encounter: Payer: Self-pay | Admitting: Family Medicine

## 2019-01-18 ENCOUNTER — Ambulatory Visit (INDEPENDENT_AMBULATORY_CARE_PROVIDER_SITE_OTHER): Payer: 59 | Admitting: Family Medicine

## 2019-01-18 ENCOUNTER — Other Ambulatory Visit: Payer: Self-pay

## 2019-01-18 VITALS — BP 128/82 | HR 82 | Temp 98.4°F | Resp 16 | Ht 72.0 in | Wt 205.4 lb

## 2019-01-18 DIAGNOSIS — M47816 Spondylosis without myelopathy or radiculopathy, lumbar region: Secondary | ICD-10-CM | POA: Diagnosis not present

## 2019-01-18 DIAGNOSIS — M791 Myalgia, unspecified site: Secondary | ICD-10-CM

## 2019-01-18 DIAGNOSIS — M79651 Pain in right thigh: Secondary | ICD-10-CM

## 2019-01-18 DIAGNOSIS — T466X5A Adverse effect of antihyperlipidemic and antiarteriosclerotic drugs, initial encounter: Secondary | ICD-10-CM | POA: Diagnosis not present

## 2019-01-18 MED ORDER — PRAVASTATIN SODIUM 20 MG PO TABS: 20.0000 mg | ORAL_TABLET | Freq: Every day | ORAL | 3 refills | Status: DC

## 2019-01-18 NOTE — Progress Notes (Addendum)
OFFICE VISIT  01/18/2019   CC:  Chief Complaint  Patient presents with  . Follow-up    joint pains   HPI:   Patient is a 63 y.o. Caucasian male who presents for 6 wk f/u post-radic prostatectomy nerve damage pain and myalgias felt to be coming from his atorvastatin. Plan was to get him back into pelvic PT via [facility] AND to stop atorvastatin and see how his myalgias go.  Interim hx: Reviewed all labs from 12/13/18 again in detail today. All were stable, no changes in regimen were made.  Myalgias/arthralgias: his pain is much improved since getting off atorvastatin for the last months, esp knees. He wants to try a diff statin.  Regarding his R thigh pain with atrophic thigh muscles, he saw new MD at [facility] neurolog[facility] associates and his suspicion is that he is having lumbar spine radiculopathy.  Another L spine MRI was recommended and pt wanted to think about it first. He asks my opinion today about getting another MRI L spine.  Of note, he did not get PT at that time like we had intended.   No new problems. We reviewed his labs from 12/13/18 in detail today: all stable.  Past Medical History:  Diagnosis Date  . CTS (carpal tunnel syndrome)    bilat (NCS/EMG - confirmed 06/12/18)  . Diastasis of rectus abdominis   . Erectile dysfunction    sildenafil prn helpful  . History of kidney stones    bladder  . Hyperlipidemia    Mild.  No meds in past records.  Recommended statin 05/2017 but pt wanted to try TLC first.  Lipids unchanged 09/2017 so on 09/09/17 he started atorva 20mg .  . IFG (impaired fasting glucose)    Per old records.  A1c 5.7% 05/2017.  A1c 5.3% 10/2017.  . Incisional hernia without obstruction or gangrene 05/2017  . Lumbar spondylosis    w/pars defect and listhesis (Dr. Saintclair Halsted).  Referred by neurosurg to pain mgmt MD and pt got ESI x 2 ---no help.  . Paresthesias    UEs and LE's 2019---recommended neuro referral and pt wanted to think about it.  Then, Dr.  Posey Pronto evaluated him 05/2018 and ordered NCS/EMGs to further eval feet paresthesias.  . Prostate cancer [facility])    Locally advanced prostate ca 06/2016.  Prostatectomy  RAL radical prostatectomy 07/2016.  PSA undectable as of 06/2018 urol f/u.  Marland Kitchen Pulmonary nodule 2009   Stable CT chest 2010--no further scans needed in pt at low risk for lung ca.  . 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    Past Surgical History:  Procedure Laterality Date  . COLONOSCOPY  04/20/11   Normal: recall 10 yrs  . ett  approx 2010   normal  . KNEE ARTHROSCOPY  2000   left  . LYMPHADENECTOMY Bilateral 08/06/2016   Procedure: PELVIC LYMPHADENECTOMY;  Surgeon: Raynelle Bring, MD;  Location: [facility];  Service: Urology;  Laterality: Bilateral;  . NCV & EMG  06/02/2018; 06/14/2018   07/03/2018 (LEGS)->NORMAL.  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  Marland Kitchen REFRACTIVE SURGERY  2000   both eyes  . ROBOT ASSISTED LAPAROSCOPIC RADICAL PROSTATECTOMY N/A 08/06/2016   Procedure: Path: adenocarcinoma with extraprostatic extension present, negative margins, lymph nodes NEG. XI ROBOTIC ASSISTED LAPAROSCOPIC RADICAL PROSTATECTOMY LEVEL 2;  Surgeon: Raynelle Bring, MD;  Location: [facility];  Service: Urology;  Laterality: N/A;  .  ROTATOR CUFF REPAIR  2002   right  . ROTATOR CUFF REPAIR  2003   left    Outpatient Medications Prior to Visit  Medication Sig Dispense Refill  . calcium carbonate (TUMS - DOSED IN MG ELEMENTAL CALCIUM) 500 MG chewable tablet Chew 1-2 tablets by mouth daily as needed for indigestion or heartburn.    . cetirizine (ZYRTEC) 10 MG tablet Take 10 mg by mouth daily as needed for allergies.    . IBUPROFEN PO Take by mouth. As needed    . Multiple Vitamin (MULTIVITAMIN) tablet Take 1 tablet by mouth daily.    . Omega-3 Fatty Acids (FISH OIL PO) Take 1 capsule by mouth daily.    Marland Kitchen atorvastatin (LIPITOR)  20 MG tablet TAKE 1 TABLET DAILY (Patient not taking: Reported on 01/18/2019) 90 tablet 1   No facility-administered medications prior to visit.     Allergies  Allergen Reactions  . Atorvastatin Other (See Comments)    Myalgias/arthralgias    ROS As per HPI  PE: Blood pressure 128/82, pulse 82, temperature 98.4 F (36.9 C), temperature source Temporal, resp. rate 16, height 6' (1.829 m), weight 205 lb 6.4 oz (93.2 kg), SpO2 93 %. Gen: Alert, well appearing.  Patient is oriented to person, place, time, and situation. AFFECT: pleasant, lucid thought and speech. R thigh smaller in circ than L on simple observation. LE strength 5/5 prox and dist Bilat. No sensory deficit.  Patellar and achilles DTRs are trace/symmetric.   No joint swelling or erythema.  No soft tissue or joint tenderness. SKIN: no rash.  LABS:  Lab Results  Component Value Date   TSH 1.33 12/13/2018   Lab Results  Component Value Date   WBC 4.9 12/13/2018   HGB 15.9 12/13/2018   HCT 46.2 12/13/2018   MCV 90.4 12/13/2018   PLT 276 12/13/2018   Lab Results  Component Value Date   CREATININE 1.07 12/13/2018   BUN 16 12/13/2018   NA 139 12/13/2018   K 4.9 12/13/2018   CL 107 12/13/2018   CO2 25 12/13/2018   Lab Results  Component Value Date   ALT 19 12/13/2018   AST 18 12/13/2018   ALKPHOS 60 01/06/2017   BILITOT 0.7 12/13/2018   Lab Results  Component Value Date   CHOL 176 12/13/2018   Lab Results  Component Value Date   HDL 53 12/13/2018   Lab Results  Component Value Date   LDLCALC 108 (H) 12/13/2018   Lab Results  Component Value Date   TRIG 61 12/13/2018   Lab Results  Component Value Date   CHOLHDL 3.3 12/13/2018   Lab Results  Component Value Date   PSA 0.033 06/15/2018   Lab Results  Component Value Date   HGBA1C 5.5 12/13/2018   Lab Results  Component Value Date   CKTOTAL 61 11/02/2017    IMPRESSION AND PLAN:  1) Statin-induced myalgias/arthralgias (atorva). He  has not tried any other statins. Will start trial of pravastatin 20mg  qd and see how he does. If tolerating this at f/u appt in 2 mo then we'll recheck FLP and AST/ALT.  2) Right thigh pain, with hx of intermittent LBP (hx of lumbar spondylosis and pars defect. He has failed ESI multiple times in the past. Recent eval by [facility] neurologic ass[facility]->I recommended he contact them and say that he is in favor of pursuing L spine MRI like they mentioned.  An After Visit Summary was printed and given to the patient.  FOLLOW UP:  Return in about 2 months (around 03/20/2019) for f/u hyperchol/statin induced myalgias.  Signed:  Crissie Sickles, MD           01/18/2019

## 2019-01-23 LAB — PSA: PSA: 0.067

## 2019-01-25 ENCOUNTER — Encounter: Payer: Self-pay | Admitting: Family Medicine

## 2019-01-26 ENCOUNTER — Other Ambulatory Visit: Payer: Self-pay

## 2019-04-20 ENCOUNTER — Other Ambulatory Visit: Payer: Self-pay

## 2019-04-20 MED ORDER — PRAVASTATIN SODIUM 20 MG PO TABS: 20.0000 mg | ORAL_TABLET | Freq: Every day | ORAL | 3 refills | Status: DC

## 2019-04-20 NOTE — Telephone Encounter (Signed)
RF request for Pravastatin 20mg  LOV: 01/18/19 Next ov: advised to f/u 2 mo.  Last written:01/18/19 (30,3)  Pt is requesting refill using mail order pharmacy for #90 w/ 3 RF's. Please advise, thanks. Medication pending

## 2019-05-01 ENCOUNTER — Encounter: Payer: Self-pay | Admitting: Family

## 2019-05-01 ENCOUNTER — Ambulatory Visit (INDEPENDENT_AMBULATORY_CARE_PROVIDER_SITE_OTHER): Payer: Managed Care, Other (non HMO) | Admitting: Family

## 2019-05-01 ENCOUNTER — Telehealth: Payer: Self-pay | Admitting: Family Medicine

## 2019-05-01 DIAGNOSIS — J40 Bronchitis, not specified as acute or chronic: Secondary | ICD-10-CM

## 2019-05-01 DIAGNOSIS — J019 Acute sinusitis, unspecified: Secondary | ICD-10-CM | POA: Diagnosis not present

## 2019-05-01 MED ORDER — ALBUTEROL SULFATE HFA 108 (90 BASE) MCG/ACT IN AERS: 2.0000 | INHALATION_SPRAY | Freq: Four times a day (QID) | RESPIRATORY_TRACT | 0 refills | Status: DC | PRN

## 2019-05-01 MED ORDER — DOXYCYCLINE HYCLATE 100 MG PO TABS: 100.0000 mg | ORAL_TABLET | Freq: Two times a day (BID) | ORAL | 0 refills | Status: DC

## 2019-05-01 NOTE — Progress Notes (Signed)
Virtual Visit via Video Note  I connected with Rodney Watkins on 05/01/19 at 12:00 PM EST by a video enabled telemedicine application and verified that I am speaking with the correct person using two identifiers. Unfortunately there was an audio issue and we had to transition to the telephone.   Location: Patient: home Provider: home   I discussed the limitations of evaluation and management by telemedicine and the availability of in person appointments. The patient expressed understanding and agreed to proceed.  History of Present Illness:  Patient is a 63 yr old male who presents today with chief complaint of chest congestion.   Reports that he did some yard work about 3 weeks ago (burning leaves) and then developed a sinus infection/nasal drainage.  Using a neti pot,reports that in the AM's and through the day. Now he feels  Reports that he has been coughing up thick white sputum. Notes that he feels some mild chest pressure/tightness but no shortness of breath.    Past Medical History:  Diagnosis Date  . CTS (carpal tunnel syndrome)    bilat (NCS/EMG - confirmed 06/12/18)  . Diastasis of rectus abdominis   . Erectile dysfunction    sildenafil prn helpful  . History of kidney stones    bladder  . Hyperlipidemia    Mild.  No meds in past records.  Recommended statin 05/2017 but pt wanted to try TLC first.  Lipids unchanged 09/2017 so on 09/09/17 he started atorva 20mg .  . IFG (impaired fasting glucose)    Per old records.  A1c 5.7% 05/2017.  A1c 5.3% 10/2017.  . Incisional hernia without obstruction or gangrene 05/2017  . Lumbar spondylosis    w/pars defect and listhesis (Dr. Saintclair Halsted).  Referred by neurosurg to pain mgmt MD and pt got ESI x 2 ---no help.  . Paresthesias    UEs and LE's 2019---recommended neuro referral and pt wanted to think about it.  Then, Dr. Posey Pronto evaluated him 05/2018 and ordered NCS/EMGs to further eval feet paresthesias.  . Prostate cancer [facility])    Locally  advanced prostate ca 06/2016.  Prostatectomy  RAL radical prostatectomy 07/2016.  PSA undectable as of 06/2018 urol f/u.  Marland Kitchen Pulmonary nodule 2009   Stable CT chest 2010--no further scans needed in pt at low risk for lung ca.  . 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     Social History   Socioeconomic History  . Marital status: Married    Spouse name: Rise Paganini  . Number of children: 3  . Years of education: Not on file  . Highest education level: Bachelor's degree (e.g., BA, AB, BS)  Occupational History  . Occupation: FIRE DEPARTMENT    Employer: [employer]    Comment: Retired  Tobacco Use  . Smoking status: Former Smoker    Quit date: 04/08/1982    Years since quitting: 37.0  . Smokeless tobacco: Never Used  Substance and Sexual Activity  . Alcohol use: Yes    Alcohol/week: 3.0 standard drinks    Types: 3 Cans of beer per week  . Drug use: No  . Sexual activity: Yes  Other Topics Concern  . Not on file  Social History Narrative   Married, 3 daughters.   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   No T/A/Ds.  Pt is right-handed. He lives with his wife and three daughters in a two level home. Prior to prostate cancer surgery in 2018, he exercised regularly. He walks now, weather permitting.   Social Determinants of Health   Financial Resource Strain:   . Difficulty of Paying Living Expenses: Not on file  Food Insecurity:   . Worried About Charity fundraiser in the Last Year: Not on file  . Ran Out of Food in the Last Year: Not on file  Transportation Needs:   . Lack of Transportation (Medical): Not on file  . Lack of Transportation (Non-Medical): Not on file  Physical Activity:   . Days of Exercise per Week: Not on file  . Minutes of Exercise per Session: Not on file  Stress:   .  Feeling of Stress : Not on file  Social Connections:   . Frequency of Communication with Friends and Family: Not on file  . Frequency of Social Gatherings with Friends and Family: Not on file  . Attends Religious Services: Not on file  . Active Member of Clubs or Organizations: Not on file  . Attends Archivist Meetings: Not on file  . Marital Status: Not on file  Intimate Partner Violence:   . Fear of Current or Ex-Partner: Not on file  . Emotionally Abused: Not on file  . Physically Abused: Not on file  . Sexually Abused: Not on file    Past Surgical History:  Procedure Laterality Date  . COLONOSCOPY  04/20/11   Normal: recall 10 yrs  . ett  approx 2010   normal  . KNEE ARTHROSCOPY  2000   left  . LYMPHADENECTOMY Bilateral 08/06/2016   Procedure: PELVIC LYMPHADENECTOMY;  Surgeon: Raynelle Bring, MD;  Location: [facility];  Service: Urology;  Laterality: Bilateral;  . NCV & EMG  06/02/2018; 06/14/2018   07/03/2018 (LEGS)->NORMAL.  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  Marland Kitchen REFRACTIVE SURGERY  2000   both eyes  . ROBOT ASSISTED LAPAROSCOPIC RADICAL PROSTATECTOMY N/A 08/06/2016   Procedure: Path: adenocarcinoma with extraprostatic extension present, negative margins, lymph nodes NEG. XI ROBOTIC ASSISTED LAPAROSCOPIC RADICAL PROSTATECTOMY LEVEL 2;  Surgeon: Raynelle Bring, MD;  Location: [facility];  Service: Urology;  Laterality: N/A;  . ROTATOR CUFF REPAIR  2002   right  . ROTATOR CUFF REPAIR  2003   left    Family History  Problem Relation Age of Onset  . Diabetes Father   . Cancer Sister        unknown  . Colon cancer Neg Hx   . Stomach cancer Neg Hx     Allergies  Allergen Reactions  . Atorvastatin Other (See Comments)    Myalgias/arthralgias    Current Outpatient Medications on File Prior to Visit  Medication Sig Dispense Refill  . calcium carbonate (TUMS - DOSED IN MG ELEMENTAL CALCIUM) 500 MG chewable tablet Chew 1-2 tablets by mouth  daily as needed for indigestion or heartburn.    . cetirizine (ZYRTEC) 10 MG tablet Take 10 mg by mouth daily as needed for allergies.    . IBUPROFEN PO Take by mouth. As needed    . Multiple Vitamin (MULTIVITAMIN) tablet Take 1 tablet by mouth daily.    . Omega-3 Fatty Acids (FISH OIL PO) Take 1 capsule by mouth daily.    . pravastatin (PRAVACHOL) 20 MG tablet Take 1 tablet (20 mg total) by mouth daily. 30 tablet 3   No current facility-administered medications on file prior to  visit.    There were no vitals taken for this visit.   Observations/Objective:   Gen: Awake, alert, no acute distress Resp: Breathing is even and non-labored Psych: calm/pleasant demeanor Neuro: Alert and Oriented x 3, + facial symmetry, speech is clear.   Assessment and Plan:  Bronchitis/sinusitis- will rx with doxycycline.  I advised the pt to go to pharmacy now and pick up albuterol and abx. Per hx, it sounds like he may be having some bronchospasm. He has previous hx of wheezing per chart review. He is to take albuterol when he picks it up. If no improvement in his chest tightness within 30 minutes of taking albuterol, he is advised to go to the ED for further Cardiac evaluation.  Pt verbalizes understanding. He is also advised to let us know if his bronchitis/sinusitis symptoms worsen or if they fail to improve.    Follow Up Instructions:    I discussed the assessment and treatment plan with the patient. The patient was provided an opportunity to ask questions and all were answered. The patient agreed with the plan and demonstrated an understanding of the instructions.   The patient was advised to call back or seek an in-person evaluation if the symptoms worsen or if the condition fails to improve as anticipated.  Nance Pear, NP

## 2019-05-01 NOTE — Telephone Encounter (Signed)
Okay'd by PCP verbally, okay to schedule as virtual. Please assist with scheduling, thanks.

## 2019-05-01 NOTE — Telephone Encounter (Signed)
Scheduled VOV at [facility]

## 2019-05-01 NOTE — Telephone Encounter (Signed)
Patient reporting that he thinks he may have a sinus infection. He has had some congestion 2-3 weeks, it has now gone to his chest. He reports his chest is hurting and coughing up green phlegm.  Please call if we can work in a virtual appt OR we try to get another virtual appt with another provider.  Patient can be reached at [phone].

## 2019-05-04 ENCOUNTER — Emergency Department (HOSPITAL_COMMUNITY)
Admission: EM | Admit: 2019-05-04 | Discharge: 2019-05-04 | Disposition: A | Discharge status: Home / Self Care | Payer: Managed Care, Other (non HMO) | Attending: Emergency Medicine | Admitting: Emergency Medicine

## 2019-05-04 ENCOUNTER — Encounter (HOSPITAL_COMMUNITY): Payer: Self-pay

## 2019-05-04 ENCOUNTER — Emergency Department (HOSPITAL_COMMUNITY): Payer: Managed Care, Other (non HMO)

## 2019-05-04 ENCOUNTER — Other Ambulatory Visit: Payer: Self-pay

## 2019-05-04 DIAGNOSIS — Z8546 Personal history of malignant neoplasm of prostate: Secondary | ICD-10-CM | POA: Insufficient documentation

## 2019-05-04 DIAGNOSIS — Z87891 Personal history of nicotine dependence: Secondary | ICD-10-CM | POA: Insufficient documentation

## 2019-05-04 DIAGNOSIS — R0789 Other chest pain: Secondary | ICD-10-CM | POA: Diagnosis present

## 2019-05-04 DIAGNOSIS — Z20828 Contact with and (suspected) exposure to other viral communicable diseases: Secondary | ICD-10-CM | POA: Insufficient documentation

## 2019-05-04 LAB — BASIC METABOLIC PANEL
Anion gap: 11 (ref 5–15)
BUN: 13 mg/dL (ref 8–23)
CO2: 20 mmol/L — ABNORMAL LOW (ref 22–32)
Calcium: 9.4 mg/dL (ref 8.9–10.3)
Chloride: 109 mmol/L (ref 98–111)
Creatinine, Ser: 0.95 mg/dL (ref 0.61–1.24)
GFR calc Af Amer: >60 mL/min (ref >60)
GFR calc non Af Amer: >60 mL/min (ref >60)
Glucose, Bld: 121 mg/dL — ABNORMAL HIGH (ref 70–99)
Potassium: 3.8 mmol/L (ref 3.5–5.1)
Sodium: 140 mmol/L (ref 135–145)

## 2019-05-04 LAB — CBC
HCT: 44.8 % (ref 39.0–52.0)
Hemoglobin: 15.4 g/dL (ref 13.0–17.0)
MCH: 31.4 pg (ref 26.0–34.0)
MCHC: 34.4 g/dL (ref 30.0–36.0)
MCV: 91.4 fL (ref 80.0–100.0)
Platelets: 285 10*3/uL (ref 150–400)
RBC: 4.9 MIL/uL (ref 4.22–5.81)
RDW: 13.2 % (ref 11.5–15.5)
WBC: 6.3 10*3/uL (ref 4.0–10.5)
nRBC: 0 % (ref 0.0–0.2)

## 2019-05-04 LAB — TROPONIN I (HIGH SENSITIVITY)
Troponin I (High Sensitivity): 3 ng/L (ref <18)
Troponin I (High Sensitivity): 3 ng/L (ref <18)

## 2019-05-04 MED ORDER — BENZONATATE 100 MG PO CAPS: 100.0000 mg | ORAL_CAPSULE | Freq: Three times a day (TID) | ORAL | 0 refills | Status: DC

## 2019-05-04 NOTE — ED Provider Notes (Signed)
  Physical Exam  BP 132/87   Pulse 82   Temp 97.7 F (36.5 C) (Oral)   Resp 13   SpO2 97%   Physical Exam Vitals and nursing note reviewed.  Constitutional:      Appearance: Normal appearance.  HENT:     Head: Normocephalic.  Eyes:     Conjunctiva/sclera: Conjunctivae normal.  Pulmonary:     Effort: Pulmonary effort is normal.  Chest:    Skin:    General: Skin is dry.  Neurological:     Mental Status: He is alert.  Psychiatric:        Mood and Affect: Mood normal.     ED Course/Procedures     Procedures  MDM  Care assumed from Krista Blue PA due to change of shift. Patient with hypercholesterolemia presenting with chest pain and a constellation of symptoms.  Reports 3 weeks of well localized chest pain in the left armpit and breast area.  Currently being treated for bronchitis with doxycycline and albuterol and reports a lot of harsh coughing.  Reports an episode today where he felt diaphoretic and dizzy around 1:30pm with worse sensation of chest pain. Also reported blurred vision but attributes this to needing glasses. Also reported mild diarrhea and nausea, no known covid exposure. Currently pain free. Labs reassuring. Waiting on second trop. If negative may be discharged home. Covid test also pending. I can reproduce muscular tenderness in the chest. Likely URI symptoms with chest wall pain. Advised patient he needs PMD follow up for further monitoring of symptoms and of his BP which was mildly elevated today.  Second trop negative. Referred to cardiology for outpatient workup. Patient chest pain free. Given rx for cough suppressant. Case discussed with Dr. Ronnald Nian and plana greed upon prior to d/c    Alveria Apley, PA-C 05/04/19 [place], [place], DO 05/04/19 2114

## 2019-05-04 NOTE — Discharge Instructions (Addendum)
You were seen today for chest pain and feeling unwell.  Your work-up was reassuring.  Your chest x-ray was clear.  I think you might have chest wall pain from straining from doing so much coughing.  However, given your family history and your history of elevated cholesterol you should follow-up with cardiology for further outpatient workup. I have referred you. Your blood pressure was mildly elevated today and this should also be followed up by your primary care doctor.  If you have another episode of increased or changed chest pain or you have shortness of breath, or feel like you are going to pass out please be reevaluated in the emergency department. We will avoid prednisone since it may increase your blood pressure further. I have sent a cough suppressant to the pharmacy for you.Thank you for allowing me to care for you today. Please return to the emergency department if you have new or worsening symptoms. Take your medications as instructed.

## 2019-05-04 NOTE — ED Notes (Signed)
Patient verbalizes understanding of discharge instructions. Opportunity for questioning and answers were provided. Armband removed by staff, pt discharged from ED to home via POV  

## 2019-05-04 NOTE — ED Provider Notes (Addendum)
[facility] EMERGENCY DEPARTMENT Provider Note   CSN: [id number] Arrival date & time: 05/04/19  1425     History Chief Complaint  Patient presents with  . Chest Pain    Rodney Watkins is a 63 y.o. male with PMH significant for prostate cancer status post prostatectomy who was recently diagnosed with a sinus infection/bronchitis and treated with doxycycline via virtual visit 05/01/2019 who presents to the ED for a 3-week history of chest pain.  Patient reports that his symptoms began after he was burning leaves in his yard without a mask and believes that he inhaled too much smoke.  Since then, he developed sinus symptoms and postnasal drip leading to chest congestion.  He complains of intermittent cough productive of mucus.  He states that his chest discomfort has been over the left side of his chest, but intermittently over the right side of chest, particularly with episodes of coughing and retching.  The PA who evaluated him virtually 3 days ago instructed him to come to the ED should his symptoms not improve with albuterol and doxycycline.  At approximately 1:30 PM today, he developed worsening chest pain while in the car listening to music with associated lightheadedness and "slurred speech".  He reports that he needed assistance from his wife to get back into the house which prompted him to come to the ED for evaluation.  He reports history of intermittent bilateral arm numbness symptoms which led him to be evaluated by neurology, EMG studies were all negative.  Denies any current headache or dizziness, difficulty breathing, leg swelling, history of clots or clotting disorder, abdominal pain, nausea or vomiting, urinary symptoms, or changes in bowel habits.  HPI     Past Medical History:  Diagnosis Date  . CTS (carpal tunnel syndrome)    bilat (NCS/EMG - confirmed 06/12/18)  . Diastasis of rectus abdominis   . Erectile dysfunction    sildenafil prn helpful  .  History of kidney stones    bladder  . Hyperlipidemia    Mild.  No meds in past records.  Recommended statin 05/2017 but pt wanted to try TLC first.  Lipids unchanged 09/2017 so on 09/09/17 he started atorva 20mg .  . IFG (impaired fasting glucose)    Per old records.  A1c 5.7% 05/2017.  A1c 5.3% 10/2017.  . Incisional hernia without obstruction or gangrene 05/2017  . Lumbar spondylosis    w/pars defect and listhesis (Dr. Saintclair Halsted).  Referred by neurosurg to pain mgmt MD and pt got ESI x 2 ---no help.  . Paresthesias    UEs and LE's 2019---recommended neuro referral and pt wanted to think about it.  Then, Dr. Posey Pronto evaluated him 05/2018 and ordered NCS/EMGs to further eval feet paresthesias.  . Prostate cancer [facility])    Locally advanced prostate ca 06/2016.  Prostatectomy  RAL radical prostatectomy 07/2016.  PSA undectable as of 06/2018 urol f/u.  Marland Kitchen Pulmonary nodule 2009   Stable CT chest 2010--no further scans needed in pt at low risk for lung ca.  . 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    Patient Active Problem List   Diagnosis Date Noted  . Overweight (BMI 25.0-29.9) 12/08/2018  . Pure hypercholesterolemia 05/27/2017  . Prostate cancer ([facility]) 08/06/2016  . POSTNASAL DRIP SYNDROME 04/24/2008  . PULMONARY NODULE 04/24/2008  . BASILAR LUNG ATELECTASIS 10/11/2007  . SHORTNESS OF BREATH (SOB) 10/11/2007  .  WHEEZING 10/11/2007  . COUGH 10/11/2007    Past Surgical History:  Procedure Laterality Date  . COLONOSCOPY  04/20/11   Normal: recall 10 yrs  . ett  approx 2010   normal  . KNEE ARTHROSCOPY  2000   left  . LYMPHADENECTOMY Bilateral 08/06/2016   Procedure: PELVIC LYMPHADENECTOMY;  Surgeon: Raynelle Bring, MD;  Location: [facility];  Service: Urology;  Laterality: Bilateral;  . NCV & EMG  06/02/2018; 06/14/2018   07/03/2018 (LEGS)->NORMAL.  06/14/2018: Arms->bilat median neuropathy at or below  wrist->CTS.  Marland Kitchen REFRACTIVE SURGERY  2000   both eyes  . ROBOT ASSISTED LAPAROSCOPIC RADICAL PROSTATECTOMY N/A 08/06/2016   Procedure: Path: adenocarcinoma with extraprostatic extension present, negative margins, lymph nodes NEG. XI ROBOTIC ASSISTED LAPAROSCOPIC RADICAL PROSTATECTOMY LEVEL 2;  Surgeon: Raynelle Bring, MD;  Location: [facility];  Service: Urology;  Laterality: N/A;  . ROTATOR CUFF REPAIR  2002   right  . ROTATOR CUFF REPAIR  2003   left       Family History  Problem Relation Age of Onset  . Diabetes Father   . Cancer Sister        unknown  . Colon cancer Neg Hx   . Stomach cancer Neg Hx     Social History   Tobacco Use  . Smoking status: Former Smoker    Quit date: 04/08/1982    Years since quitting: 37.0  . Smokeless tobacco: Never Used  Substance Use Topics  . Alcohol use: Yes    Alcohol/week: 3.0 standard drinks    Types: 3 Cans of beer per week  . Drug use: No    Home Medications Prior to Admission medications   Medication Sig Start Date End Date Taking? Authorizing Provider  albuterol (VENTOLIN HFA) 108 (90 Base) MCG/ACT inhaler Inhale 2 puffs into the lungs every 6 (six) hours as needed for wheezing or shortness of breath. 05/01/19   Debbrah Alar, NP  calcium carbonate (TUMS - DOSED IN MG ELEMENTAL CALCIUM) 500 MG chewable tablet Chew 1-2 tablets by mouth daily as needed for indigestion or heartburn.    [provider]  cetirizine (ZYRTEC) 10 MG tablet Take 10 mg by mouth daily as needed for allergies.    [provider]  doxycycline (VIBRA-TABS) 100 MG tablet Take 1 tablet (100 mg total) by mouth 2 (two) times daily. 05/01/19   Debbrah Alar, NP  IBUPROFEN PO Take by mouth. As needed    [provider]  Multiple Vitamin (MULTIVITAMIN) tablet Take 1 tablet by mouth daily.    [provider]  Omega-3 Fatty Acids (FISH OIL PO) Take 1 capsule by mouth daily.    [provider]  pravastatin (PRAVACHOL)  20 MG tablet Take 1 tablet (20 mg total) by mouth daily. 04/20/19   McGowen, Adrian [place], MD    Allergies    Atorvastatin  Review of Systems   Review of Systems  All other systems reviewed and are negative.   Physical Exam Updated Vital Signs BP (!) 169/106   Pulse 85   Temp 97.7 F (36.5 C) (Oral)   Resp 18   SpO2 95%   Physical Exam Vitals and nursing note reviewed. Exam conducted with a chaperone present.  Constitutional:      Appearance: Normal appearance.  HENT:     Head: Normocephalic and atraumatic.  Eyes:     General: No scleral icterus.    Conjunctiva/sclera: Conjunctivae normal.  Cardiovascular:     Rate and Rhythm: Normal rate and  regular rhythm.     Pulses: Normal pulses.     Heart sounds: Normal heart sounds.  Pulmonary:     Effort: Pulmonary effort is normal. No respiratory distress.     Breath sounds: Normal breath sounds. No wheezing or rales.     Comments: Breath sounds intact bilaterally. Chest wall tenderness reproducible on exam.  Abdominal:     General: Abdomen is flat. There is no distension.     Palpations: Abdomen is soft.     Tenderness: There is no abdominal tenderness. There is no guarding.  Musculoskeletal:     Cervical back: Normal range of motion and neck supple.     Right lower leg: No edema.     Left lower leg: No edema.  Skin:    General: Skin is dry.  Neurological:     Mental Status: He is alert.     GCS: GCS eye subscore is 4. GCS verbal subscore is 5. GCS motor subscore is 6.     Comments: CN II through XII grossly intact.  Negative Romberg and cerebellar exams.  Able to ambulate.  Sensation and pulses intact throughout.  Psychiatric:        Mood and Affect: Mood normal.        Behavior: Behavior normal.        Thought Content: Thought content normal.     ED Results / Procedures / Treatments   Labs (all labs ordered are listed, but only abnormal results are displayed) Labs Reviewed  BASIC METABOLIC PANEL - Abnormal;  Notable for the following components:      Result Value   CO2 20 (*)    Glucose, Bld 121 (*)    All other components within normal limits  NOVEL CORONAVIRUS, NAA (HOSP ORDER, SEND-OUT TO REF LAB; TAT 18-24 HRS)  CBC  TROPONIN I (HIGH SENSITIVITY)  TROPONIN I (HIGH SENSITIVITY)    EKG EKG Interpretation  Date/Time:  Thursday May 04 2019 14:27:38 EST Ventricular Rate:  87 PR Interval:  172 QRS Duration: 84 QT Interval:  348 QTC Calculation: 418 R Axis:   88 Text Interpretation: Normal sinus rhythm ST & T wave abnormality, consider inferior ischemia Abnormal ECG No significant change since last tracing Confirmed by Wandra Arthurs [phone]) on 05/04/2019 3:47:04 PM   Radiology DG Chest 2 View  Result Date: 05/04/2019 CLINICAL DATA:  Chest pain for 3 weeks, no improvement following albuterol and doxycycline, pressure behind heart, former smoker, history prostate cancer EXAM: CHEST - 2 VIEW COMPARISON:  12/02/2016 FINDINGS: Normal heart size, mediastinal contours, and pulmonary vascularity. Calcified granuloma RIGHT upper lobe. Lungs otherwise clear. No pulmonary infiltrate, pleural effusion, or pneumothorax. Osseous structures unremarkable. IMPRESSION: No acute abnormalities. Electronically Signed   By: Lavonia Dana M.D.   On: 05/04/2019 14:54    Procedures Procedures (including critical care time)  Medications Ordered in ED Medications - No data to display  ED Course  I have reviewed the triage vital signs and the nursing notes.  Pertinent labs & imaging results that were available during my care of the patient were reviewed by me and considered in my medical decision making (see chart for details).    MDM Rules/Calculators/A&P                      DG chest demonstrates no evidence of pneumothorax or consolidation concerning for pneumonia.  No acute cardiopulmonary abnormalities.  EKG demonstrates no significant changes from prior tracing.  BMP demonstrates no significant  electrolyte normalities and CBC is all within normal is.  Initial troponin is 30, but given acuity of his most recent episode of worsening chest pain with associated dizziness and nausea, will trend troponin. That said, given reproducible chest wall tenderness, suspect that his chest wall discomfort is musculoskeletal secondary to his frequent coughing and retching. He has not tried treating with ibuprofen or other NSAIDs.  Per Wells criteria for pulmonary embolism, very low risk.  He is neither tachycardic, tachypneic, or hypoxic.  Doubt pulmonary embolism.  At shift change care was transferred to [facility], PA-C who will follow pending studies, re-evaluate, and determine disposition.       Final Clinical Impression(s) / ED Diagnoses Final diagnoses:  Chest wall pain    Rx / DC Orders ED Discharge Orders    None       Corena Herter, PA-C 05/04/19 1638    Corena Herter, PA-C 05/04/19 1642    Drenda Freeze, MD 05/05/19 1242

## 2019-05-04 NOTE — ED Triage Notes (Signed)
Pt reports chest pain for 3 weeks, had a virtual visit Monday was given albuterol and doxycyline for sinus infection but states his pain is getting worse. Pressure behind his heart. States he had a near syncopal episode today. Pt a.o, nad noted

## 2019-05-05 LAB — NOVEL CORONAVIRUS, NAA (HOSP ORDER, SEND-OUT TO REF LAB; TAT 18-24 HRS): SARS-CoV-2, NAA: NOT DETECTED

## 2019-05-08 ENCOUNTER — Other Ambulatory Visit: Payer: Self-pay

## 2019-05-08 MED ORDER — PRAVASTATIN SODIUM 20 MG PO TABS: 20.0000 mg | ORAL_TABLET | Freq: Every day | ORAL | 0 refills | Status: DC

## 2019-05-12 DIAGNOSIS — M1990 Unspecified osteoarthritis, unspecified site: Secondary | ICD-10-CM

## 2019-05-12 DIAGNOSIS — R0789 Other chest pain: Secondary | ICD-10-CM

## 2019-05-12 HISTORY — DX: Unspecified osteoarthritis, unspecified site: M19.90

## 2019-05-12 HISTORY — DX: Other chest pain: R07.89

## 2019-05-16 ENCOUNTER — Other Ambulatory Visit: Payer: Self-pay

## 2019-05-16 ENCOUNTER — Encounter: Payer: Self-pay | Admitting: Family Medicine

## 2019-05-16 ENCOUNTER — Ambulatory Visit (INDEPENDENT_AMBULATORY_CARE_PROVIDER_SITE_OTHER): Payer: Managed Care, Other (non HMO) | Admitting: Family Medicine

## 2019-05-16 VITALS — BP 121/82 | HR 85 | Temp 98.3°F | Resp 16 | Ht 72.0 in | Wt 213.4 lb

## 2019-05-16 DIAGNOSIS — R0789 Other chest pain: Secondary | ICD-10-CM

## 2019-05-16 DIAGNOSIS — R7301 Impaired fasting glucose: Secondary | ICD-10-CM

## 2019-05-16 DIAGNOSIS — R059 Cough, unspecified: Secondary | ICD-10-CM

## 2019-05-16 DIAGNOSIS — F410 Panic disorder [episodic paroxysmal anxiety] without agoraphobia: Secondary | ICD-10-CM

## 2019-05-16 DIAGNOSIS — E785 Hyperlipidemia, unspecified: Secondary | ICD-10-CM | POA: Diagnosis not present

## 2019-05-16 DIAGNOSIS — R05 Cough: Secondary | ICD-10-CM

## 2019-05-16 DIAGNOSIS — K219 Gastro-esophageal reflux disease without esophagitis: Secondary | ICD-10-CM

## 2019-05-16 DIAGNOSIS — F4322 Adjustment disorder with anxiety: Secondary | ICD-10-CM

## 2019-05-16 MED ORDER — PANTOPRAZOLE SODIUM 40 MG PO TBEC: 40.0000 mg | DELAYED_RELEASE_TABLET | Freq: Every day | ORAL | 3 refills | Status: DC

## 2019-05-16 NOTE — Progress Notes (Signed)
OFFICE VISIT  05/16/2019   CC:  Chief Complaint  Patient presents with  . Chest pressure    x 3 weeks  . Dizziness    x3  weeks    HPI:    Patient is a 64 y.o. Caucasian male who presents for recent chest pressure/pain and episodic dizziness. Went to ED for same sx's 05/04/19->reviewed entire w/u today.  CXR normal, CBC and CMET normal, Troponins neg x 2.  EKG wnl, unchanged compared to 08/04/16. He was dx'd wih muscular/chest wall pain in the context of a viral URI w/cough. That day he said he had acute onset of feeling hot/flushed, left arm shaking uncontrollably, lightheaded but no vertigo or presyncope or palpitations. Not improved in 10 min so went to ED.    He had some brief similar episodes in the last couple weeks prior, unclear how frequent. About 3 wks prior he developed a URI with cough productive of thick sputum.  Discomfort in chest from lots of coughing, substernal region.  Then CP moved to L side of L pectoral muscle area. Not checking bp at home.  In ED his bp was elevated some but also normal some Saw [place] [facility] provider 05/01/19 and was rx'd doxy + albuterol.  GERD recently, persistent cough, jaws in TMJ region bilat hurting randomly.  Some on/off blurry vision. No focal weakness.  Has chronic episodes of paresthesias in upper and lower extremities.  Some HAs.   No recent dizziness other than the day he went to ED (non SINCE the visit).    Of note, he is very anxious b/c he has hx of prostate ca and his PSA has gradually been rising and he may end up having to get more treatment. Also anxious about FH and is afraid of "having the big one".   No prior hx of cardiac prob/eval in chart.  See HPI for ROS: plus Review of Systems  Constitutional: Positive for fatigue. Negative for appetite change, chills and fever.  HENT: Positive for sinus pain. Negative for congestion, dental problem, ear pain, sore throat and trouble swallowing.   Eyes: Positive for visual  disturbance (intermittent blurry vision. No double vision.). Negative for discharge and redness.  Respiratory: Positive for cough. Negative for chest tightness, shortness of breath and wheezing.   Cardiovascular: Positive for chest pain. Negative for palpitations and leg swelling.  Gastrointestinal: Negative for abdominal pain, blood in stool, diarrhea, nausea and vomiting.  Endocrine: Positive for polyphagia.  Genitourinary: Negative for difficulty urinating, dysuria, flank pain, frequency, hematuria and urgency.  Musculoskeletal: Negative for arthralgias, back pain, joint swelling, myalgias and neck stiffness.  Skin: Negative for pallor and rash.  Neurological: Negative for dizziness, seizures, syncope, speech difficulty, weakness, numbness and headaches.  Hematological: Negative for adenopathy. Does not bruise/bleed easily.  Psychiatric/Behavioral: Positive for sleep disturbance. Negative for confusion. The patient is nervous/anxious.      Past Medical History:  Diagnosis Date  . CTS (carpal tunnel syndrome)    bilat (NCS/EMG - confirmed 06/12/18)  . Diastasis of rectus abdominis   . Erectile dysfunction    sildenafil prn helpful  . History of kidney stones    bladder  . Hyperlipidemia    Mild.  No meds in past records.  Recommended statin 05/2017 but pt wanted to try TLC first.  Lipids unchanged 09/2017 so on 09/09/17 he started atorva 20mg .  . IFG (impaired fasting glucose)    Per old records.  A1c 5.7% 05/2017.  A1c 5.3% 10/2017.  . Incisional hernia  without obstruction or gangrene 05/2017  . Lumbar spondylosis    w/pars defect and listhesis (Dr. Saintclair Halsted).  Referred by neurosurg to pain mgmt MD and pt got ESI x 2 ---no help.  . Paresthesias    UEs and LE's 2019---recommended neuro referral and pt wanted to think about it.  Then, Dr. Posey Pronto evaluated him 05/2018 and ordered NCS/EMGs to further eval feet paresthesias.  . Prostate cancer [facility])    Locally advanced prostate ca 06/2016.   Prostatectomy  RAL radical prostatectomy 07/2016.  PSA undectable as of 06/2018 urol f/u.  Marland Kitchen Pulmonary nodule 2009   Stable CT chest 2010--no further scans needed in pt at low risk for lung ca.  . 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    Past Surgical History:  Procedure Laterality Date  . COLONOSCOPY  04/20/11   Normal: recall 10 yrs  . ett  approx 2010   normal  . KNEE ARTHROSCOPY  2000   left  . LYMPHADENECTOMY Bilateral 08/06/2016   Procedure: PELVIC LYMPHADENECTOMY;  Surgeon: Raynelle Bring, MD;  Location: [facility];  Service: Urology;  Laterality: Bilateral;  . NCV & EMG  06/02/2018; 06/14/2018   07/03/2018 (LEGS)->NORMAL.  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  Marland Kitchen REFRACTIVE SURGERY  2000   both eyes  . ROBOT ASSISTED LAPAROSCOPIC RADICAL PROSTATECTOMY N/A 08/06/2016   Procedure: Path: adenocarcinoma with extraprostatic extension present, negative margins, lymph nodes NEG. XI ROBOTIC ASSISTED LAPAROSCOPIC RADICAL PROSTATECTOMY LEVEL 2;  Surgeon: Raynelle Bring, MD;  Location: [facility];  Service: Urology;  Laterality: N/A;  . ROTATOR CUFF REPAIR  2002   right  . ROTATOR CUFF REPAIR  2003   left    Outpatient Medications Prior to Visit  Medication Sig Dispense Refill  . albuterol (VENTOLIN HFA) 108 (90 Base) MCG/ACT inhaler Inhale 2 puffs into the lungs every 6 (six) hours as needed for wheezing or shortness of breath. 6.7 g 0  . benzonatate (TESSALON) 100 MG capsule Take 1 capsule (100 mg total) by mouth every 8 (eight) hours. 21 capsule 0  . calcium carbonate (TUMS - DOSED IN MG ELEMENTAL CALCIUM) 500 MG chewable tablet Chew 1-2 tablets by mouth daily as needed for indigestion or heartburn.    . cetirizine (ZYRTEC) 10 MG tablet Take 10 mg by mouth daily as needed for allergies.    . IBUPROFEN PO Take by mouth. As needed    . Multiple Vitamin (MULTIVITAMIN) tablet  Take 1 tablet by mouth daily.    . pravastatin (PRAVACHOL) 20 MG tablet Take 1 tablet (20 mg total) by mouth daily. 90 tablet 0  . Turmeric (QC TUMERIC COMPLEX PO) Take by mouth daily.    Marland Kitchen doxycycline (VIBRA-TABS) 100 MG tablet Take 1 tablet (100 mg total) by mouth 2 (two) times daily. 14 tablet 0  . Omega-3 Fatty Acids (FISH OIL PO) Take 1 capsule by mouth daily.     No facility-administered medications prior to visit.    Allergies  Allergen Reactions  . Atorvastatin Other (See Comments)    Myalgias/arthralgias    ROS As per HPI  [id number] bp was 141/100 today.  Manual recheck 121/82 at end of visit. Blood pressure 121/82, pulse 85, temperature 98.3 F (36.8 C), temperature source Temporal, resp. rate 16, height 6' (1.829 m), weight 213 lb 6.4 oz (96.8 kg), SpO2 97 %. Body mass index is 28.94 kg/m.  Gen: Alert, well  appearing.  Patient is oriented to person, place, time, and situation. Very anxious, but lucid thought and speech. [id number]: no injection, icteris, swelling, or exudate.  EOMI, PERRLA. Mouth: lips without lesion/swelling.  Oral mucosa pink and moist. Oropharynx without erythema, exudate, or swelling.  Neck - No masses or thyromegaly or limitation in range of motion CV: RRR, no m/r/g.    Chest wall: TTP in lateral aspect of L pectoral mm, also in intercostal mm region adjacent to this. LUNGS: CTA bilat, nonlabored resps, good aeration in all lung fields. EXT: no clubbing or cyanosis.  no edema.  Neuro: CN 2-12 intact bilaterally, strength 5/5 in proximal and distal upper extremities and lower extremities bilaterally.    No tremor.    No ataxia.     LABS:    Chemistry      Component Value Date/Time   NA 140 05/04/2019 1435   NA 140 01/06/2017 1535   K 3.8 05/04/2019 1435   CL 109 05/04/2019 1435   CO2 20 (L) 05/04/2019 1435   BUN 13 05/04/2019 1435   BUN 13 01/06/2017 1535   CREATININE 0.95 05/04/2019 1435   CREATININE 1.07 12/13/2018 1001   GLU 120  01/06/2017 1535      Component Value Date/Time   CALCIUM 9.4 05/04/2019 1435   ALKPHOS 60 01/06/2017 1535   AST 18 12/13/2018 1001   ALT 19 12/13/2018 1001   BILITOT 0.7 12/13/2018 1001     Lab Results  Component Value Date   WBC 6.3 05/04/2019   HGB 15.4 05/04/2019   HCT 44.8 05/04/2019   MCV 91.4 05/04/2019   PLT 285 05/04/2019   Lab Results  Component Value Date   HGBA1C 5.5 12/13/2018   Lab Results  Component Value Date   CHOL 176 12/13/2018   HDL 53 12/13/2018   LDLCALC 108 (H) 12/13/2018   TRIG 61 12/13/2018   CHOLHDL 3.3 12/13/2018   Lab Results  Component Value Date   HGBA1C 5.5 12/13/2018    IMPRESSION AND PLAN:  1) Atypical CP, RF's are male sex, age, hyperlipidemia, prediabetes.  Moderate CV risk-->eval further with myocardial perfusion imaging with treadmill as stress.   BMET, A1c, FLP today. Of note, his EKG in the ED 05/05/19 was essentially normal, no change from several years prior.  I did not repeat EKG today->he was not having CP or SOB today and his cardiac exam and VS were normal.  2) GER/LPR: I believe this is the cause of his cough at this time. Start pantoprazole 40mg  qd. His URI with cough/bronchitis has resolved.  3) PANIC/ANXIETY- this is likely to be the crux of most of his physical issues. Discussed need to start SSRI soon.  Will discuss in more detail at f/u as long as his cardiac w/u is reassuring.  4) Elev bp w/out dx HTN->stress!.  5) Hx of prostate cancer.  He is s/p radical prostatectomy.  PSA surveillance at urologist suggestive of biochemical recurrence. This has pt very worried.  An After Visit Summary was printed and given to the patient.  FOLLOW UP: Return in about 2 weeks (around 05/30/2019) for f/u chest pain and anxiety/panic.  Signed:  Crissie Sickles, MD           05/16/2019

## 2019-05-17 LAB — BASIC METABOLIC PANEL
BUN: 12 mg/dL (ref 7–25)
CO2: 23 mmol/L (ref 20–32)
Calcium: 9.8 mg/dL (ref 8.6–10.3)
Chloride: 105 mmol/L (ref 98–110)
Creat: 0.95 mg/dL (ref 0.70–1.25)
Glucose, Bld: 108 mg/dL — ABNORMAL HIGH (ref 65–99)
Potassium: 4.3 mmol/L (ref 3.5–5.3)
Sodium: 137 mmol/L (ref 135–146)

## 2019-05-17 LAB — LIPID PANEL
Cholesterol: 206 mg/dL — ABNORMAL HIGH (ref <200)
HDL: 60 mg/dL (ref >=40)
LDL Cholesterol (Calc): 129 mg/dL (calc) — ABNORMAL HIGH
Non-HDL Cholesterol (Calc): 146 mg/dL (calc) — ABNORMAL HIGH (ref <130)
Total CHOL/HDL Ratio: 3.4 (calc) (ref <5.0)
Triglycerides: 80 mg/dL (ref <150)

## 2019-05-17 LAB — HEMOGLOBIN A1C
Hgb A1c MFr Bld: 5.6 % of total Hgb (ref <5.7)
Mean Plasma Glucose: 114 (calc)
eAG (mmol/L): 6.3 (calc)

## 2019-05-18 ENCOUNTER — Other Ambulatory Visit: Payer: Self-pay

## 2019-05-18 ENCOUNTER — Telehealth (HOSPITAL_COMMUNITY): Payer: Self-pay | Admitting: *Deleted

## 2019-05-18 DIAGNOSIS — E7800 Pure hypercholesterolemia, unspecified: Secondary | ICD-10-CM

## 2019-05-18 NOTE — Telephone Encounter (Signed)
Patient given detailed instructions per Myocardial Perfusion Study Information Sheet for the test on 05/25/19. Patient notified to arrive 15 minutes early and that it is imperative to arrive on time for appointment to keep from having the test rescheduled.  If you need to cancel or reschedule your appointment, please call the office within 24 hours of your appointment. . Patient verbalized understanding.Kirstie Peri

## 2019-05-24 ENCOUNTER — Encounter: Payer: Self-pay | Admitting: Family Medicine

## 2019-05-24 ENCOUNTER — Telehealth: Payer: Self-pay | Admitting: Family Medicine

## 2019-05-24 NOTE — Telephone Encounter (Signed)
Patient notified. He will monitor his symptoms for now, if anything changes then he will schedule appt or ask for cardiologist referral.

## 2019-05-24 NOTE — Telephone Encounter (Signed)
Noted  

## 2019-05-24 NOTE — Telephone Encounter (Signed)
Pls notify pt that his insurer denied coverage of the stress test I ordered (he is scheduled for this tomorrow).  They felt like it was not medically necessary based on the atypical chest symptoms he has had.  Next step is either referral to a cardiologist OR he can choose to just keep close observation of his symptoms for any changes that worry him and let me know if they occur and we can then reassess him.

## 2019-05-25 ENCOUNTER — Encounter (HOSPITAL_COMMUNITY): Payer: Managed Care, Other (non HMO)

## 2019-05-27 ENCOUNTER — Other Ambulatory Visit: Payer: Self-pay | Admitting: Family Medicine

## 2019-05-30 ENCOUNTER — Telehealth: Payer: Self-pay | Admitting: Family Medicine

## 2019-05-30 ENCOUNTER — Ambulatory Visit: Payer: Managed Care, Other (non HMO) | Admitting: Family Medicine

## 2019-05-30 ENCOUNTER — Other Ambulatory Visit: Payer: Self-pay

## 2019-05-30 MED ORDER — PRAVASTATIN SODIUM 20 MG PO TABS: 20.0000 mg | ORAL_TABLET | Freq: Every day | ORAL | 0 refills | Status: DC

## 2019-05-30 NOTE — Telephone Encounter (Signed)
Prior Auth needed for since 05/08/19  pravastatin (PRAVACHOL) 20 MG tablet [id number])  [place], [place]     Please call patient at [phone].

## 2019-05-30 NOTE — Telephone Encounter (Signed)
Patient contacted, new Rx needed. 90 d/s never received from 05/08/19.

## 2019-07-27 ENCOUNTER — Encounter: Payer: Self-pay | Admitting: *Deleted

## 2019-08-02 ENCOUNTER — Encounter: Payer: Self-pay | Admitting: Family Medicine

## 2019-08-09 ENCOUNTER — Telehealth: Payer: Self-pay

## 2019-08-09 ENCOUNTER — Other Ambulatory Visit: Payer: Self-pay

## 2019-08-09 MED ORDER — PRAVASTATIN SODIUM 20 MG PO TABS: 20.0000 mg | ORAL_TABLET | Freq: Every day | ORAL | 0 refills | Status: DC

## 2019-08-09 NOTE — Telephone Encounter (Signed)
Patient is out of town in [place]. He forgot his medicine.  pravastatin (PRAVACHOL) 20 MG tablet [id number]  Patient has not taken in 3 days, he will return on Monday.  Can he not take meds until his return OR can Dr. Anitra Lauth call in 6 d/s of med to local pharmacy where he is.  Pravastatin 6 d/s    [facility]  Please call patient at [phone]

## 2019-08-09 NOTE — Telephone Encounter (Signed)
6 day supply sent to [place]. Patient notified.

## 2019-08-14 ENCOUNTER — Encounter: Payer: Self-pay | Admitting: Radiation Oncology

## 2019-08-14 NOTE — Progress Notes (Signed)
GU Location of Tumor / Histology: Biochemical recurrent prostatic adenocarcinoma  If Prostate Cancer, Gleason Score is (3 + 4) and PSA is (3.22) pretreatment.   Rodney Watkins had a radical prostatectomy August 06, 2016. His margins were negative. Unfortunately, his PSA has begun to rise indicating biochemical reccurence.           Past/Anticipated interventions by urology, if any: prostate biopsy, prostatectomy, surveillance, referral to Dr. Tammi Klippel for consideration of salvage radiotherapy. Per note Dr. Alinda Money recommends six months of ADT. Patient denies having received ADT yet but confirms he is willing to.   Past/Anticipated interventions by medical oncology, if any: no  Weight changes, if any: no  Bowel/Bladder complaints, if [id number] 9. SHIM 24. Reports rare incontinence associated with certain movements. Reports intermittent erectile dysfuction. Denies dysuria or hematuria. Denies any bowel complaints.   Nausea/Vomiting, if any: no  Pain issues, if any:  Reports back pain. Reports Dr. Saintclair Halsted and Dr. Assunta Curtis have managed his back pain in the past.  SAFETY ISSUES:  Prior radiation? denies  Pacemaker/ICD? denies  Possible current pregnancy? no, male  Is the patient on methotrexate? denies  Current Complaints / other details:  64 year old male. Married with three children. 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   Reports he developed an umbilical hernia following prostatectomy. Patient explains this is getting worse. Patient request a referral to GI.

## 2019-08-15 ENCOUNTER — Telehealth: Payer: Self-pay | Admitting: *Deleted

## 2019-08-15 ENCOUNTER — Ambulatory Visit
Admission: RE | Admit: 2019-08-15 | Discharge: 2019-08-15 | Disposition: A | Discharge status: Home / Self Care | Payer: 59 | Source: Ambulatory Visit | Attending: Radiation Oncology | Admitting: Radiation Oncology

## 2019-08-15 ENCOUNTER — Encounter: Payer: Self-pay | Admitting: Radiation Oncology

## 2019-08-15 VITALS — Ht 72.0 in | Wt 205.0 lb

## 2019-08-15 DIAGNOSIS — C61 Malignant neoplasm of prostate: Secondary | ICD-10-CM

## 2019-08-15 NOTE — Telephone Encounter (Signed)
Called patient to inform of ADT appt. for 08-23-19 - arrival time- 8 am @ [facility] (Dr. Lynne Logan Office) and his sim has been moved to 08-25-19 - arrival time- 8:15 am @ Dr. Johny Shears Office, lvm for a return call

## 2019-08-15 NOTE — Progress Notes (Signed)
Radiation Oncology         [phone] ________________________________  Initial Outpatient Consultation - Conducted via Telephone due to current COVID-19 concerns for limiting patient exposure  Name: Rodney Watkins MRN: [id number]  Date: 08/15/2019  DOB: 06-Aug-1955  CC:McGowen, Adrian Blackwater, MD  Raynelle Bring, MD   REFERRING PHYSICIAN: Raynelle Bring, MD  DIAGNOSIS: 64 y.o. gentleman with rising, detectable PSA at 0.19 s/p RALP in 07/2016 for Stage pT3b, pN0 Gleason 3+4 prostate cancer    ICD-10-CM   1. Malignant neoplasm of prostate ([place])  C61   2. Prostate cancer [facility])  C61     HISTORY OF PRESENT ILLNESS: Rodney Watkins is a 64 y.o. male with a diagnosis of prostate cancer. He was initially diagnosed with Gleason 3+3 prostate cancer on 06/17/2016. PSA at that time was 3.22. He opted to proceed with RALP on 08/06/2016 under the care of Dr. Alinda Money. Final surgical pathology revealed Gleason 3+4 (with tertiary pattern 5) prostatic adenocarcinoma. Extraprostatic extension, perineural invasion, and bilateral seminal vesicle involvement were all present, however, the resection margins were negative, as well as all 5 biopsied lymph nodes (0/5).  His initial postsurgical PSA was very low but detectable at 0.02 in 11/2016. This has continued to slowly rise, most recently noted at 0.19 on 07/19/19 and 0.18 when repeated for confirmation on 07/26/19.  The patient reviewed the surgical pathology and PSA results with his urologist and he has kindly been referred today for discussion of potential salvage radiation treatment options.  Of note, following his prostatectomy he reports that he developed an umbilical hernia, which he feels is getting worse but remains easily reducible and pain-free at this time.   PREVIOUS RADIATION THERAPY: No  PAST MEDICAL HISTORY:  Past Medical History:  Diagnosis Date  . Atypical chest pain 05/2019   Insurance denied stress test I ordered.  Pt chose watchful  waiting approach at that time.  . CTS (carpal tunnel syndrome)    bilat (NCS/EMG - confirmed 06/12/18)  . Diastasis of rectus abdominis   . Erectile dysfunction    sildenafil prn helpful  . History of kidney stones    bladder  . Hyperlipidemia    Mild.  No meds in past records.  Recommended statin 05/2017 but pt wanted to try TLC first.  Lipids unchanged 09/2017 so on 09/09/17 he started atorva 20mg .  . IFG (impaired fasting glucose)    Per old records.  A1c 5.7% 05/2017.  A1c 5.3% 10/2017.  . Incisional hernia without obstruction or gangrene 05/2017  . Lumbar spondylosis    w/pars defect and listhesis (Dr. Saintclair Halsted).  Referred by neurosurg to pain mgmt MD and pt got ESI x 2 ---no help.  . Paresthesias    UEs and LE's 2019---recommended neuro referral and pt wanted to think about it.  Then, Dr. Posey Pronto evaluated him 05/2018 and ordered NCS/EMGs to further eval feet paresthesias.  . Prostate cancer [facility])    Locally advanced prostate ca 06/2016.  Prostatectomy  RAL radical prostatectomy 07/2016.  Slight PSA rise to 0.067 01/2019, then to 0.19 07/2019--biochemical recurrence suspected. Plan for ADT+RT planned.   . Pulmonary nodule 2009   Stable CT chest 2010--no further scans needed in pt at low risk for lung ca.  . 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      PAST SURGICAL HISTORY: Past Surgical History:  Procedure Laterality Date  .  COLONOSCOPY  04/20/11   Normal: recall 10 yrs  . ett  approx 2010   normal  . KNEE ARTHROSCOPY  2000   left  . LYMPHADENECTOMY Bilateral 08/06/2016   Procedure: PELVIC LYMPHADENECTOMY;  Surgeon: Raynelle Bring, MD;  Location: [facility];  Service: Urology;  Laterality: Bilateral;  . NCV & EMG  06/02/2018; 06/14/2018   07/03/2018 (LEGS)->NORMAL.  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  Marland Kitchen REFRACTIVE SURGERY  2000   both eyes  . ROBOT ASSISTED LAPAROSCOPIC  RADICAL PROSTATECTOMY N/A 08/06/2016   Procedure: Path: adenocarcinoma with extraprostatic extension present, negative margins, lymph nodes NEG. XI ROBOTIC ASSISTED LAPAROSCOPIC RADICAL PROSTATECTOMY LEVEL 2;  Surgeon: Raynelle Bring, MD;  Location: [facility];  Service: Urology;  Laterality: N/A;  . ROTATOR CUFF REPAIR  2002   right  . ROTATOR CUFF REPAIR  2003   left    FAMILY HISTORY:  Family History  Problem Relation Age of Onset  . Diabetes Father   . Ovarian cancer Sister   . Colon cancer Neg Hx   . Stomach cancer Neg Hx   . Prostate cancer Neg Hx   . Breast cancer Neg Hx     SOCIAL HISTORY:  Social History   Socioeconomic History  . Marital status: Married    Spouse name: Rise Paganini  . Number of children: 3  . Years of education: Not on file  . Highest education level: Bachelor's degree (e.g., BA, AB, BS)  Occupational History  . Occupation: FIRE DEPARTMENT    Employer: [employer]    Comment: Retired  Tobacco Use  . Smoking status: Never Smoker  . Smokeless tobacco: Never Used  Substance and Sexual Activity  . Alcohol use: Yes    Alcohol/week: 3.0 standard drinks    Types: 3 Cans of beer per week  . Drug use: No  . Sexual activity: Yes  Other Topics Concern  . Not on file  Social History Narrative   Married, 3 daughters.   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   No T/A/Ds.         Pt is right-handed. He lives with his wife and three daughters in a two level home. Prior to prostate cancer surgery in 2018, he exercised regularly. He walks now, weather permitting.   Social Determinants of Health   Financial Resource Strain:   . Difficulty of Paying Living Expenses:   Food Insecurity:   . Worried About Charity fundraiser in the Last Year:   . Arboriculturist in the Last Year:   Transportation Needs:   . Film/video editor (Medical):   Marland Kitchen Lack of Transportation (Non-Medical):     Physical Activity:   . Days of Exercise per Week:   . Minutes of Exercise per Session:   Stress:   . Feeling of Stress :   Social Connections:   . Frequency of Communication with Friends and Family:   . Frequency of Social Gatherings with Friends and Family:   . Attends Religious Services:   . Active Member of Clubs or Organizations:   . Attends Archivist Meetings:   Marland Kitchen Marital Status:   Intimate Partner Violence:   . Fear of Current or Ex-Partner:   . Emotionally Abused:   Marland Kitchen Physically Abused:   . Sexually Abused:     ALLERGIES: Atorvastatin  MEDICATIONS:  Current Outpatient Medications  Medication Sig Dispense  Refill  . cetirizine (ZYRTEC) 10 MG tablet Take 10 mg by mouth daily as needed for allergies.    . Multiple Vitamin (MULTIVITAMIN) tablet Take 1 tablet by mouth daily.    . pravastatin (PRAVACHOL) 20 MG tablet Take 1 tablet (20 mg total) by mouth daily. 6 tablet 0  . Turmeric (QC TUMERIC COMPLEX PO) Take by mouth daily.     No current facility-administered medications for this encounter.    REVIEW OF SYSTEMS:  On review of systems, the patient reports that he is doing well overall. He denies any chest pain, shortness of breath, cough, fevers, chills, night sweats, unintended weight changes. He denies any bowel disturbances, and denies abdominal pain, nausea or vomiting. He denies any new musculoskeletal or joint aches or pains. His IPSS was 9, indicating moderate urinary symptoms. He reports rare incontinence associated with certain movements. His SHIM was 24, indicating he does not have erectile dysfunction. However, he reports intermittent ED and only uses medications on rare occasion. A complete review of systems is obtained and is otherwise negative.  PHYSICAL EXAM:  Wt Readings from Last 3 Encounters:  08/15/19 205 lb (93 kg)  05/16/19 213 lb 6.4 oz (96.8 kg)  01/18/19 205 lb 6.4 oz (93.2 kg)   Temp Readings from Last 3 Encounters:  05/16/19 98.3 F  (36.8 C) (Temporal)  05/04/19 97.7 F (36.5 C) (Oral)  01/18/19 98.4 F (36.9 C) (Temporal)   BP Readings from Last 3 Encounters:  05/16/19 121/82  05/04/19 136/80  01/18/19 128/82   Pulse Readings from Last 3 Encounters:  05/16/19 85  05/04/19 85  01/18/19 82   Pain Assessment Pain Score: 2  Pain Frequency: Constant Pain Loc: Back(L5)/10  Physical exam not performed in light of telephone consult visit format.  KPS = 100  100 - Normal; no complaints; no evidence of disease. 90   - Able to carry on normal activity; minor signs or symptoms of disease. 80   - Normal activity with effort; some signs or symptoms of disease. 94   - Cares for self; unable to carry on normal activity or to do active work. 60   - Requires occasional assistance, but is able to care for most of his personal needs. 50   - Requires considerable assistance and frequent medical care. 81   - Disabled; requires special care and assistance. 42   - Severely disabled; hospital admission is indicated although death not imminent. 84   - Very sick; hospital admission necessary; active supportive treatment necessary. 10   - Moribund; fatal processes progressing rapidly. 0     - Dead  Karnofsky DA, Abelmann Vonore, Craver LS and Burchenal [facility] [phone]) The use of the nitrogen mustards in the palliative treatment of carcinoma: with particular reference to bronchogenic carcinoma Cancer 1 634-56  LABORATORY DATA:  Lab Results  Component Value Date   WBC 6.3 05/04/2019   HGB 15.4 05/04/2019   HCT 44.8 05/04/2019   MCV 91.4 05/04/2019   PLT 285 05/04/2019   Lab Results  Component Value Date   NA 137 05/16/2019   K 4.3 05/16/2019   CL 105 05/16/2019   CO2 23 05/16/2019   Lab Results  Component Value Date   ALT 19 12/13/2018   AST 18 12/13/2018   ALKPHOS 60 01/06/2017   BILITOT 0.7 12/13/2018     RADIOGRAPHY: No results found.    IMPRESSION/PLAN: This visit was conducted via Telephone to spare the patient  unnecessary potential exposure in the  healthcare setting during the current COVID-19 pandemic. 1. 64 y.o. gentleman with rising, detectable PSA at 0.19 s/p RALP in 07/2016 for Stage pT3b, pN0 Gleason 3+4 prostate cancer TodayweI reviewed the findings and workup thus far.  We discussed the natural history of prostate cancer.  We reviewed the the implications of positive margins, extracapsular extension, and seminal vesicle involvement, particularly in the setting of detectable, rising PSA, on the risk of prostate cancer recurrence. We reviewed some of the evidence suggesting an advantage for patients who undergo salvage radiotherapy in this setting in terms of disease control and overall survival.  We discussed radiation treatment directed to the prostatic fossa with regard to the logistics and delivery of external beam radiation treatment. We also detailed the role of ADT in the treatment of biochemically recurrent prostate cancer and outlined the associated side effects that could be expected with this therapy. The recommendation is to proceed with a 7.5 week course of daily radiotherapy to the prostate fossa, concurrent with ST-ADT.  He was encouraged to ask questions which were answered to his stated satisfaction.  At the end of the conversation, the patient is interested in moving forward with 7.5 weeks of external beam therapy concurrent with ST-ADT. He has not received his first Lupron injection. We will share our discussion with Dr. Alinda Money and make arrangements for start of ADT prior to simulation. He is tentatively scheduled for CT SIM/treatment planning at 8am on 09/01/19.  The patient appears to have a good understanding of his disease and our treatment recommendations which are of curative intent and is in agreement with the stated plan.  Therefore, we will move forward with treatment planning accordingly, in anticipation of beginning daily salvage prostate fossa treatments in the near future.  Given  current concerns for patient exposure during the COVID-19 pandemic, this encounter was conducted via telephone. The patient was notified in advance and was offered a MyChart meeting to allow for face to face communication but unfortunately reported that he did not have the appropriate resources/technology to support such a visit and instead preferred to proceed with telephone consult. The patient has given verbal consent for this type of encounter. The time spent during this encounter was 60 minutes. The attendants for this meeting include Tyler Pita MD, Ashlyn Bruning PA-C, [place], and patient Rodney Watkins. During the encounter, Tyler Pita MD, Ashlyn Bruning PA-C, and scribe, Wilburn Mylar were located at [place].  Patient Rodney Watkins was located at home.    Nicholos Johns, PA-C    Tyler Pita, MD  [facility] Direct Dial: [phone]  Fax: [phone] [email or web address]  Skype  LinkedIn  This document serves as a record of services personally performed by Tyler Pita, MD and Freeman Caldron, PA-C. It was created on their behalf by Wilburn Mylar, a trained medical scribe. The creation of this record is based on the scribe's personal observations and the provider's statements to them. This document has been checked and approved by the attending provider.

## 2019-08-15 NOTE — Progress Notes (Signed)
See progress note under physician encounter. 

## 2019-08-16 ENCOUNTER — Ambulatory Visit: Payer: Managed Care, Other (non HMO) | Admitting: Family Medicine

## 2019-08-24 ENCOUNTER — Telehealth: Payer: Self-pay | Admitting: *Deleted

## 2019-08-24 ENCOUNTER — Ambulatory Visit: Payer: Managed Care, Other (non HMO) | Admitting: Physician Assistant

## 2019-08-24 NOTE — Telephone Encounter (Signed)
CALLED PATIENT TO REMIND OF SIM FOR 08-25-19, PATIENT INFORMED ME THAT HIS HORMONE INJ. HAS NOT BEEN DONE YET DUE TO HIS INSURANCE, HORMONE INJ. WILL BE RESCHEDULED THEN HIS SIM WILL BE RESCHEDULED, NOTIFIED JEHNNA IN [facility]

## 2019-08-25 ENCOUNTER — Telehealth: Payer: Self-pay | Admitting: *Deleted

## 2019-08-25 ENCOUNTER — Ambulatory Visit: Payer: 59 | Admitting: Radiation Oncology

## 2019-08-25 NOTE — Telephone Encounter (Signed)
Returned patient's phone call, spoke with patient 

## 2019-08-30 ENCOUNTER — Ambulatory Visit: Payer: 59 | Admitting: Family Medicine

## 2019-08-31 ENCOUNTER — Telehealth: Payer: Self-pay | Admitting: *Deleted

## 2019-08-31 NOTE — Telephone Encounter (Signed)
CALLED PATIENT TO REMIND OF SIM APPT. FOR 09-01-19- ARRIVAL TIME- 9:45 AM @ [facility], SPOKE WITH PATIENT AND HE IS AWARE OF THIS APPT.

## 2019-09-01 ENCOUNTER — Other Ambulatory Visit: Payer: Self-pay

## 2019-09-01 ENCOUNTER — Encounter: Payer: Self-pay | Admitting: Medical Oncology

## 2019-09-01 ENCOUNTER — Other Ambulatory Visit: Payer: Self-pay | Admitting: Family Medicine

## 2019-09-01 ENCOUNTER — Encounter: Payer: Self-pay | Admitting: Urology

## 2019-09-01 ENCOUNTER — Ambulatory Visit: Payer: 59 | Admitting: Radiation Oncology

## 2019-09-01 ENCOUNTER — Ambulatory Visit
Admission: RE | Admit: 2019-09-01 | Discharge: 2019-09-01 | Disposition: A | Discharge status: Home / Self Care | Payer: 59 | Source: Ambulatory Visit | Attending: Radiation Oncology | Admitting: Radiation Oncology

## 2019-09-01 DIAGNOSIS — C61 Malignant neoplasm of prostate: Secondary | ICD-10-CM | POA: Diagnosis present

## 2019-09-01 NOTE — Progress Notes (Signed)
Met patient during CT simulation. I introduced myself as the prostate nurse navigator and discussed my role. He had a prostatectomy in 2018 and now with a rising PSA. He is very disappointed and hopes this radiation will work. He received his ADT 4/19.  I stressed that I am here for him and if I can do anything to make this journey better to please reach out to me. I will continue to follow.

## 2019-09-01 NOTE — Progress Notes (Signed)
  Radiation Oncology         [phone]) [phone] ________________________________  Name: Rodney Watkins MRN: [id number]  Date: 09/01/2019  DOB: 12/28/1955  SIMULATION AND TREATMENT PLANNING NOTE    ICD-10-CM   1. Prostate cancer [facility])  C61     DIAGNOSIS:  64 y.o. gentleman with rising, detectable PSA at 0.19 s/p RALP in 07/2016 for Stage pT3b, pN0 Gleason 3+4 prostate cancer  NARRATIVE:  The patient was brought to the [facility].  Identity was confirmed.  All relevant records and images related to the planned course of therapy were reviewed.  The patient freely provided informed written consent to proceed with treatment after reviewing the details related to the planned course of therapy. The consent form was witnessed and verified by the simulation staff.  Then, the patient was set-up in a stable reproducible supine position for radiation therapy.  A vacuum lock pillow device was custom fabricated to position his legs in a reproducible immobilized position.  Then, I performed a urethrogram under sterile conditions to identify the prostatic bed.  CT images were obtained.  Surface markings were placed.  The CT images were loaded into the planning software.  Then the prostate bed target, pelvic lymph node target and avoidance structures including the rectum, bladder, bowel and hips were contoured.  Treatment planning then occurred.  The radiation prescription was entered and confirmed.  A total of one complex treatment devices were fabricated. I have requested : Intensity Modulated Radiotherapy (IMRT) is medically necessary for this case for the following reason:  Rectal sparing.Marland Kitchen  PLAN:  The patient will receive 45 Gy in 25 fractions of 1.8 Gy, followed by a boost to the prostate bed to a total dose of 68.4 Gy with 13 additional fractions of 1.8 Gy.   ________________________________  Sheral Apley Tammi Klippel, M.D.

## 2019-09-01 NOTE — Progress Notes (Signed)
Patient received 6 month Eligard injection with Dr. Alinda Money 08/28/19.  Nicholos Johns, MMS, PA-C [place] at [place]: [phone]  Fax: [phone]

## 2019-09-04 ENCOUNTER — Telehealth: Payer: Self-pay

## 2019-09-04 NOTE — Telephone Encounter (Signed)
Patient returned call and advised refill sent.

## 2019-09-04 NOTE — Telephone Encounter (Signed)
Refill sent on 4/23 [facility] to [facility]. Tried contacting patient, left message to return.

## 2019-09-04 NOTE — Telephone Encounter (Signed)
Patient called in requesting a refill on his medication    pravastatin (PRAVACHOL) 20 MG tablet     [facility] [place], [place]

## 2019-09-07 ENCOUNTER — Ambulatory Visit: Payer: 59

## 2019-09-07 DIAGNOSIS — C61 Malignant neoplasm of prostate: Secondary | ICD-10-CM | POA: Diagnosis not present

## 2019-09-08 ENCOUNTER — Ambulatory Visit: Payer: 59

## 2019-09-11 ENCOUNTER — Ambulatory Visit: Payer: 59

## 2019-09-12 ENCOUNTER — Ambulatory Visit: Payer: 59

## 2019-09-12 ENCOUNTER — Other Ambulatory Visit: Payer: Self-pay

## 2019-09-12 ENCOUNTER — Encounter: Payer: Self-pay | Admitting: Medical Oncology

## 2019-09-12 ENCOUNTER — Ambulatory Visit
Admission: RE | Admit: 2019-09-12 | Discharge: 2019-09-12 | Disposition: A | Discharge status: Home / Self Care | Payer: 59 | Source: Ambulatory Visit | Attending: Radiation Oncology | Admitting: Radiation Oncology

## 2019-09-12 DIAGNOSIS — C61 Malignant neoplasm of prostate: Secondary | ICD-10-CM | POA: Diagnosis not present

## 2019-09-12 NOTE — Progress Notes (Signed)
Today for first radiation treatment. We discussed the importance of having a comfortably full bladder to decrease side effects. He voiced understanding.

## 2019-09-13 ENCOUNTER — Other Ambulatory Visit: Payer: Self-pay

## 2019-09-13 ENCOUNTER — Ambulatory Visit
Admission: RE | Admit: 2019-09-13 | Discharge: 2019-09-13 | Disposition: A | Discharge status: Home / Self Care | Payer: 59 | Source: Ambulatory Visit | Attending: Radiation Oncology | Admitting: Radiation Oncology

## 2019-09-13 ENCOUNTER — Ambulatory Visit: Payer: 59

## 2019-09-13 DIAGNOSIS — C61 Malignant neoplasm of prostate: Secondary | ICD-10-CM | POA: Diagnosis not present

## 2019-09-14 ENCOUNTER — Ambulatory Visit
Admission: RE | Admit: 2019-09-14 | Discharge: 2019-09-14 | Disposition: A | Discharge status: Home / Self Care | Payer: 59 | Source: Ambulatory Visit | Attending: Radiation Oncology | Admitting: Radiation Oncology

## 2019-09-14 ENCOUNTER — Ambulatory Visit: Payer: 59

## 2019-09-14 ENCOUNTER — Other Ambulatory Visit: Payer: Self-pay

## 2019-09-14 DIAGNOSIS — C61 Malignant neoplasm of prostate: Secondary | ICD-10-CM | POA: Diagnosis not present

## 2019-09-15 ENCOUNTER — Other Ambulatory Visit: Payer: Self-pay

## 2019-09-15 ENCOUNTER — Ambulatory Visit: Payer: 59

## 2019-09-15 ENCOUNTER — Ambulatory Visit
Admission: RE | Admit: 2019-09-15 | Discharge: 2019-09-15 | Disposition: A | Discharge status: Home / Self Care | Payer: 59 | Source: Ambulatory Visit | Attending: Radiation Oncology | Admitting: Radiation Oncology

## 2019-09-15 DIAGNOSIS — C61 Malignant neoplasm of prostate: Secondary | ICD-10-CM

## 2019-09-15 DIAGNOSIS — R059 Cough, unspecified: Secondary | ICD-10-CM

## 2019-09-15 DIAGNOSIS — R05 Cough: Secondary | ICD-10-CM

## 2019-09-15 LAB — URINALYSIS, COMPLETE (UACMP) WITH MICROSCOPIC
Bilirubin Urine: NEGATIVE
Glucose, UA: NEGATIVE mg/dL
Hgb urine dipstick: NEGATIVE
Ketones, ur: NEGATIVE mg/dL
Leukocytes,Ua: NEGATIVE
Nitrite: NEGATIVE
Protein, ur: NEGATIVE mg/dL
Specific Gravity, Urine: 1.027 (ref 1.005–1.030)
pH: 5 (ref 5.0–8.0)

## 2019-09-16 LAB — URINE CULTURE: Culture: NO GROWTH

## 2019-09-18 ENCOUNTER — Ambulatory Visit
Admission: RE | Admit: 2019-09-18 | Discharge: 2019-09-18 | Disposition: A | Discharge status: Home / Self Care | Payer: 59 | Source: Ambulatory Visit | Attending: Radiation Oncology | Admitting: Radiation Oncology

## 2019-09-18 ENCOUNTER — Ambulatory Visit: Payer: 59

## 2019-09-18 ENCOUNTER — Other Ambulatory Visit: Payer: Self-pay

## 2019-09-18 DIAGNOSIS — C61 Malignant neoplasm of prostate: Secondary | ICD-10-CM | POA: Diagnosis not present

## 2019-09-19 ENCOUNTER — Other Ambulatory Visit: Payer: Self-pay

## 2019-09-19 ENCOUNTER — Ambulatory Visit: Payer: 59

## 2019-09-19 ENCOUNTER — Ambulatory Visit
Admission: RE | Admit: 2019-09-19 | Discharge: 2019-09-19 | Disposition: A | Discharge status: Home / Self Care | Payer: 59 | Source: Ambulatory Visit | Attending: Radiation Oncology | Admitting: Radiation Oncology

## 2019-09-19 DIAGNOSIS — C61 Malignant neoplasm of prostate: Secondary | ICD-10-CM | POA: Diagnosis not present

## 2019-09-20 ENCOUNTER — Ambulatory Visit
Admission: RE | Admit: 2019-09-20 | Discharge: 2019-09-20 | Disposition: A | Discharge status: Home / Self Care | Payer: 59 | Source: Ambulatory Visit | Attending: Radiation Oncology | Admitting: Radiation Oncology

## 2019-09-20 ENCOUNTER — Other Ambulatory Visit: Payer: Self-pay

## 2019-09-20 ENCOUNTER — Ambulatory Visit: Payer: 59

## 2019-09-20 DIAGNOSIS — C61 Malignant neoplasm of prostate: Secondary | ICD-10-CM | POA: Diagnosis not present

## 2019-09-21 ENCOUNTER — Ambulatory Visit: Payer: 59

## 2019-09-21 ENCOUNTER — Ambulatory Visit
Admission: RE | Admit: 2019-09-21 | Discharge: 2019-09-21 | Disposition: A | Discharge status: Home / Self Care | Payer: 59 | Source: Ambulatory Visit | Attending: Radiation Oncology | Admitting: Radiation Oncology

## 2019-09-21 ENCOUNTER — Other Ambulatory Visit: Payer: Self-pay

## 2019-09-21 DIAGNOSIS — C61 Malignant neoplasm of prostate: Secondary | ICD-10-CM | POA: Diagnosis not present

## 2019-09-22 ENCOUNTER — Ambulatory Visit: Payer: 59

## 2019-09-22 ENCOUNTER — Ambulatory Visit
Admission: RE | Admit: 2019-09-22 | Discharge: 2019-09-22 | Disposition: A | Discharge status: Home / Self Care | Payer: 59 | Source: Ambulatory Visit | Attending: Radiation Oncology | Admitting: Radiation Oncology

## 2019-09-22 ENCOUNTER — Other Ambulatory Visit: Payer: Self-pay

## 2019-09-22 DIAGNOSIS — C61 Malignant neoplasm of prostate: Secondary | ICD-10-CM | POA: Diagnosis not present

## 2019-09-25 ENCOUNTER — Ambulatory Visit: Payer: 59

## 2019-09-25 ENCOUNTER — Ambulatory Visit
Admission: RE | Admit: 2019-09-25 | Discharge: 2019-09-25 | Disposition: A | Discharge status: Home / Self Care | Payer: 59 | Source: Ambulatory Visit | Attending: Radiation Oncology | Admitting: Radiation Oncology

## 2019-09-25 ENCOUNTER — Other Ambulatory Visit: Payer: Self-pay

## 2019-09-25 DIAGNOSIS — C61 Malignant neoplasm of prostate: Secondary | ICD-10-CM | POA: Diagnosis not present

## 2019-09-26 ENCOUNTER — Other Ambulatory Visit: Payer: Self-pay

## 2019-09-26 ENCOUNTER — Ambulatory Visit: Payer: 59

## 2019-09-26 ENCOUNTER — Ambulatory Visit
Admission: RE | Admit: 2019-09-26 | Discharge: 2019-09-26 | Disposition: A | Discharge status: Home / Self Care | Payer: 59 | Source: Ambulatory Visit | Attending: Radiation Oncology | Admitting: Radiation Oncology

## 2019-09-26 DIAGNOSIS — C61 Malignant neoplasm of prostate: Secondary | ICD-10-CM | POA: Diagnosis not present

## 2019-09-27 ENCOUNTER — Ambulatory Visit: Payer: 59

## 2019-09-27 ENCOUNTER — Ambulatory Visit
Admission: RE | Admit: 2019-09-27 | Discharge: 2019-09-27 | Disposition: A | Discharge status: Home / Self Care | Payer: 59 | Source: Ambulatory Visit | Attending: Radiation Oncology | Admitting: Radiation Oncology

## 2019-09-27 ENCOUNTER — Other Ambulatory Visit: Payer: Self-pay

## 2019-09-27 DIAGNOSIS — C61 Malignant neoplasm of prostate: Secondary | ICD-10-CM | POA: Diagnosis not present

## 2019-09-28 ENCOUNTER — Ambulatory Visit
Admission: RE | Admit: 2019-09-28 | Discharge: 2019-09-28 | Disposition: A | Discharge status: Home / Self Care | Payer: 59 | Source: Ambulatory Visit | Attending: Radiation Oncology | Admitting: Radiation Oncology

## 2019-09-28 ENCOUNTER — Other Ambulatory Visit: Payer: Self-pay

## 2019-09-28 ENCOUNTER — Ambulatory Visit: Payer: 59

## 2019-09-28 DIAGNOSIS — C61 Malignant neoplasm of prostate: Secondary | ICD-10-CM | POA: Diagnosis not present

## 2019-09-29 ENCOUNTER — Ambulatory Visit
Admission: RE | Admit: 2019-09-29 | Discharge: 2019-09-29 | Disposition: A | Discharge status: Home / Self Care | Payer: 59 | Source: Ambulatory Visit | Attending: Radiation Oncology | Admitting: Radiation Oncology

## 2019-09-29 ENCOUNTER — Other Ambulatory Visit: Payer: Self-pay

## 2019-09-29 ENCOUNTER — Ambulatory Visit: Payer: 59

## 2019-09-29 DIAGNOSIS — C61 Malignant neoplasm of prostate: Secondary | ICD-10-CM | POA: Diagnosis not present

## 2019-10-02 ENCOUNTER — Ambulatory Visit: Payer: 59

## 2019-10-02 ENCOUNTER — Ambulatory Visit
Admission: RE | Admit: 2019-10-02 | Discharge: 2019-10-02 | Disposition: A | Discharge status: Home / Self Care | Payer: 59 | Source: Ambulatory Visit | Attending: Radiation Oncology | Admitting: Radiation Oncology

## 2019-10-02 ENCOUNTER — Other Ambulatory Visit: Payer: Self-pay

## 2019-10-02 DIAGNOSIS — C61 Malignant neoplasm of prostate: Secondary | ICD-10-CM | POA: Diagnosis not present

## 2019-10-03 ENCOUNTER — Other Ambulatory Visit: Payer: Self-pay

## 2019-10-03 ENCOUNTER — Ambulatory Visit
Admission: RE | Admit: 2019-10-03 | Discharge: 2019-10-03 | Disposition: A | Discharge status: Home / Self Care | Payer: 59 | Source: Ambulatory Visit | Attending: Radiation Oncology | Admitting: Radiation Oncology

## 2019-10-03 ENCOUNTER — Ambulatory Visit: Payer: 59

## 2019-10-03 DIAGNOSIS — C61 Malignant neoplasm of prostate: Secondary | ICD-10-CM | POA: Diagnosis not present

## 2019-10-04 ENCOUNTER — Encounter: Payer: Self-pay | Admitting: Physician Assistant

## 2019-10-04 ENCOUNTER — Ambulatory Visit: Payer: 59

## 2019-10-04 ENCOUNTER — Telehealth: Payer: Self-pay | Admitting: Radiation Oncology

## 2019-10-04 ENCOUNTER — Ambulatory Visit (INDEPENDENT_AMBULATORY_CARE_PROVIDER_SITE_OTHER): Payer: 59 | Admitting: Physician Assistant

## 2019-10-04 ENCOUNTER — Other Ambulatory Visit: Payer: Self-pay

## 2019-10-04 ENCOUNTER — Ambulatory Visit
Admission: RE | Admit: 2019-10-04 | Discharge: 2019-10-04 | Disposition: A | Discharge status: Home / Self Care | Payer: 59 | Source: Ambulatory Visit | Attending: Radiation Oncology | Admitting: Radiation Oncology

## 2019-10-04 DIAGNOSIS — D485 Neoplasm of uncertain behavior of skin: Secondary | ICD-10-CM

## 2019-10-04 DIAGNOSIS — Z87898 Personal history of other specified conditions: Secondary | ICD-10-CM

## 2019-10-04 DIAGNOSIS — L820 Inflamed seborrheic keratosis: Secondary | ICD-10-CM | POA: Diagnosis not present

## 2019-10-04 DIAGNOSIS — Z85828 Personal history of other malignant neoplasm of skin: Secondary | ICD-10-CM | POA: Diagnosis not present

## 2019-10-04 DIAGNOSIS — Z86018 Personal history of other benign neoplasm: Secondary | ICD-10-CM

## 2019-10-04 DIAGNOSIS — L918 Other hypertrophic disorders of the skin: Secondary | ICD-10-CM | POA: Diagnosis not present

## 2019-10-04 DIAGNOSIS — C61 Malignant neoplasm of prostate: Secondary | ICD-10-CM | POA: Diagnosis not present

## 2019-10-04 NOTE — Telephone Encounter (Signed)
-----   Message from Freeman Caldron, [place] sent at 10/03/2019  5:03 PM EDT ----- Regarding: RE: Bone density test That would be at the discretion of his urologist, Dr. Alinda Money.  They typically only do these in guys that are going to be on long term ADT and as far as I can tell, this guy will only be getting 6 months of therapy. But, he can always put a call in to Dr. Lynne Logan nurse to confirm their recommendation. Ailene Ards ----- Message ----- From: Heywood Footman, RN Sent: 10/03/2019  12:58 PM EDT To: Tyler Pita, MD, Freeman Caldron, PA-C Subject: Bone density test                              Dr. Tammi Klippel.   Mr. Volo has seen Dr. Lisbeth Renshaw for his last two PUTS. Dr. Lisbeth Renshaw defers him to you when he ask about needing a bone density test. 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 He explained that his friends have all had bone density test done or are supplementing with Vitamin C & D. He questions if he should do one or the other. I told him I would question you and get back with him.  Sam

## 2019-10-04 NOTE — Telephone Encounter (Signed)
-----   Message from Freeman Caldron, [place] sent at 10/03/2019  5:03 PM EDT ----- Regarding: RE: Bone density test That would be at the discretion of his urologist, Dr. Alinda Money.  They typically only do these in guys that are going to be on long term ADT and as far as I can tell, this guy will only be getting 6 months of therapy. But, he can always put a call in to Dr. Lynne Logan nurse to confirm their recommendation. Ailene Ards ----- Message ----- From: Heywood Footman, RN Sent: 10/03/2019  12:58 PM EDT To: Tyler Pita, MD, Freeman Caldron, PA-C Subject: Bone density test                              Dr. Tammi Klippel.   Mr. Weers has seen Dr. Lisbeth Renshaw for his last two PUTS. Dr. Lisbeth Renshaw defers him to you when he ask about needing a bone density test. 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 He explained that his friends have all had bone density test done or are supplementing with Vitamin C & D. He questions if he should do one or the other. I told him I would question you and get back with him.  Sam

## 2019-10-04 NOTE — Patient Instructions (Signed)

## 2019-10-04 NOTE — Progress Notes (Signed)
   Follow up Visit  Rodney Watkins is a 64 y.o. male who presents for the following: Skin Tag (under arrms and left eyelid. wants removed. check spots on chest x couple of months itchy at times). Skin tags under left eye and both underarms. Get rubbed by clothes and get sore. Spot on chest came up a couple months ago and is growing and seems to look like a wart.   Objective  Well appearing patient in no apparent distress; mood and affect are within normal limits.  Face, chest, back, arms and lower legs examined. Relevant physical exam findings are noted in the Assessment and Plan. No suspicious moles noted on back.   Objective  Right Lower Back: History of 8 atypical nevi since 2009  Objective  Left Lower side of leg: Erythematous stuck-on, waxy papule or plaque.   Objective  Left chest: 6.5 to nipple Warty papule     Objective  Left Axilla (10), Right Axilla (8): Fleshy, skin-colored sessile and pedunculated papules.    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 - Posterior  2013  History of atypical nevus Right Lower Back  Inflamed seborrheic keratosis Left Lower side of leg  Destruction of lesion - Left Lower side of leg Complexity: simple   Destruction method: cryotherapy   Informed consent: discussed and consent obtained   Timeout:  patient name, date of birth, surgical site, and procedure verified Lesion destroyed using liquid nitrogen: Yes   Outcome: patient tolerated procedure well with no complications    Neoplasm of uncertain behavior of skin Left chest  Skin / nail biopsy Type of biopsy: tangential   Informed consent: discussed and consent obtained   Timeout: patient name, date of birth, surgical site, and procedure verified   Procedure prep:  Patient was prepped and draped in usual sterile fashion (Non sterile) Prep type:  Chlorhexidine Anesthesia: the lesion was anesthetized in a  standard fashion   Anesthetic:  1% lidocaine w/ epinephrine 1-100,000 local infiltration Instrument used: flexible razor blade   Outcome: patient tolerated procedure well   Post-procedure details: wound care instructions given    Specimen 1 - Surgical pathology Differential Diagnosis: scc vs bcc Check Margins: No  Skin tag (18) Left Axilla (10); Right Axilla (8)  Destruction of lesion - Right Axilla Complexity: simple   Destruction method: cryotherapy   Informed consent: discussed and consent obtained   Timeout:  patient name, date of birth, surgical site, and procedure verified Lesion destroyed using liquid nitrogen: Yes   Outcome: patient tolerated procedure well with no complications   Additional details:  No charge

## 2019-10-04 NOTE — Telephone Encounter (Signed)
Opened in error

## 2019-10-04 NOTE — Telephone Encounter (Signed)
Phoned patient to follow up reference his question about need for bone density testing and supplementing with Vitamin C and D. Explained this would be at the discretion of his urologist, Dr. Alinda Money. Explained that typically only guys that are going to be on long term ADT require this to be done. Verbalized my understanding he is getting the one six month injection. Encouraged him to put a call into Dr. Lynne Logan nurse. Patient verbalized understanding of all reviewed and appreciation for the follow up.

## 2019-10-05 ENCOUNTER — Ambulatory Visit: Payer: 59 | Admitting: Family Medicine

## 2019-10-05 ENCOUNTER — Other Ambulatory Visit: Payer: Self-pay

## 2019-10-05 ENCOUNTER — Ambulatory Visit
Admission: RE | Admit: 2019-10-05 | Discharge: 2019-10-05 | Disposition: A | Discharge status: Home / Self Care | Payer: 59 | Source: Ambulatory Visit | Attending: Radiation Oncology | Admitting: Radiation Oncology

## 2019-10-05 ENCOUNTER — Ambulatory Visit: Payer: 59

## 2019-10-05 DIAGNOSIS — C61 Malignant neoplasm of prostate: Secondary | ICD-10-CM | POA: Diagnosis not present

## 2019-10-06 ENCOUNTER — Ambulatory Visit
Admission: RE | Admit: 2019-10-06 | Discharge: 2019-10-06 | Disposition: A | Discharge status: Home / Self Care | Payer: 59 | Source: Ambulatory Visit | Attending: Radiation Oncology | Admitting: Radiation Oncology

## 2019-10-06 ENCOUNTER — Ambulatory Visit: Payer: 59

## 2019-10-06 ENCOUNTER — Other Ambulatory Visit: Payer: Self-pay

## 2019-10-06 DIAGNOSIS — C61 Malignant neoplasm of prostate: Secondary | ICD-10-CM | POA: Diagnosis not present

## 2019-10-09 ENCOUNTER — Ambulatory Visit: Payer: 59

## 2019-10-10 ENCOUNTER — Ambulatory Visit: Payer: 59

## 2019-10-10 ENCOUNTER — Ambulatory Visit
Admission: RE | Admit: 2019-10-10 | Discharge: 2019-10-10 | Disposition: A | Discharge status: Home / Self Care | Payer: 59 | Source: Ambulatory Visit | Attending: Radiation Oncology | Admitting: Radiation Oncology

## 2019-10-10 ENCOUNTER — Other Ambulatory Visit: Payer: Self-pay

## 2019-10-10 DIAGNOSIS — C61 Malignant neoplasm of prostate: Secondary | ICD-10-CM | POA: Insufficient documentation

## 2019-10-11 ENCOUNTER — Ambulatory Visit
Admission: RE | Admit: 2019-10-11 | Discharge: 2019-10-11 | Disposition: A | Discharge status: Home / Self Care | Payer: 59 | Source: Ambulatory Visit | Attending: Radiation Oncology | Admitting: Radiation Oncology

## 2019-10-11 ENCOUNTER — Ambulatory Visit: Payer: 59

## 2019-10-11 ENCOUNTER — Other Ambulatory Visit: Payer: Self-pay

## 2019-10-11 DIAGNOSIS — C61 Malignant neoplasm of prostate: Secondary | ICD-10-CM | POA: Diagnosis not present

## 2019-10-12 ENCOUNTER — Ambulatory Visit
Admission: RE | Admit: 2019-10-12 | Discharge: 2019-10-12 | Disposition: A | Discharge status: Home / Self Care | Payer: 59 | Source: Ambulatory Visit | Attending: Radiation Oncology | Admitting: Radiation Oncology

## 2019-10-12 ENCOUNTER — Ambulatory Visit: Payer: 59

## 2019-10-12 ENCOUNTER — Other Ambulatory Visit: Payer: Self-pay

## 2019-10-12 DIAGNOSIS — C61 Malignant neoplasm of prostate: Secondary | ICD-10-CM | POA: Diagnosis not present

## 2019-10-13 ENCOUNTER — Ambulatory Visit
Admission: RE | Admit: 2019-10-13 | Discharge: 2019-10-13 | Disposition: A | Discharge status: Home / Self Care | Payer: 59 | Source: Ambulatory Visit | Attending: Radiation Oncology | Admitting: Radiation Oncology

## 2019-10-13 ENCOUNTER — Other Ambulatory Visit: Payer: Self-pay

## 2019-10-13 ENCOUNTER — Ambulatory Visit: Payer: 59

## 2019-10-13 DIAGNOSIS — C61 Malignant neoplasm of prostate: Secondary | ICD-10-CM | POA: Diagnosis not present

## 2019-10-16 ENCOUNTER — Ambulatory Visit
Admission: RE | Admit: 2019-10-16 | Discharge: 2019-10-16 | Disposition: A | Discharge status: Home / Self Care | Payer: 59 | Source: Ambulatory Visit | Attending: Radiation Oncology | Admitting: Radiation Oncology

## 2019-10-16 ENCOUNTER — Other Ambulatory Visit: Payer: Self-pay

## 2019-10-16 ENCOUNTER — Ambulatory Visit: Payer: 59

## 2019-10-16 DIAGNOSIS — C61 Malignant neoplasm of prostate: Secondary | ICD-10-CM | POA: Diagnosis not present

## 2019-10-17 ENCOUNTER — Encounter: Payer: Self-pay | Admitting: Medical Oncology

## 2019-10-17 ENCOUNTER — Other Ambulatory Visit: Payer: Self-pay

## 2019-10-17 ENCOUNTER — Ambulatory Visit
Admission: RE | Admit: 2019-10-17 | Discharge: 2019-10-17 | Disposition: A | Discharge status: Home / Self Care | Payer: 59 | Source: Ambulatory Visit | Attending: Radiation Oncology | Admitting: Radiation Oncology

## 2019-10-17 ENCOUNTER — Ambulatory Visit: Payer: 59

## 2019-10-17 DIAGNOSIS — C61 Malignant neoplasm of prostate: Secondary | ICD-10-CM | POA: Diagnosis not present

## 2019-10-17 NOTE — Progress Notes (Signed)
Patient has multiple friends with prostate cancer who have had bone density test. He questions why he hasn't had a bone density test and they have. Ashlyn and Dr. Tammi Klippel deferred to Dr. Melina Modena decide the need for the test.Per Dr. Alinda Money,   the risk of developing osteoporosis/bone loss is increased proportionally to the length of ADT that a man receives. Since he will only be on ADT for 6 months (hopefully), his risk for developing significant bone loss is quite minimal. Otherwise, just having prostate cancer does not place him at an increased risk. I assume his friends are people who have been on ADT longer term.  I discussed this with patient and he voiced understanding. He states his insurance company had sent him a letter about a bone density so he just wanted to make sure he did not need. He states he has been doing well with radiation but last week, felt like he hit a wall. We discussed how fatigue increases as he move towards the end of treatment, and he may need to take extra rest/nap breaks. I also encouraged him to stay hydrated in the heat as well. He was very appreciative of Dr. Lynne Logan input and voiced understanding of the above.

## 2019-10-18 ENCOUNTER — Other Ambulatory Visit: Payer: Self-pay

## 2019-10-18 ENCOUNTER — Ambulatory Visit: Payer: 59

## 2019-10-18 ENCOUNTER — Ambulatory Visit
Admission: RE | Admit: 2019-10-18 | Discharge: 2019-10-18 | Disposition: A | Discharge status: Home / Self Care | Payer: 59 | Source: Ambulatory Visit | Attending: Radiation Oncology | Admitting: Radiation Oncology

## 2019-10-18 DIAGNOSIS — C61 Malignant neoplasm of prostate: Secondary | ICD-10-CM | POA: Diagnosis not present

## 2019-10-19 ENCOUNTER — Ambulatory Visit
Admission: RE | Admit: 2019-10-19 | Discharge: 2019-10-19 | Disposition: A | Discharge status: Home / Self Care | Payer: 59 | Source: Ambulatory Visit | Attending: Radiation Oncology | Admitting: Radiation Oncology

## 2019-10-19 ENCOUNTER — Ambulatory Visit: Payer: 59

## 2019-10-19 ENCOUNTER — Other Ambulatory Visit: Payer: Self-pay

## 2019-10-19 DIAGNOSIS — C61 Malignant neoplasm of prostate: Secondary | ICD-10-CM | POA: Diagnosis not present

## 2019-10-20 ENCOUNTER — Other Ambulatory Visit: Payer: Self-pay

## 2019-10-20 ENCOUNTER — Ambulatory Visit: Payer: 59

## 2019-10-20 ENCOUNTER — Ambulatory Visit
Admission: RE | Admit: 2019-10-20 | Discharge: 2019-10-20 | Disposition: A | Discharge status: Home / Self Care | Payer: 59 | Source: Ambulatory Visit | Attending: Radiation Oncology | Admitting: Radiation Oncology

## 2019-10-20 DIAGNOSIS — C61 Malignant neoplasm of prostate: Secondary | ICD-10-CM | POA: Diagnosis not present

## 2019-10-23 ENCOUNTER — Ambulatory Visit
Admission: RE | Admit: 2019-10-23 | Discharge: 2019-10-23 | Disposition: A | Discharge status: Home / Self Care | Payer: 59 | Source: Ambulatory Visit | Attending: Radiation Oncology | Admitting: Radiation Oncology

## 2019-10-23 ENCOUNTER — Other Ambulatory Visit: Payer: Self-pay

## 2019-10-23 ENCOUNTER — Ambulatory Visit: Payer: 59

## 2019-10-23 DIAGNOSIS — C61 Malignant neoplasm of prostate: Secondary | ICD-10-CM | POA: Diagnosis not present

## 2019-10-24 ENCOUNTER — Ambulatory Visit: Payer: 59

## 2019-10-24 ENCOUNTER — Other Ambulatory Visit: Payer: Self-pay

## 2019-10-24 ENCOUNTER — Ambulatory Visit
Admission: RE | Admit: 2019-10-24 | Discharge: 2019-10-24 | Disposition: A | Discharge status: Home / Self Care | Payer: 59 | Source: Ambulatory Visit | Attending: Radiation Oncology | Admitting: Radiation Oncology

## 2019-10-24 DIAGNOSIS — C61 Malignant neoplasm of prostate: Secondary | ICD-10-CM | POA: Diagnosis not present

## 2019-10-25 ENCOUNTER — Ambulatory Visit: Payer: 59

## 2019-10-25 ENCOUNTER — Other Ambulatory Visit: Payer: Self-pay

## 2019-10-25 ENCOUNTER — Ambulatory Visit
Admission: RE | Admit: 2019-10-25 | Discharge: 2019-10-25 | Disposition: A | Discharge status: Home / Self Care | Payer: 59 | Source: Ambulatory Visit | Attending: Radiation Oncology | Admitting: Radiation Oncology

## 2019-10-25 DIAGNOSIS — C61 Malignant neoplasm of prostate: Secondary | ICD-10-CM | POA: Diagnosis not present

## 2019-10-26 ENCOUNTER — Other Ambulatory Visit: Payer: Self-pay

## 2019-10-26 ENCOUNTER — Ambulatory Visit
Admission: RE | Admit: 2019-10-26 | Discharge: 2019-10-26 | Disposition: A | Discharge status: Home / Self Care | Payer: 59 | Source: Ambulatory Visit | Attending: Radiation Oncology | Admitting: Radiation Oncology

## 2019-10-26 ENCOUNTER — Ambulatory Visit: Payer: 59

## 2019-10-26 DIAGNOSIS — C61 Malignant neoplasm of prostate: Secondary | ICD-10-CM | POA: Diagnosis not present

## 2019-10-27 ENCOUNTER — Ambulatory Visit: Payer: 59

## 2019-10-27 ENCOUNTER — Ambulatory Visit
Admission: RE | Admit: 2019-10-27 | Discharge: 2019-10-27 | Disposition: A | Discharge status: Home / Self Care | Payer: 59 | Source: Ambulatory Visit | Attending: Radiation Oncology | Admitting: Radiation Oncology

## 2019-10-27 ENCOUNTER — Other Ambulatory Visit: Payer: Self-pay

## 2019-10-27 DIAGNOSIS — C61 Malignant neoplasm of prostate: Secondary | ICD-10-CM | POA: Diagnosis not present

## 2019-10-30 ENCOUNTER — Ambulatory Visit
Admission: RE | Admit: 2019-10-30 | Discharge: 2019-10-30 | Disposition: A | Discharge status: Home / Self Care | Payer: 59 | Source: Ambulatory Visit | Attending: Radiation Oncology | Admitting: Radiation Oncology

## 2019-10-30 ENCOUNTER — Ambulatory Visit: Payer: 59

## 2019-10-30 ENCOUNTER — Other Ambulatory Visit: Payer: Self-pay

## 2019-10-30 DIAGNOSIS — C61 Malignant neoplasm of prostate: Secondary | ICD-10-CM | POA: Diagnosis not present

## 2019-10-31 ENCOUNTER — Other Ambulatory Visit: Payer: Self-pay

## 2019-10-31 ENCOUNTER — Ambulatory Visit
Admission: RE | Admit: 2019-10-31 | Discharge: 2019-10-31 | Disposition: A | Discharge status: Home / Self Care | Payer: 59 | Source: Ambulatory Visit | Attending: Radiation Oncology | Admitting: Radiation Oncology

## 2019-10-31 DIAGNOSIS — C61 Malignant neoplasm of prostate: Secondary | ICD-10-CM | POA: Diagnosis not present

## 2019-11-01 ENCOUNTER — Other Ambulatory Visit: Payer: Self-pay

## 2019-11-01 ENCOUNTER — Ambulatory Visit
Admission: RE | Admit: 2019-11-01 | Discharge: 2019-11-01 | Disposition: A | Discharge status: Home / Self Care | Payer: 59 | Source: Ambulatory Visit | Attending: Radiation Oncology | Admitting: Radiation Oncology

## 2019-11-01 DIAGNOSIS — C61 Malignant neoplasm of prostate: Secondary | ICD-10-CM | POA: Diagnosis not present

## 2019-11-02 ENCOUNTER — Other Ambulatory Visit: Payer: Self-pay

## 2019-11-02 ENCOUNTER — Ambulatory Visit
Admission: RE | Admit: 2019-11-02 | Discharge: 2019-11-02 | Disposition: A | Discharge status: Home / Self Care | Payer: 59 | Source: Ambulatory Visit | Attending: Radiation Oncology | Admitting: Radiation Oncology

## 2019-11-02 DIAGNOSIS — C61 Malignant neoplasm of prostate: Secondary | ICD-10-CM | POA: Diagnosis not present

## 2019-11-03 ENCOUNTER — Other Ambulatory Visit: Payer: Self-pay

## 2019-11-03 ENCOUNTER — Encounter: Payer: Self-pay | Admitting: Medical Oncology

## 2019-11-03 ENCOUNTER — Encounter: Payer: Self-pay | Admitting: Radiation Oncology

## 2019-11-03 ENCOUNTER — Ambulatory Visit
Admission: RE | Admit: 2019-11-03 | Discharge: 2019-11-03 | Disposition: A | Discharge status: Home / Self Care | Payer: 59 | Source: Ambulatory Visit | Attending: Radiation Oncology | Admitting: Radiation Oncology

## 2019-11-03 DIAGNOSIS — C61 Malignant neoplasm of prostate: Secondary | ICD-10-CM | POA: Diagnosis not present

## 2019-11-15 ENCOUNTER — Other Ambulatory Visit: Payer: Self-pay | Admitting: Family Medicine

## 2019-11-15 NOTE — Telephone Encounter (Signed)
LMOM for pt to CB to schedule fasting lipid panel prior to needing RF sent.

## 2019-11-20 ENCOUNTER — Encounter: Payer: Self-pay | Admitting: Medical Oncology

## 2019-12-05 ENCOUNTER — Telehealth: Payer: Self-pay

## 2019-12-05 ENCOUNTER — Other Ambulatory Visit: Payer: Self-pay

## 2019-12-05 ENCOUNTER — Encounter: Payer: Self-pay | Admitting: Urology

## 2019-12-05 NOTE — Telephone Encounter (Signed)
Spoke with patient in regards to 1 month telephone follow-up appointment with Freeman Caldron PA on 12/07/19 at 2:00pm. Patient verbalized understanding of appointment date and time. Meaningful use, AUA and prostate questions were reviewed.

## 2019-12-05 NOTE — Progress Notes (Signed)
Patient has 1 month telephone follow-up appointment with Ashlyn Bruning PA. Patient states that Saturday he had a kedney stone Saturday. Patient states that he has had dysuria and hematuria due to the kidney stone. Patient states increased numbness in both legs along with back pain that has increased after radiation treatment. Patient states nocturia 2 times per day. Patient states urine stream is strong. Patient states that he is emptying his bladder most of the time. Patient states having urgency. Patient denies hesitancy or straining. Patient states that he has random leakage especially when he is bending over.

## 2019-12-06 NOTE — Progress Notes (Signed)
Radiation Oncology         [phone] ________________________________  Name: TREMAIN RUCINSKI MRN: [id number]  Date: 12/07/2019  DOB: 09-21-1955  Post Treatment Note  CC: McGowen, Adrian Blackwater, MD  Raynelle Bring, MD  Diagnosis:   64 y.o. gentleman with rising, detectable PSA at 0.19 s/p RALP in 07/2016 for Stage pT3b, pN0 Gleason 3+4 prostate cancer  Interval Since Last Radiation:  5 weeks  09/12/19 - 11/03/19:  1. The prostate fossa and pelvic lymph nodes were initially treated to 45 Gy in 25 fractions of 1.8 Gy  2. The prostate fossa only was boosted to 68.4 Gy with 13 additional fractions of 1.8 Gy; concurrent with ST-ADT  Narrative: I spoke with the patient to conduct his routine scheduled 1 month follow up visit via telephone to spare the patient unnecessary potential exposure in the healthcare setting during the current COVID-19 pandemic.  The patient was notified in advance and gave permission to proceed with this visit format.   He tolerated the radiation treatments relatively well with only mild urinary symptoms and modest fatigue.                              On review of systems, the patient states that he is doing very well in general.  He reports gradual improvement in his LUTS and currently his only complaints are of increased urgency, occasional small-volume leakage and nocturia 2 times per night.  He was having some dysuria and hematuria as well as flank pain last weekend, prompting him to seek evaluation at the emergency department in [place] while he was away on vacation at the beach with his family.  He was diagnosed with a 3 mm kidney stone and feels like he has probably passed it since his pain has resolved but he did not actually see the stone.  He does have a remote history of kidney stones, approximately 20 years ago.  The dysuria, hematuria and flank pain have resolved completely and he denies excessive daytime frequency, weak stream, straining to void or incomplete  bladder emptying.  He has had some low back pain that radiates into the buttocks and down his legs which started during the course of radiation and has persisted.  He reports that this has not been significant enough to warrant seeking evaluation and seems to be gradually improving.  He denies any abdominal pain, nausea, vomiting, diarrhea or constipation.  He reports a healthy appetite and is maintaining his weight.  He has continued with periodic hot flashes and mild fatigue associated with the ADT.  Overall, he is quite pleased with his progress to date.  ALLERGIES:  is allergic to atorvastatin.  Meds: Current Outpatient Medications  Medication Sig Dispense Refill  . cetirizine (ZYRTEC) 10 MG tablet Take 10 mg by mouth daily as needed for allergies.    . Multiple Vitamin (MULTIVITAMIN) tablet Take 1 tablet by mouth daily.    . pravastatin (PRAVACHOL) 20 MG tablet TAKE 1 TABLET DAILY 90 tablet 0  . Turmeric (QC TUMERIC COMPLEX PO) Take by mouth daily.     No current facility-administered medications for this encounter.    Physical Findings:  vitals were not taken for this visit.   /Unable to assess due to telephone follow-up visit format.  Lab Findings: Lab Results  Component Value Date   WBC 6.3 05/04/2019   HGB 15.4 05/04/2019   HCT 44.8 05/04/2019   MCV 91.4  05/04/2019   PLT 285 05/04/2019     Radiographic Findings: No results found.  Impression/Plan: 1. 64 y.o. gentleman with rising, detectable PSA at 0.19 s/p RALP in 07/2016 for Stage pT3b, pN0 Gleason 3+4 prostate cancer. He will continue to follow up with urology for ongoing PSA determinations but does not have a follow-up appointment scheduled with Dr. Alinda Money to his knowledge.  He continues to tolerate the ADT fairly well despite hot flashes.  He understands what to expect with regards to PSA monitoring going forward. I will look forward to following his response to treatment via correspondence with urology, and would be  happy to continue to participate in his care if clinically indicated. I talked to the patient about what to expect in the future, including his risk for erectile dysfunction and rectal bleeding. I encouraged him to call or return to the office if he has any questions regarding his previous radiation or possible radiation side effects. He was comfortable with this plan and will follow up as needed.  Today, a comprehensive survivorship care plan and treatment summary was reviewed with the patient today detailing his prostate cancer diagnosis, treatment course, potential late/long-term effects of treatment, appropriate follow-up care with recommendations for the future, and patient education resources.  A copy of this summary, along with a letter will be sent to the patient's primary care provider via fax after today's visit.  2. Cancer screening:  Due to Mr. Denardo history and his age, he should receive screening for skin cancers and colon cancer.  The information and recommendations are listed on the patient's comprehensive care plan/treatment summary and were reviewed in detail with the patient.     3. Health maintenance and wellness promotion: Mr. Milosevic was encouraged to consume 5-7 servings of fruits and vegetables per day. He was provided a copy of the "Nut[employer]" handout, as well as the handout "Take Control of Your Health and [place]" from the [employer].  He was also encouraged to engage in moderate to vigorous exercise for 30 minutes per day most days of the week. Information was provided regarding the [facility] fitness program, which is designed for cancer survivors to help them become more physically fit after cancer treatments. We discussed that a healthy BMI is 18.5-24.9 and that maintaining a healthy weight reduces risk of cancer recurrences.  He was instructed to limit his alcohol consumption and continue to abstain from tobacco use.  Lastly, he was  encouraged to use sunscreen and wear protective clothing when in the sun.     4. Support services/counseling: It is not uncommon for this period of the patient's cancer care trajectory to be one of many emotions and stressors.  Mr. Krauter was encouraged to take advantage of our many support services programs, support groups, and/or counseling in coping with his new life as a cancer survivor after completing anti-cancer treatment.  He was offered support today through active listening and expressive supportive counseling.  He was given information regarding our available services and encouraged to contact me with any questions or for help enrolling in any of our support group/programs.        Nicholos Johns, PA-C

## 2019-12-07 ENCOUNTER — Ambulatory Visit
Admission: RE | Admit: 2019-12-07 | Discharge: 2019-12-07 | Disposition: A | Discharge status: Home / Self Care | Payer: 59 | Source: Ambulatory Visit | Attending: Urology | Admitting: Urology

## 2019-12-07 DIAGNOSIS — C61 Malignant neoplasm of prostate: Secondary | ICD-10-CM

## 2019-12-18 ENCOUNTER — Other Ambulatory Visit: Payer: Self-pay

## 2019-12-19 ENCOUNTER — Other Ambulatory Visit: Payer: Self-pay

## 2019-12-19 ENCOUNTER — Ambulatory Visit (INDEPENDENT_AMBULATORY_CARE_PROVIDER_SITE_OTHER): Payer: 59 | Admitting: Family Medicine

## 2019-12-19 ENCOUNTER — Encounter: Payer: Self-pay | Admitting: Family Medicine

## 2019-12-19 VITALS — BP 117/81 | HR 74 | Resp 18 | Ht 72.0 in | Wt 208.4 lb

## 2019-12-19 DIAGNOSIS — E7800 Pure hypercholesterolemia, unspecified: Secondary | ICD-10-CM | POA: Diagnosis not present

## 2019-12-19 NOTE — Progress Notes (Signed)
OFFICE VISIT  12/19/2019   CC:  Chief Complaint  Patient presents with  . Follow-up    medication review & bloodwork .    HPI:    Patient is a 64 y.o. Caucasian male who presents for f/u hypercholesterolemia, on statin medication. Kidney stone a few weeks ago. Recent corneal injury, will be seeing cornea specialist soon.  Finished his RT for recurrence of prostate ca, got lupron, has f/u with Dr. Alinda Money soon to see if any further tx planned at this time.  Stamina down some and has had some hot flashes since lupron.  Staying active, yard work, recently helped his daughter move into apartment at [employer]. Still walking regularly, still having bilat leg numbness of unclear etiology.  HLD: no probs with his cholesterol med.    ROS: no fevers, no CP, no SOB, no wheezing, no cough, no dizziness, no HAs, no rashes, no melena/hematochezia.  No polyuria or polydipsia.  No myalgias or arthralgias.  No focal weakness, paresthesias, or tremors.   No n/v/d or abd pain.  No palpitations.    Past Medical History:  Diagnosis Date  . Atypical chest pain 05/2019   Insurance denied stress test I ordered.  Pt chose watchful waiting approach at that time.  . CTS (carpal tunnel syndrome)    bilat (NCS/EMG - confirmed 06/12/18)  . Diastasis of rectus abdominis   . Erectile dysfunction    sildenafil prn helpful  . History of kidney stones    bladder  . Hyperlipidemia    Mild.  No meds in past records.  Recommended statin 05/2017 but pt wanted to try TLC first.  Lipids unchanged 09/2017 so on 09/09/17 he started atorva 20mg .  . IFG (impaired fasting glucose)    Per old records.  A1c 5.7% 05/2017.  A1c 5.3% 10/2017.  . Incisional hernia without obstruction or gangrene 05/2017  . Lumbar spondylosis    w/pars defect and listhesis (Dr. Saintclair Halsted).  Referred by neurosurg to pain mgmt MD and pt got ESI x 2 ---no help.  . Paresthesias    UEs and LE's 2019---recommended neuro referral and pt wanted to think about  it.  Then, Dr. Posey Pronto evaluated him 05/2018 and ordered NCS/EMGs to further eval feet paresthesias.  . Prostate cancer [facility])    Locally advanced prostate ca 06/2016.  Prostatectomy  RAL radical prostatectomy 07/2016.  Slight PSA rise to 0.067 01/2019, then to 0.19 07/2019--biochemical recurrence suspected. Plan for ADT+RT planned.   . Pulmonary nodule 2009   Stable CT chest 2010--no further scans needed in pt at low risk for lung ca.  . 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    Past Surgical History:  Procedure Laterality Date  . COLONOSCOPY  04/20/11   Normal: recall 10 yrs  . ett  approx 2010   normal  . KNEE ARTHROSCOPY  2000   left  . LYMPHADENECTOMY Bilateral 08/06/2016   Procedure: PELVIC LYMPHADENECTOMY;  Surgeon: Raynelle Bring, MD;  Location: [facility];  Service: Urology;  Laterality: Bilateral;  . NCV & EMG  06/02/2018; 06/14/2018   07/03/2018 (LEGS)->NORMAL.  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  Marland Kitchen REFRACTIVE SURGERY  2000   both eyes  . ROBOT ASSISTED LAPAROSCOPIC RADICAL PROSTATECTOMY N/A 08/06/2016   Procedure: Path: adenocarcinoma with extraprostatic extension present, negative margins, lymph nodes NEG. XI ROBOTIC ASSISTED LAPAROSCOPIC RADICAL PROSTATECTOMY LEVEL 2;  Surgeon: Raynelle Bring, MD;  Location: [facility];  Service: Urology;  Laterality: N/A;  . ROTATOR CUFF REPAIR  2002   right  . ROTATOR CUFF REPAIR  2003   left    Outpatient Medications Prior to Visit  Medication Sig Dispense Refill  . cetirizine (ZYRTEC) 10 MG tablet Take 10 mg by mouth daily as needed for allergies.    . Multiple Vitamin (MULTIVITAMIN) tablet Take 1 tablet by mouth daily.    Marland Kitchen oxyCODONE-acetaminophen (PERCOCET/ROXICET) 5-325 MG tablet Take 1 tablet by mouth every 6 (six) hours as needed. (Patient not taking: Reported on 12/19/2019)    . pravastatin (PRAVACHOL) 20 MG tablet TAKE  1 TABLET DAILY 90 tablet 0  . prednisoLONE acetate (PRED FORTE) 1 % ophthalmic suspension Place 1 drop into the right eye 3 (three) times daily.    . promethazine (PHENERGAN) 25 MG tablet Take 25 mg by mouth every 6 (six) hours as needed. (Patient not taking: Reported on 12/19/2019)    . tamsulosin (FLOMAX) 0.4 MG CAPS capsule Take 0.4 mg by mouth daily. (Patient not taking: Reported on 12/19/2019)    . Turmeric (QC TUMERIC COMPLEX PO) Take by mouth daily. (Patient not taking: Reported on 12/19/2019)     No facility-administered medications prior to visit.    Allergies  Allergen Reactions  . Atorvastatin Other (See Comments)    Myalgias/arthralgias    ROS As per HPI  PE: Vitals with BMI 12/19/2019 08/15/2019 05/16/2019  Height 6\' 0"  6\' 0"  -  Weight 208 lbs 6 oz 205 lbs -  BMI 26.94 85.4 -  Systolic 627 - 035  Diastolic 81 - 82  Pulse 74 - -  O2 sat on RA today is 95%  Gen: Alert, well appearing.  Patient is oriented to person, place, time, and situation. AFFECT: pleasant, lucid thought and speech. CV: RRR, no m/r/g.   LUNGS: CTA bilat, nonlabored resps, good aeration in all lung fields. EXT: no clubbing or cyanosis.  no edema.    LABS:  Lab Results  Component Value Date   TSH 1.33 12/13/2018   Lab Results  Component Value Date   WBC 6.3 05/04/2019   HGB 15.4 05/04/2019   HCT 44.8 05/04/2019   MCV 91.4 05/04/2019   PLT 285 05/04/2019   Lab Results  Component Value Date   CREATININE 0.95 05/16/2019   BUN 12 05/16/2019   NA 137 05/16/2019   K 4.3 05/16/2019   CL 105 05/16/2019   CO2 23 05/16/2019   Lab Results  Component Value Date   ALT 19 12/13/2018   AST 18 12/13/2018   ALKPHOS 60 01/06/2017   BILITOT 0.7 12/13/2018   Lab Results  Component Value Date   CHOL 206 (H) 05/16/2019   Lab Results  Component Value Date   HDL 60 05/16/2019   Lab Results  Component Value Date   LDLCALC 129 (H) 05/16/2019   Lab Results  Component Value Date   TRIG 80  05/16/2019   Lab Results  Component Value Date   CHOLHDL 3.4 05/16/2019   Lab Results  Component Value Date   PSA 0.067 01/23/2019   PSA 0.033 06/15/2018   Lab Results  Component Value Date   HGBA1C 5.6 05/16/2019    IMPRESSION AND PLAN:  Hypercholesterolemia: tolerating pravastatin. cmet and flp today. Continue efforts and low carb/low fat/low chol diet and maximize exercise.  An After Visit Summary was printed and given to the patient.  FOLLOW UP: No follow-ups on file.  Signed:  Crissie Sickles, MD  12/19/2019     

## 2019-12-20 LAB — COMPREHENSIVE METABOLIC PANEL
AG Ratio: 2 (calc) (ref 1.0–2.5)
ALT: 38 U/L (ref 9–46)
AST: 25 U/L (ref 10–35)
Albumin: 4.3 g/dL (ref 3.6–5.1)
Alkaline phosphatase (APISO): 52 U/L (ref 35–144)
BUN: 19 mg/dL (ref 7–25)
CO2: 27 mmol/L (ref 20–32)
Calcium: 10 mg/dL (ref 8.6–10.3)
Chloride: 105 mmol/L (ref 98–110)
Creat: 0.89 mg/dL (ref 0.70–1.25)
Globulin: 2.2 g/dL (calc) (ref 1.9–3.7)
Glucose, Bld: 102 mg/dL — ABNORMAL HIGH (ref 65–99)
Potassium: 4.8 mmol/L (ref 3.5–5.3)
Sodium: 139 mmol/L (ref 135–146)
Total Bilirubin: 0.7 mg/dL (ref 0.2–1.2)
Total Protein: 6.5 g/dL (ref 6.1–8.1)

## 2019-12-20 LAB — LIPID PANEL
Cholesterol: 204 mg/dL — ABNORMAL HIGH (ref <200)
HDL: 64 mg/dL (ref >=40)
LDL Cholesterol (Calc): 124 mg/dL (calc) — ABNORMAL HIGH
Non-HDL Cholesterol (Calc): 140 mg/dL (calc) — ABNORMAL HIGH (ref <130)
Total CHOL/HDL Ratio: 3.2 (calc) (ref <5.0)
Triglycerides: 65 mg/dL (ref <150)

## 2020-01-22 ENCOUNTER — Encounter: Payer: Self-pay | Admitting: Family Medicine

## 2020-01-22 ENCOUNTER — Telehealth: Payer: Self-pay

## 2020-01-22 NOTE — Telephone Encounter (Signed)
Zetia generic 10mg  tabs, 1 tab po qd, #30, RF x 2. Lab visit for FLP in 2-3 mo, dx pure hypercholesterolemia and statin intolerance.-thx

## 2020-01-22 NOTE — Telephone Encounter (Signed)
Pt was seen on 8/10 for cholesterol f/u and labs done at that time. Based on results, it was recommended to increase his pravastatin to 40mg  daily. He left a VM earlier stating he was having joint pain from pravastatin. He would like to know if he has other options available besides statins.  Please advise, thanks.

## 2020-01-23 ENCOUNTER — Other Ambulatory Visit: Payer: Self-pay

## 2020-01-23 DIAGNOSIS — E7800 Pure hypercholesterolemia, unspecified: Secondary | ICD-10-CM

## 2020-01-23 DIAGNOSIS — Z789 Other specified health status: Secondary | ICD-10-CM

## 2020-01-23 MED ORDER — EZETIMIBE 10 MG PO TABS
10.0000 mg | ORAL_TABLET | Freq: Every day | ORAL | 2 refills | Status: DC
Start: 2020-01-23 — End: 2020-02-27

## 2020-01-23 NOTE — Telephone Encounter (Signed)
Rx sent in. Patient made aware. Future lab entered, lab visit scheduled.

## 2020-02-07 ENCOUNTER — Telehealth: Payer: Self-pay

## 2020-02-07 NOTE — Telephone Encounter (Signed)
Stop zetia. See if all symptoms go away. If they all go away then restart zetia and see if they return. If they do return then we know for sure it is the zetia.

## 2020-02-07 NOTE — Telephone Encounter (Signed)
He was taking pravastatin and d/c on 9/13 switching to zetia same day. Due for f/u labs in 2-3 months. The best way he could describe symptoms were it felt like he twisted his ankle and tendons in leg/feet hurt. Unsure if this is exact cause of pain but feels like time frame matches up with med change.  Please advise, thanks.

## 2020-02-07 NOTE — Telephone Encounter (Signed)
Patient advised and voiced understanding.  

## 2020-02-07 NOTE — Telephone Encounter (Signed)
Pt called LVM stating severe foot pain & othr symptoms pt believes is side effect of cholesterol medication. Please call pt [phone].

## 2020-02-15 ENCOUNTER — Encounter: Payer: Self-pay | Admitting: Family Medicine

## 2020-02-15 ENCOUNTER — Telehealth: Payer: Self-pay

## 2020-02-15 NOTE — Telephone Encounter (Signed)
Noted  

## 2020-02-15 NOTE — Telephone Encounter (Signed)
Patient called to speak with [facility].  He stated that he had speaking to her recently.  He did not go into detail.  After review of chart, assuming he is calling about medications.  Please call [phone].   Thank you

## 2020-02-15 NOTE — Telephone Encounter (Signed)
See other encounter.

## 2020-02-15 NOTE — Telephone Encounter (Signed)
LVM for pt to CB regarding prior message.  ?

## 2020-02-15 NOTE — Telephone Encounter (Addendum)
Spoke with pt and pt stated after stopping medication and the sx are still there. Pt states he can feel the pain radiating from the back. And has hx of back pain for approx 3 years. MRI results below from 07/09/16. Pt sched for back pain 10/12. Pt instructed to restart rx.   IMPRESSION: No change since 2016. 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 Degeneration and bulging of the disc. Mild foraminal narrowing bilaterally without visible compression of the exiting L5 nerve roots.  Non-compressive disc bulges L3-4 and L4-5.

## 2020-02-16 ENCOUNTER — Ambulatory Visit: Payer: 59 | Admitting: Family Medicine

## 2020-02-20 ENCOUNTER — Encounter: Payer: Self-pay | Admitting: Family Medicine

## 2020-02-20 ENCOUNTER — Ambulatory Visit (INDEPENDENT_AMBULATORY_CARE_PROVIDER_SITE_OTHER): Payer: 59 | Admitting: Family Medicine

## 2020-02-20 ENCOUNTER — Other Ambulatory Visit: Payer: Self-pay

## 2020-02-20 VITALS — BP 125/80 | HR 88 | Temp 97.7°F | Resp 16 | Ht 72.0 in | Wt 210.8 lb

## 2020-02-20 DIAGNOSIS — M4306 Spondylolysis, lumbar region: Secondary | ICD-10-CM

## 2020-02-20 DIAGNOSIS — M47816 Spondylosis without myelopathy or radiculopathy, lumbar region: Secondary | ICD-10-CM | POA: Diagnosis not present

## 2020-02-20 DIAGNOSIS — M5410 Radiculopathy, site unspecified: Secondary | ICD-10-CM

## 2020-02-20 DIAGNOSIS — M722 Plantar fascial fibromatosis: Secondary | ICD-10-CM

## 2020-02-20 NOTE — Patient Instructions (Signed)
600 mg ibuprofen twice a day with food for at least 10d.

## 2020-02-20 NOTE — Progress Notes (Signed)
OFFICE VISIT  02/27/2020  CC:  Chief Complaint  Patient presents with  . Back Pain    4-5 years, has taken advil for relief   HPI:    Patient is a 64 y.o. Caucasian male who presents for back pain. He has hx of LBP; lumbar spondylosis with chronic bilat pars defects. NCS legs normal 2020. No hx of back surgery. Last MR lumbar spine was 07/09/16: IMPRESSION: No change since 2016. 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 Degeneration and bulging of the disc. Mild foraminal narrowing bilaterally without visible compression of the exiting L5 nerve roots.  Non-compressive disc bulges L3-4 and L4-5.  HPI: Chronic low back pain hx--hurts intermittently but not severe, tends to be "easy to go out", legs hurt all over---?burning?/aching? with intermittent R leg radiculopathy pain --medial aspect of leg down to foot. Now, recent bilat plantar feet pain--several weeks. Occ R leg shooting pain causes him to buckle R leg. Normal control of bowel and bladder.  No saddle anesthesia. Sounds like nothing new regarding his LB and chronic leg sx's, just the plantar feet pain is new, worst with getting out of bed and starting to walk each day.  Ibup 400mg  occ helpful.   Past Medical History:  Diagnosis Date  . Atypical chest pain 05/2019   Insurance denied stress test I ordered.  Pt chose watchful waiting approach at that time.  . CTS (carpal tunnel syndrome)    bilat (NCS/EMG - confirmed 06/12/18)  . Diastasis of rectus abdominis   . Erectile dysfunction    sildenafil prn helpful  . History of kidney stones    bladder  . Hyperlipidemia    Intol of atorva and prava.  . IFG (impaired fasting glucose)    Per old records.  A1c 5.7% 05/2017.  A1c 5.3% 10/2017.  . Incisional hernia without obstruction or gangrene 05/2017  . Lumbar spondylosis    w/pars defect and listhesis (Dr. Saintclair Halsted).  Referred by neurosurg to pain mgmt MD and pt got ESI x 2 ---no help.  . Paresthesias     UEs and LE's 2019---recommended neuro referral and pt wanted to think about it.  Then, Dr. Posey Pronto evaluated him 05/2018 and ordered NCS/EMGs to further eval feet paresthesias.  . Prostate cancer [facility])    Locally advanced prostate ca 06/2016.  Prostatectomy  RAL radical prostatectomy 07/2016.  Slight PSA rise to 0.067 01/2019, then to 0.19 07/2019--biochemical recurrence suspected. Plan for ADT+RT planned.   . Pulmonary nodule 2009   Stable CT chest 2010--no further scans needed in pt at low risk for lung ca.  . 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    Past Surgical History:  Procedure Laterality Date  . COLONOSCOPY  04/20/11   Normal: recall 10 yrs  . ett  approx 2010   normal  . KNEE ARTHROSCOPY  2000   left  . LYMPHADENECTOMY Bilateral 08/06/2016   Procedure: PELVIC LYMPHADENECTOMY;  Surgeon: Raynelle Bring, MD;  Location: [facility];  Service: Urology;  Laterality: Bilateral;  . NCV & EMG  06/02/2018; 06/14/2018   07/03/2018 (LEGS)->NORMAL.  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  Marland Kitchen REFRACTIVE SURGERY  2000   both eyes  . ROBOT ASSISTED LAPAROSCOPIC RADICAL PROSTATECTOMY N/A 08/06/2016   Procedure: Path: adenocarcinoma with extraprostatic extension present, negative margins, lymph nodes NEG. XI ROBOTIC ASSISTED LAPAROSCOPIC RADICAL PROSTATECTOMY LEVEL 2;  Surgeon:  Raynelle Bring, MD;  Location: [facility];  Service: Urology;  Laterality: N/A;  . ROTATOR CUFF REPAIR  2002   right  . ROTATOR CUFF REPAIR  2003   left    Outpatient Medications Prior to Visit  Medication Sig Dispense Refill  . Multiple Vitamin (MULTIVITAMIN) tablet Take 1 tablet by mouth daily.    Marland Kitchen ezetimibe (ZETIA) 10 MG tablet Take 1 tablet (10 mg total) by mouth daily. 30 tablet 2  . cetirizine (ZYRTEC) 10 MG tablet Take 10 mg by mouth daily as needed for allergies. (Patient not taking: Reported on  02/20/2020)    . pravastatin (PRAVACHOL) 20 MG tablet TAKE 1 TABLET DAILY (Patient not taking: Reported on 02/20/2020) 90 tablet 0  . prednisoLONE acetate (PRED FORTE) 1 % ophthalmic suspension Place 1 drop into the right eye 3 (three) times daily. (Patient not taking: Reported on 02/20/2020)     No facility-administered medications prior to visit.    Allergies  Allergen Reactions  . Atorvastatin Other (See Comments)    Myalgias/arthralgias    ROS As per HPI  PE: Vitals with BMI 02/21/2020 02/20/2020 12/19/2019  Height 6\' 0"  6\' 0"  6\' 0"   Weight 210 lbs 3 oz 210 lbs 13 oz 208 lbs 6 oz  BMI 28.5 78.58 85.02  Systolic 774 128 786  Diastolic 74 80 81  Pulse 89 88 74   Gen: Alert, well appearing.  Patient is oriented to person, place, time, and situation. AFFECT: pleasant, lucid thought and speech. Lumbar ROM intact w/out pain/stiffness, but a few seconds after ROM testing today he said he felt a twinge of R lumbar paraspinous pain and increased sensitivity in R leg. Leg strength 5/5 prox/dist bilat.  DTRs: trace patellar and achilles, symmetric. Feet: TTP over plantar aspect of both feet, particularly over calcaneal tubercle.   LABS:    Chemistry      Component Value Date/Time   NA 139 12/19/2019 1102   NA 140 01/06/2017 1535   K 4.8 12/19/2019 1102   CL 105 12/19/2019 1102   CO2 27 12/19/2019 1102   BUN 19 12/19/2019 1102   BUN 13 01/06/2017 1535   CREATININE 0.89 12/19/2019 1102   GLU 120 01/06/2017 1535      Component Value Date/Time   CALCIUM 10.0 12/19/2019 1102   ALKPHOS 60 01/06/2017 1535   AST 25 12/19/2019 1102   ALT 38 12/19/2019 1102   BILITOT 0.7 12/19/2019 1102      IMPRESSION AND PLAN:  1) Acute plantar feet pain: question of plantar fasciitis but not entirely convincing clinical picture for this. Will refer to sports med for further evaluation. Ibup 600 mg bid x 7d recommended.  2) Lumbar spondylosis with chronic bilat pars defects, also R leg  radiculopathy sx's intermittently. No new symptomatology today.  An After Visit Summary was printed and given to the patient.  FOLLOW UP: Return for as needed.  Signed:  Crissie Sickles, MD           02/27/2020

## 2020-02-21 ENCOUNTER — Encounter: Payer: Self-pay | Admitting: Family Medicine

## 2020-02-21 ENCOUNTER — Ambulatory Visit: Payer: Self-pay

## 2020-02-21 ENCOUNTER — Ambulatory Visit (INDEPENDENT_AMBULATORY_CARE_PROVIDER_SITE_OTHER): Payer: 59 | Admitting: Family Medicine

## 2020-02-21 ENCOUNTER — Other Ambulatory Visit: Payer: Self-pay

## 2020-02-21 ENCOUNTER — Ambulatory Visit: Payer: 59 | Admitting: Family Medicine

## 2020-02-21 VITALS — BP 138/74 | HR 89 | Ht 72.0 in | Wt 210.2 lb

## 2020-02-21 DIAGNOSIS — M79671 Pain in right foot: Secondary | ICD-10-CM

## 2020-02-21 DIAGNOSIS — M7741 Metatarsalgia, right foot: Secondary | ICD-10-CM | POA: Diagnosis not present

## 2020-02-21 DIAGNOSIS — C61 Malignant neoplasm of prostate: Secondary | ICD-10-CM

## 2020-02-21 DIAGNOSIS — M4306 Spondylolysis, lumbar region: Secondary | ICD-10-CM | POA: Diagnosis not present

## 2020-02-21 DIAGNOSIS — M7742 Metatarsalgia, left foot: Secondary | ICD-10-CM

## 2020-02-21 DIAGNOSIS — M79672 Pain in left foot: Secondary | ICD-10-CM

## 2020-02-21 NOTE — Patient Instructions (Addendum)
Thank you for coming in today.  Plan for metatarsal pads.  Can get insoles for metatarsalgia or use metatarsal pads from [facility].   Plan for PT for the back.   Recheck in 6-8 weeks.  If not better let me know.   Please use voltaren gel up to 4x daily for pain as needed.

## 2020-02-21 NOTE — Progress Notes (Signed)
Subjective:    I'm seeing this patient as a consultation for:  Dr. Anitra Lauth. Note will be routed back to referring provider/PCP.  CC: B plantar foot pain and chronic LBP  I, Molly Weber, LAT, ATC, am serving as scribe for Dr. Lynne Leader.  HPI: Pt is a 64 y/o male presenting w/ c/o B plantar foot pain x several weeks and chronic LBP.  B foot pain: Began about 3 weeks ago w/ no known MOI. 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 -Radiating pain: yes in his B LEs from inner thigh to his plantar feets -LE numbness/tingling: yes in his B LEs, R>L -LE weakness: yes intermittently  -Treatments tried: prior epidurals and has seen Dr. Saintclair Halsted at [facility]  Diagnostic imaging: L-spine MRI w and w/o contrast- 07/09/16  Past medical history, Surgical history, Family history, Social history, Allergies, and medications have been entered into the medical record, reviewed. 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  Additionally history of prostate cancer status post prostatectomy with chemical recurrence requiring radiation therapy.  Review of Systems: No new headache, visual changes, nausea, vomiting, diarrhea, constipation, dizziness, abdominal pain, skin rash, fevers, chills, night sweats, weight loss, swollen lymph nodes, body aches, joint swelling, muscle aches, chest pain, shortness of breath, mood changes, visual or auditory hallucinations.   Objective:    Vitals:   02/21/20 1116  BP: 138/74  Pulse: 89  SpO2: 96%   General: Well Developed, well nourished, and in no acute distress.  Neuro/Psych: Alert and oriented x3, extra-ocular muscles intact, able to move all 4 extremities, sensation grossly intact. Skin: Warm and dry, no rashes noted.  Respiratory: Not using accessory muscles,  speaking in full sentences, trachea midline.  Cardiovascular: Pulses palpable, no extremity edema. Abdomen: Does not appear distended. MSK: Laterally cavus appearing feet.  Tender palpation volar forefoot especially at metatarsal heads 2 and 3 bilaterally.  Mildly tender palpation lateral mid arch.  Not particularly tender plantar calcaneus.  Normal gait. L-spine normal flexion.  Normal gait.  Lab and Radiology Results EXAM: MRI LUMBAR SPINE WITHOUT AND WITH CONTRAST  TECHNIQUE: Multiplanar and multiecho pulse sequences of the lumbar spine were obtained without and with intravenous contrast.  CONTRAST:  40mL MULTIHANCE GADOBENATE DIMEGLUMINE 529 MG/ML IV SOLN  COMPARISON:  04/17/2015  FINDINGS: Segmentation:  5 lumbar type vertebral bodies.  Alignment:  Anterolisthesis L5-S1 of 6 mm.  Vertebrae: No fracture of the vertebral bodies or primary bone lesion. Chronic pars defects L5. No evidence of metastatic disease.  Conus medullaris: Extends to the L1-2 level and appears normal.  Paraspinal and other soft tissues: Normal  Disc levels:  No abnormality at L2-3 or above.  L3-4: Mild bulging of the disc.  No stenosis or neural compression.  L4-5: Moderate bulging of the disc. No stenosis or neural compression.  L5-S1: Chronic bilateral pars defects with 6 mm anterolisthesis. Chronic disc degeneration with mild bulging of the disc. No central canal stenosis. Mild foraminal narrowing bilaterally without definite compression of the L5 nerve roots.  No change since 2016.  IMPRESSION: No change since 2016. 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 Degeneration and bulging of the disc. Mild foraminal narrowing bilaterally without visible compression of the exiting L5 nerve roots.  Non-compressive disc bulges L3-4 and L4-5.   Electronically Signed   By:  Nelson Chimes M.D.   On: 07/09/2016 14:46 I, Lynne Leader, personally  (independently) visualized and performed the interpretation of the images attached in this note. Pars defects with mild anterior listhesis.  No significant neurocompression.  Impression and Recommendations:    Assessment and Plan: 64 y.o. male with bilateral foot pain.  Patient's foot shape is at risk for stress fractures or stress reaction and metatarsalgia.  Pain at plantar forefoot near metatarsal heads is most consistent with metatarsalgia.  Will treat with metatarsal pads and cushioned insoles with good arch support and metatarsal pads built in.  If not improving he may benefit from podiatry custom orthotics.  His foot shape may be a bit challenging to find good over-the-counter solution for. Additionally would proceed with x-rays and further evaluation of feet if needed.  Back pain: Known pars defect.  Patient has been unable to work on his core strengthening recently.  He has had robotic prostatectomy then later external beam radiation therapy for prostate cancer.  This would significantly affect his core strength.  Plan for trial physical therapy and repeat imaging in the future if needed.Marland Kitchen  PDMP not reviewed this encounter. Orders Placed This Encounter  Procedures  . Ambulatory referral to Physical Therapy    Referral Priority:   Routine    Referral Type:   Physical Medicine    Referral Reason:   Specialty Services Required    Requested Specialty:   Physical Therapy    Number of Visits Requested:   1   No orders of the defined types were placed in this encounter.   Discussed warning signs or symptoms. Please see discharge instructions. Patient expresses understanding.   The above documentation has been reviewed and is accurate and complete Lynne Leader, M.D.

## 2020-02-22 ENCOUNTER — Ambulatory Visit: Payer: 59 | Admitting: Family Medicine

## 2020-02-27 ENCOUNTER — Other Ambulatory Visit: Payer: Self-pay

## 2020-02-27 MED ORDER — EZETIMIBE 10 MG PO TABS
10.0000 mg | ORAL_TABLET | Freq: Every day | ORAL | 3 refills | Status: DC
Start: 2020-02-27 — End: 2020-03-05

## 2020-02-27 NOTE — Telephone Encounter (Signed)
RF request for Ezetimibe LOV:02/20/20 Next ov: 06/20/20 Last written:01/23/20(30,2)  Please advise if 90 day supply appropriate for refill. Medication pending

## 2020-03-05 ENCOUNTER — Other Ambulatory Visit: Payer: Self-pay

## 2020-03-05 MED ORDER — EZETIMIBE 10 MG PO TABS
10.0000 mg | ORAL_TABLET | Freq: Every day | ORAL | 3 refills | Status: DC
Start: 2020-03-05 — End: 2020-12-16

## 2020-03-12 ENCOUNTER — Encounter: Payer: Self-pay | Admitting: Family Medicine

## 2020-04-15 NOTE — Progress Notes (Signed)
I, [name], LAT, ATC, am serving as scribe for Dr. Lynne Leader.  Rodney Watkins is a 64 y.o. male who presents to [facility] today for f/u of bilat plantar foot pain.  He was last seen by Dr. [name] on 02/21/20 for B foot pain and chronic LBP secondary to pars defects.  He was advised to use MT pads for his shoes and consider getting new inserts for his shoes.  He was also advised to attend PT for his LBP.  Since his last visit, pt reports continue bilat foot pain. Pt reports he has been going to [facility] for LBP and hamstring tightness. Pt has not been treating foot pain lately. Pt has been doing HEP that PT prescribed him. Pt c/o pain first thing in the morning, but decreases over time. Pt tried MT pads, but reports they hurt his feet. R>L. Pt states that he has been wearing old sneakers to do yard work and attributes to increased arch pain. Right is much worse than left.  Pertinent review of systems: No fevers or chills  Relevant historical information: History of prostate cancer   Exam:  BP 130/78 (BP Location: Right Arm, Patient Position: Sitting, Cuff Size: Normal)   Pulse 86   Ht 6' (1.829 m)   Wt 215 lb 9.6 oz (97.8 kg)   SpO2 97%   BMI 29.24 kg/m  General: Well Developed, well nourished, and in no acute distress.   MSK: Right foot cavus appearing. Tender palpation mid arch. Normal foot and ankle motion.    Lab and Radiology Results  X-ray images right foot obtained today personally and independently interpreted. Mild midfoot DJD. No acute fractures visible. Bipartite sesamoid Await formal radiology review    Assessment and Plan: 64 y.o. male with right midfoot arch pain ongoing for 3 months. Patient has failed conservative management including modification of footwear, home exercise program, PT, and metatarsal pads. Diagnosis at this time includes tendinopathy, stress fracture, DJD or even ganglion cyst. Discussed options.  Plan for MRI to evaluate for stress fracture and for potential injection planning or even surgical planning. Recheck after MRI.   PDMP not reviewed this encounter. Orders Placed This Encounter  Procedures  . DG Foot Complete Right    Standing Status:   Future    Number of Occurrences:   1    Standing Expiration Date:   04/16/2021    Order Specific Question:   Reason for Exam (SYMPTOM  OR DIAGNOSIS REQUIRED)    Answer:   eval foot pain    Order Specific Question:   Preferred imaging location?    Answer:   [facility]  . MR FOOT RIGHT WO CONTRAST    Standing Status:   Future    Standing Expiration Date:   04/16/2021    Order Specific Question:   What is the patient's sedation requirement?    Answer:   No Sedation    Order Specific Question:   Does the patient have a pacemaker or implanted devices?    Answer:   No    Order Specific Question:   Preferred imaging location?    Answer:   [facility] development [facility] (table limit-350lbs)   No orders of the defined types were placed in this encounter.    Discussed warning signs or symptoms. Please see discharge instructions. Patient expresses understanding.   The above documentation has been reviewed and is accurate and complete Lynne Leader, M.D.

## 2020-04-16 ENCOUNTER — Other Ambulatory Visit: Payer: Self-pay

## 2020-04-16 ENCOUNTER — Ambulatory Visit (INDEPENDENT_AMBULATORY_CARE_PROVIDER_SITE_OTHER): Payer: 59

## 2020-04-16 ENCOUNTER — Ambulatory Visit (INDEPENDENT_AMBULATORY_CARE_PROVIDER_SITE_OTHER): Payer: 59 | Admitting: Family Medicine

## 2020-04-16 VITALS — BP 130/78 | HR 86 | Ht 72.0 in | Wt 215.6 lb

## 2020-04-16 DIAGNOSIS — M79672 Pain in left foot: Secondary | ICD-10-CM

## 2020-04-16 DIAGNOSIS — M79671 Pain in right foot: Secondary | ICD-10-CM | POA: Diagnosis not present

## 2020-04-16 NOTE — Patient Instructions (Signed)
Thank you for coming in today.  Plan for xray today.   You should hear from MRI scheduling within 1 week. If you do not hear please let me know.   Recheck after MRI.

## 2020-04-17 NOTE — Progress Notes (Signed)
Moderate arthritis of the big toe.  Otherwise x-ray foot looks normal to radiology.  An MRI should be more helpful.

## 2020-04-23 ENCOUNTER — Ambulatory Visit: Payer: 59

## 2020-04-23 ENCOUNTER — Telehealth: Payer: Self-pay | Admitting: Family Medicine

## 2020-04-23 NOTE — Telephone Encounter (Signed)
Spoke w/ patient and discussed present condition of pathology. Pt reports that for about a week he has experienced increase pain and swelling in foot and ankle. After further questioning, pt reported that he has been about a week ago he stepped off a step/stool "funny," has been on his feet a lot, and has increased his walking. Pt was advised to follow Dr. Tamala Julian recommendation and ice, elevate, and rest and if the pain is severe after some appropriate treatment, to get to the ED.

## 2020-04-23 NOTE — Telephone Encounter (Signed)
Patient called following up on his MRI order. I told him that it was approved yesterday and gave him the information for [facility] [place] for scheduling.  He said that he is concerned because he has been having increased pain and swelling in his right foot. He did not know if there is something he should be doing in the mean time?  Please advise.

## 2020-04-23 NOTE — Progress Notes (Signed)
  Radiation Oncology         [phone]) [phone] ________________________________  Name: Rodney Watkins MRN: [id number]  Date: 11/03/2019  DOB: May 16, 1955  End of Treatment Note  Diagnosis:   64 y.o. gentleman with rising, detectable PSA at 0.19 s/p RALP in 07/2016 for Stage pT3b, pN0 Gleason 3+4 prostate cancer     Indication for treatment:  Curative, Definitive Radiotherapy       Radiation treatment dates:   09/12/19-11/03/19  Site/dose:  1. The prostate fossa and pelvic lymph nodes were initially treated to 45 Gy in 25 fractions of 1.8 Gy  2. The prostate fossa only was boosted to 68.4 Gy with 13 additional fractions of 1.8 Gy   Beams/energy:  1. The prostate fossa  and pelvic lymph nodes were initially treated using VMAT intensity modulated radiotherapy delivering 6 megavolt photons. Image guidance was performed with CB-CT studies prior to each fraction. He was immobilized with a body fix lower extremity mold.  2. The prostate fossa only was boosted using VMAT intensity modulated radiotherapy delivering 6 megavolt photons. Image guidance was performed with CB-CT studies prior to each fraction. He was immobilized with a body fix lower extremity mold.  Narrative: The patient tolerated radiation treatment relatively well.   The patient experienced some minor urinary irritation and modest fatigue.    Plan: The patient has completed radiation treatment. He will return to radiation oncology clinic for routine followup in one month. I advised him to call or return sooner if he has any questions or concerns related to his recovery or treatment. ________________________________  Sheral Apley. Tammi Klippel, M.D.

## 2020-04-23 NOTE — Telephone Encounter (Signed)
Please advise 

## 2020-04-23 NOTE — Telephone Encounter (Signed)
I have not seen patient some difficult to assess.  Patient can start with icing fairly regularly for 20 minutes every 4 hours and elevation.  If truly worsening pain and swelling the only other thing would be to go to the emergency room to rule out any type of clot or anything new.

## 2020-04-24 ENCOUNTER — Ambulatory Visit (INDEPENDENT_AMBULATORY_CARE_PROVIDER_SITE_OTHER): Payer: 59

## 2020-04-24 ENCOUNTER — Other Ambulatory Visit: Payer: Self-pay

## 2020-04-24 ENCOUNTER — Telehealth: Payer: Self-pay | Admitting: *Deleted

## 2020-04-24 DIAGNOSIS — Z789 Other specified health status: Secondary | ICD-10-CM | POA: Diagnosis not present

## 2020-04-24 DIAGNOSIS — E7800 Pure hypercholesterolemia, unspecified: Secondary | ICD-10-CM | POA: Diagnosis not present

## 2020-04-24 LAB — LIPID PANEL
Cholesterol: 208 mg/dL — ABNORMAL HIGH (ref 0–200)
HDL: 62.1 mg/dL (ref >39.00)
LDL Cholesterol: 130 mg/dL — ABNORMAL HIGH (ref 0–99)
NonHDL: 145.64
Total CHOL/HDL Ratio: 3
Triglycerides: 78 mg/dL (ref 0.0–149.0)
VLDL: 15.6 mg/dL (ref 0.0–40.0)

## 2020-04-24 NOTE — Telephone Encounter (Signed)
error 

## 2020-04-27 ENCOUNTER — Other Ambulatory Visit: Payer: Self-pay

## 2020-04-27 ENCOUNTER — Ambulatory Visit (INDEPENDENT_AMBULATORY_CARE_PROVIDER_SITE_OTHER): Payer: 59

## 2020-04-27 DIAGNOSIS — M79672 Pain in left foot: Secondary | ICD-10-CM

## 2020-04-27 DIAGNOSIS — R202 Paresthesia of skin: Secondary | ICD-10-CM

## 2020-04-27 DIAGNOSIS — M79671 Pain in right foot: Secondary | ICD-10-CM

## 2020-04-29 NOTE — Progress Notes (Signed)
MRI of the foot shows moderate arthritis at the big toe joint.  It additionally shows some fluid collecting deep to the big toe.  The fluid collecting is probably the source of pain.  Reasonable to try treating this with an injection.  Recommend return to clinic to go over the results in further detail and discuss potential treatment options.

## 2020-05-01 NOTE — Progress Notes (Signed)
I, Rodney Watkins, LAT, ATC, am serving as scribe for Dr. Lynne Watkins.  Rodney Watkins is a 64 y.o. male who presents to [facility] today for f/u of R foot pain and R foot MRI review.  He was last seen by Dr. [name] on 04/16/20 and noted con't B foot pain, R>L, despite conservative measures including PT, HEP and MT pads.  He was referred for a R foot MRI that he had on 04/27/20.  Since his last visit, pt reports R foot is slightly better and the ankle is swelling. Now his L foot is increasing pain. Pt feels like feet get hot, but not to the touch. Pt is going to [name] for his LBPn. After chatting w/ a friend, pt is concerned he is developing diabetes. Pt purchased a glucose test kit and blood sugar levels have been 122 and 112. Pt has made a change in diet, cutting out carb, sweets and after 2-days, now reports feeling somewhat better overall.  Diagnostic testing: R foot MRI- 04/27/20; R foot XR- 04/16/20  Pertinent review of systems: No fevers or chills  Relevant historical information: Prostate cancer   Exam:  BP 140/80 (BP Location: Right Arm, Patient Position: Sitting, Cuff Size: Normal)   Pulse 89   Ht 6' (1.829 m)   Wt 214 lb 9.6 oz (97.3 kg)   SpO2 96%   BMI 29.10 kg/m  General: Well Developed, well nourished, and in no acute distress.   MSK: Right foot mildly tender palpation plantar first MTP    Lab and Radiology Results MR FOOT RIGHT WO CONTRAST  Result Date: 04/28/2020 CLINICAL DATA:  Right foot pain, numbness and tingling for 3 months. Symptoms are worse in the morning. EXAM: MRI OF THE RIGHT FOREFOOT WITHOUT CONTRAST TECHNIQUE: Multiplanar, multisequence MR imaging of the right forefoot was performed. No intravenous contrast was administered. COMPARISON:  None. FINDINGS: Bones/Joint/Cartilage No fracture, stress change or worrisome lesion is identified. The patient has mild first MTP osteoarthritis. Bipartite medial sesamoid is seen  with marrow edema in the distal moiety from osteoarthritis. Ligaments Intact. Muscles and Tendons Intact and normal appearance. Soft tissues A fluid collection measuring 1.7 cm long by 1.2 cm transverse by 0.7 cm craniocaudal is seen in the subcutaneous tissues deep to the first MTP joint. IMPRESSION: No acute abnormality. Moderate appearing first MTP osteoarthritis. Small fluid collection in the subcutaneous tissues deep to the first MTP joint consistent with adventitial bursa formation. Electronically Signed   By: [name] M.D.   On: 04/28/2020 10:59   DG Foot Complete Right  Result Date: 04/16/2020 CLINICAL DATA:  Foot pain plantar pain EXAM: RIGHT FOOT COMPLETE - 3+ VIEW COMPARISON:  None. FINDINGS: No fracture or malalignment. Mild degenerative change at the first MTP joint. Soft tissues are unremarkable. IMPRESSION: No acute osseous abnormality. Mild degenerative change at the first MTP joint. Electronically Signed   By: [name] M.D.   On: 04/16/2020 23:44   I, Rodney Watkins, personally (independently) visualized and performed the interpretation of the images attached in this note.     Assessment and Plan: 64 y.o. male with right plantar foot pain due to the adventitial bursitis.  Discussed options.  Patient would like to avoid steroid injection with think is reasonable.  Plan on metatarsal float.  Consider custom orthotics in the future.  If not improved after a month proceed with steroid injection.  Patient wanted to talk about his blood sugar.  I  think reducing carbohydrates in general is a good idea.  That may help his foot.  Certainly weight loss should help his foot as well.   PDMP not reviewed this encounter. Orders Placed This Encounter  Procedures  . [place] LIMITED JOINT SPACE STRUCTURES LOW RIGHT(NO LINKED CHARGES)    Standing Status:   Future    Number of Occurrences:   1    Standing Expiration Date:   10/31/2020    Order Specific Question:   Reason for Exam (SYMPTOM   OR DIAGNOSIS REQUIRED)    Answer:   chronic foot pain    Order Specific Question:   Preferred imaging location?    Answer:   [place]   No orders of the defined types were placed in this encounter.    Discussed warning signs or symptoms. Please see discharge instructions. Patient expresses understanding.   The above documentation has been reviewed and is accurate and complete Rodney Watkins, M.D.

## 2020-05-02 ENCOUNTER — Ambulatory Visit (INDEPENDENT_AMBULATORY_CARE_PROVIDER_SITE_OTHER): Payer: 59 | Admitting: Family Medicine

## 2020-05-02 ENCOUNTER — Other Ambulatory Visit: Payer: Self-pay

## 2020-05-02 ENCOUNTER — Ambulatory Visit: Payer: Self-pay

## 2020-05-02 VITALS — BP 140/80 | HR 89 | Ht 72.0 in | Wt 214.6 lb

## 2020-05-02 DIAGNOSIS — M79672 Pain in left foot: Secondary | ICD-10-CM | POA: Diagnosis not present

## 2020-05-02 DIAGNOSIS — R7303 Prediabetes: Secondary | ICD-10-CM | POA: Diagnosis not present

## 2020-05-02 DIAGNOSIS — M7751 Other enthesopathy of right foot: Secondary | ICD-10-CM

## 2020-05-02 DIAGNOSIS — M79671 Pain in right foot: Secondary | ICD-10-CM

## 2020-05-02 NOTE — Patient Instructions (Addendum)
Thank you for coming in today.  The pain in your foot is coming from the bursa on the bottom of the foot seen on MRI.   Create the metaatarsal float by cutting an old insole to crease a void where the big toe joint sits and putting it under the original insole in your shoe.   Also try using voltaren gel.   Ice may help.   If not better I can do an injection.   Give it a month.

## 2020-05-09 ENCOUNTER — Telehealth: Payer: Self-pay | Admitting: Radiation Oncology

## 2020-05-09 NOTE — Telephone Encounter (Signed)
Received voicemail message from patient requesting return call. Phoned patient to inquire. Patient explains he began having bilateral foot pain 1 month s/p radiation. Patient explains his PCP evaluated him then referred him to sports medication. Patient reports sports medicine did an MRI that revealed fluid in his foot. Patient reports somedays the pain in his feet is so intense he can't walk. Patient describes the pain in his feet as burning, numbness, and aching. Patient reports his feet are also swollen. Reminded the patient he was seeing a provider for his back prior to finding out he had prostate cancer. Questioned patient if the burning and numbness in his feet could be related to his previous back issue. Encouraged him to follow up with his PCP reference lack of improvement in symptoms and thoughts on seeing his back doctor again. Patient verbalized understanding and confirmed he would contact his PCP again.

## 2020-05-15 ENCOUNTER — Ambulatory Visit: Payer: 59 | Admitting: Family Medicine

## 2020-05-21 ENCOUNTER — Other Ambulatory Visit: Payer: Self-pay

## 2020-05-22 ENCOUNTER — Telehealth: Payer: Self-pay | Admitting: Family Medicine

## 2020-05-22 NOTE — Telephone Encounter (Signed)
Patient has appt scheduled tomorrow, 05/23/20, for feet and leg swelling. He states he had positive home Covid test 05/07/20 with symptoms and continues to have sinus congestion, fatigue and headaches. Please advise if ok for patient to come in office for his appointment 05/23/20.

## 2020-05-23 ENCOUNTER — Telehealth (INDEPENDENT_AMBULATORY_CARE_PROVIDER_SITE_OTHER): Payer: 59 | Admitting: Family Medicine

## 2020-05-23 ENCOUNTER — Encounter: Payer: Self-pay | Admitting: Family Medicine

## 2020-05-23 ENCOUNTER — Ambulatory Visit: Payer: 59 | Admitting: Family Medicine

## 2020-05-23 VITALS — Wt 207.0 lb

## 2020-05-23 DIAGNOSIS — J019 Acute sinusitis, unspecified: Secondary | ICD-10-CM | POA: Diagnosis not present

## 2020-05-23 DIAGNOSIS — M79671 Pain in right foot: Secondary | ICD-10-CM

## 2020-05-23 DIAGNOSIS — U071 COVID-19: Secondary | ICD-10-CM | POA: Diagnosis not present

## 2020-05-23 DIAGNOSIS — M79672 Pain in left foot: Secondary | ICD-10-CM

## 2020-05-23 DIAGNOSIS — G8929 Other chronic pain: Secondary | ICD-10-CM

## 2020-05-23 MED ORDER — AMOXICILLIN 875 MG PO TABS: 875.0000 mg | ORAL_TABLET | Freq: Two times a day (BID) | ORAL | 0 refills | Status: AC

## 2020-05-23 NOTE — Progress Notes (Deleted)
OFFICE VISIT  05/23/2020  CC: No chief complaint on file.   HPI:    Patient is a 65 y.o. Caucasian male who presents for feet and legs swelling.  Past Medical History:  Diagnosis Date  . Atypical chest pain 05/2019   Insurance denied stress test I ordered.  Pt chose watchful waiting approach at that time.  . CTS (carpal tunnel syndrome)    bilat (NCS/EMG - confirmed 06/12/18)  . Diastasis of rectus abdominis   . Erectile dysfunction    sildenafil prn helpful  . History of kidney stones    bladder  . Hyperlipidemia    Intol of atorva and prava.  . IFG (impaired fasting glucose)    Per old records.  A1c 5.7% 05/2017.  A1c 5.3% 10/2017.  . Incisional hernia without obstruction or gangrene 05/2017  . Lumbar spondylosis    w/pars defect and listhesis (Dr. Saintclair Halsted).  Referred by neurosurg to pain mgmt MD and pt got ESI x 2 ---no help.  . Paresthesias    UEs and LE's 2019---recommended neuro referral and pt wanted to think about it.  Then, Dr. Posey Pronto evaluated him 05/2018 and ordered NCS/EMGs to further eval feet paresthesias.  . Prostate cancer [facility])    Locally advanced prostate ca 06/2016.  Prostatectomy  RAL radical prostatectomy 07/2016.  Biochemical recurrence 2020/2021--salvage RT+ ADT. Undect PSA 02/2020.  . Pulmonary nodule 2009   Stable CT chest 2010--no further scans needed in pt at low risk for lung ca.  . 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    Past Surgical History:  Procedure Laterality Date  . COLONOSCOPY  04/20/11   Normal: recall 10 yrs  . ett  approx 2010   normal  . KNEE ARTHROSCOPY  2000   left  . LYMPHADENECTOMY Bilateral 08/06/2016   Procedure: PELVIC LYMPHADENECTOMY;  Surgeon: Raynelle Bring, MD;  Location: [facility];  Service: Urology;  Laterality: Bilateral;  . NCV & EMG  06/02/2018; 06/14/2018   07/03/2018 (LEGS)->NORMAL.  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  Marland Kitchen REFRACTIVE SURGERY  2000   both eyes  . ROBOT ASSISTED LAPAROSCOPIC RADICAL PROSTATECTOMY N/A 08/06/2016   Procedure: Path: adenocarcinoma with extraprostatic extension present, negative margins, lymph nodes NEG. XI ROBOTIC ASSISTED LAPAROSCOPIC RADICAL PROSTATECTOMY LEVEL 2;  Surgeon: Raynelle Bring, MD;  Location: [facility];  Service: Urology;  Laterality: N/A;  . ROTATOR CUFF REPAIR  2002   right  . ROTATOR CUFF REPAIR  2003   left    Outpatient Medications Prior to Visit  Medication Sig Dispense Refill  . cetirizine (ZYRTEC) 10 MG tablet Take 10 mg by mouth daily as needed for allergies.     Marland Kitchen ezetimibe (ZETIA) 10 MG tablet Take 1 tablet (10 mg total) by mouth daily. 90 tablet 3  . Multiple Vitamin (MULTIVITAMIN) tablet Take 1 tablet by mouth daily.    . sildenafil (VIAGRA) 100 MG tablet Take by mouth at bedtime.     No facility-administered medications prior to visit.    Allergies  Allergen Reactions  . Atorvastatin Other (See Comments)    Myalgias/arthralgias    ROS As per HPI  PE: Vitals with BMI 05/02/2020 04/16/2020 02/21/2020  Height 6\' 0"  6\' 0"  6\' 0"   Weight 214 lbs 10 oz 215 lbs 10 oz 210 lbs 3 oz  BMI 29.1 70.96 28.3  Systolic 662 947 654  Diastolic 80 78 74  Pulse 89 86 89     ***  LABS:  Lab Results  Component Value Date   TSH 1.33 12/13/2018   Lab Results  Component Value Date   WBC 6.3 05/04/2019   HGB 15.4 05/04/2019   HCT 44.8 05/04/2019   MCV 91.4 05/04/2019   PLT 285 05/04/2019   Lab Results  Component Value Date   CREATININE 0.89 12/19/2019   BUN 19 12/19/2019   NA 139 12/19/2019   K 4.8 12/19/2019   CL 105 12/19/2019   CO2 27 12/19/2019   Lab Results  Component Value Date   ALT 38 12/19/2019   AST 25 12/19/2019   ALKPHOS 60 01/06/2017   BILITOT 0.7 12/19/2019   Lab Results  Component Value Date   CHOL 208 (H) 04/24/2020   Lab Results  Component Value Date   HDL 62.10 04/24/2020   Lab Results   Component Value Date   LDLCALC 130 (H) 04/24/2020   Lab Results  Component Value Date   TRIG 78.0 04/24/2020   Lab Results  Component Value Date   CHOLHDL 3 04/24/2020   Lab Results  Component Value Date   PSA 0.067 01/23/2019   PSA 0.033 06/15/2018   Lab Results  Component Value Date   HGBA1C 5.6 05/16/2019   IMPRESSION AND PLAN:  No problem-specific Assessment & Plan notes found for this encounter.   An After Visit Summary was printed and given to the patient.  FOLLOW UP: No follow-ups on file.  Signed:  Crissie Sickles, MD           05/23/2020

## 2020-05-23 NOTE — Progress Notes (Signed)
Virtual Visit via Video Note  I connected with pt on 05/23/20 at 11:30 AM EST by a video enabled telemedicine application and verified that I am speaking with the correct person using two identifiers.  Location patient: home, [place] Location provider:work or home office Persons participating in the virtual visit: patient, provider  I discussed the limitations of evaluation and management by telemedicine and the availability of in person appointments. The patient expressed understanding and agreed to proceed.  Telemedicine visit is a necessity given the COVID-19 restrictions in place at the current time.  HPI: 65 y/o WM being seen today for ongoing symptoms after having covid + 05/07/20. Around 05/06/20 he started to get nasal cong/runny nose, cough, HA, fatigue.  Pos home covid test 05/07/20. Still very tired, HA--pressure behind eyes and some facial pain, nasal/sinus congestion, metallic taste in mouth, a little diarrhea.  Nasal   Never had fever.  No n/v.  No wheezing or SOB or CP.  Taking advil, tylenol, cold med.  Saline via neti pot.   ROS: See pertinent positives and negatives per HPI.  Past Medical History:  Diagnosis Date  . Atypical chest pain 05/2019   Insurance denied stress test I ordered.  Pt chose watchful waiting approach at that time.  . CTS (carpal tunnel syndrome)    bilat (NCS/EMG - confirmed 06/12/18)  . Diastasis of rectus abdominis   . Erectile dysfunction    sildenafil prn helpful  . History of kidney stones    bladder  . Hyperlipidemia    Intol of atorva and prava.  . IFG (impaired fasting glucose)    Per old records.  A1c 5.7% 05/2017.  A1c 5.3% 10/2017.  . Incisional hernia without obstruction or gangrene 05/2017  . Lumbar spondylosis    w/pars defect and listhesis (Dr. Saintclair Halsted).  Referred by neurosurg to pain mgmt MD and pt got ESI x 2 ---no help.  . 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 Paresthesias    UEs and LE's 2019---recommended neuro referral and pt  wanted to think about it.  Then, Dr. Posey Pronto evaluated him 05/2018 and ordered NCS/EMGs to further eval feet paresthesias.  . Prostate cancer [facility])    Locally advanced prostate ca 06/2016.  Prostatectomy  RAL radical prostatectomy 07/2016.  Biochemical recurrence 2020/2021--salvage RT+ ADT. Undect PSA 02/2020.  . Pulmonary nodule 2009   Stable CT chest 2010--no further scans needed in pt at low risk for lung ca.  . 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    Past Surgical History:  Procedure Laterality Date  . COLONOSCOPY  04/20/11   Normal: recall 10 yrs  . ett  approx 2010   normal  . KNEE ARTHROSCOPY  2000   left  . LYMPHADENECTOMY Bilateral 08/06/2016   Procedure: PELVIC LYMPHADENECTOMY;  Surgeon: Raynelle Bring, MD;  Location: [facility];  Service: Urology;  Laterality: Bilateral;  . NCV & EMG  06/02/2018; 06/14/2018   07/03/2018 (LEGS)->NORMAL.  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  Marland Kitchen REFRACTIVE SURGERY  2000   both eyes  . ROBOT ASSISTED LAPAROSCOPIC RADICAL PROSTATECTOMY N/A 08/06/2016   Procedure: Path: adenocarcinoma with extraprostatic extension present, negative margins, lymph nodes NEG. XI ROBOTIC ASSISTED LAPAROSCOPIC RADICAL PROSTATECTOMY LEVEL 2;  Surgeon: Raynelle Bring, MD;  Location: [facility];  Service: Urology;  Laterality: N/A;  . ROTATOR CUFF REPAIR  2002   right  . ROTATOR  CUFF REPAIR  2003   left     Current Outpatient Medications:  .  ezetimibe (ZETIA) 10 MG tablet, Take 1 tablet (10 mg total) by mouth daily., Disp: 90 tablet, Rfl: 3 .  Multiple Vitamin (MULTIVITAMIN) tablet, Take 1 tablet by mouth daily., Disp: , Rfl:  .  sildenafil (VIAGRA) 100 MG tablet, Take by mouth at bedtime., Disp: , Rfl:  .  cetirizine (ZYRTEC) 10 MG tablet, Take 10 mg by mouth daily as needed for allergies.  (Patient not taking: Reported on 05/23/2020), Disp: , Rfl:    EXAM:  VITALS per patient if applicable:  Vitals with BMI 05/23/2020 05/02/2020 04/16/2020  Height - 6\' 0"  6\' 0"   Weight 207 lbs 214 lbs 10 oz 215 lbs 10 oz  BMI 28.07 03.5 00.93  Systolic - 818 299  Diastolic - 80 78  Pulse - 89 86     GENERAL: alert, oriented, appears well and in no acute distress  HEENT: atraumatic, conjunttiva clear, no obvious abnormalities on inspection of external nose and ears  NECK: normal movements of the head and neck  LUNGS: on inspection no signs of respiratory distress, breathing rate appears normal, no obvious gross SOB, gasping or wheezing  CV: no obvious cyanosis  MS: moves all visible extremities without noticeable abnormality  PSYCH/NEURO: pleasant and cooperative, no obvious depression or anxiety, speech and thought processing grossly intact  LABS: none today    Chemistry      Component Value Date/Time   NA 139 12/19/2019 1102   NA 140 01/06/2017 1535   K 4.8 12/19/2019 1102   CL 105 12/19/2019 1102   CO2 27 12/19/2019 1102   BUN 19 12/19/2019 1102   BUN 13 01/06/2017 1535   CREATININE 0.89 12/19/2019 1102   GLU 120 01/06/2017 1535      Component Value Date/Time   CALCIUM 10.0 12/19/2019 1102   ALKPHOS 60 01/06/2017 1535   AST 25 12/19/2019 1102   ALT 38 12/19/2019 1102   BILITOT 0.7 12/19/2019 1102     Lab Results  Component Value Date   WBC 6.3 05/04/2019   HGB 15.4 05/04/2019   HCT 44.8 05/04/2019   MCV 91.4 05/04/2019   PLT 285 05/04/2019   Lab Results  Component Value Date   HGBA1C 5.6 05/16/2019   ASSESSMENT AND PLAN:  Discussed the following assessment and plan:  1) Covid 19 infection, onset of sx's/positive test approx 17 days ago, now with possible acute bacterial sinusitis complication. Also having significantly slow resolution of other sx's, primarily weakness/fatigue.  2) Feet pain:  he was originally supposed to be seen for f/u feet pain today and he did tell me an update: for about the last 3 mo  has had significant numbness and burning/hot sensation in both feet constantly. I discussed the assessment and treatment plan with the patient. The patient was provided an opportunity to ask questions and all were answered. The patient agreed with the plan and demonstrated an understanding of the instructions.   F/u: keep appt set for 2 wks for f/u feet  Signed:  Crissie Sickles, MD           05/23/2020

## 2020-06-05 ENCOUNTER — Ambulatory Visit: Payer: 59 | Admitting: Family Medicine

## 2020-06-06 ENCOUNTER — Ambulatory Visit: Payer: 59 | Admitting: Family Medicine

## 2020-06-11 NOTE — Progress Notes (Signed)
° °  I, [name], LAT, ATC, am serving as scribe for Dr. Lynne Leader.  Rodney Watkins is a 65 y.o. male who presents to [facility] today for f/u of B foot pain, R>L.  He was last seen by Dr. [name] on 05/02/20 and was advise to adapt his shoe inserts to decrease pressure at the painful areas on his plantar feet.  He has been doing PT for his LBP and has been dc.  Since his last visit, pt reports feet pain is about the same. Pt notes about 4 weeks ago he was experiencing a burning sensation and numbness/tinling in bilat feet. This burning pain went on constantly for about 3 weeks. Currently, burning sensation isn't as severe, but he is still experiencing the pain.  Diagnostic testing: R foot MRI- 04/27/20; R foot XR- 04/16/20  Pertinent review of systems: No fevers or chills  Relevant historical information: History of prostate cancer with prostatectomy and radiation therapy to the pelvis.   Exam:  BP 120/76 (BP Location: Right Arm, Patient Position: Sitting, Cuff Size: Normal)    Pulse 91    Ht 6' (1.829 m)    Wt 211 lb 3.2 oz (95.8 kg)    SpO2 97%    BMI 28.64 kg/m  General: Well Developed, well nourished, and in no acute distress.   MSK: Right foot nontender to palpation.  Decreased sensation to light touch.    Assessment and Plan: 65 y.o. male with bilateral foot paresthesias and pain.  This is more consistent with neuropathy.  Patient has several potential causes of neuropathy.  He has had external beam radiation therapy to his pelvis and certainly could have nerve damage as result of that.  Additionally has a pars defect in his lumbar spine and could have nerve compression or he could have a different cause of peripheral neuropathy.  I do not think the pain today is due to the adventitial bursitis that was seen on MRI of his foot previously.  Plan for nerve conduction study and recheck after nerve conduction study.   PDMP not reviewed this  encounter. Orders Placed This Encounter  Procedures   Ambulatory referral to Neurology    Referral Priority:   Routine    Referral Type:   Consultation    Referral Reason:   Specialty Services Required    Requested Specialty:   Neurology    Number of Visits Requested:   1   NCV with EMG(electromyography)    Standing Status:   Future    Standing Expiration Date:   06/12/2021    Order Specific Question:   Where should this test be performed?    Answer:   [facility]   No orders of the defined types were placed in this encounter.    Discussed warning signs or symptoms. Please see discharge instructions. Patient expresses understanding.   The above documentation has been reviewed and is accurate and complete Lynne Leader, M.D.

## 2020-06-12 ENCOUNTER — Ambulatory Visit: Payer: Self-pay

## 2020-06-12 ENCOUNTER — Other Ambulatory Visit: Payer: Self-pay

## 2020-06-12 ENCOUNTER — Ambulatory Visit (INDEPENDENT_AMBULATORY_CARE_PROVIDER_SITE_OTHER): Payer: 59 | Admitting: Family Medicine

## 2020-06-12 VITALS — BP 120/76 | HR 91 | Ht 72.0 in | Wt 211.2 lb

## 2020-06-12 DIAGNOSIS — M7751 Other enthesopathy of right foot: Secondary | ICD-10-CM | POA: Diagnosis not present

## 2020-06-12 DIAGNOSIS — M79672 Pain in left foot: Secondary | ICD-10-CM | POA: Diagnosis not present

## 2020-06-12 DIAGNOSIS — M79671 Pain in right foot: Secondary | ICD-10-CM | POA: Diagnosis not present

## 2020-06-12 DIAGNOSIS — R202 Paresthesia of skin: Secondary | ICD-10-CM | POA: Diagnosis not present

## 2020-06-12 NOTE — Patient Instructions (Addendum)
Thank you for coming in today.  Plan for nerve study.   Recheck about 2 weeks after the nerve study.

## 2020-06-20 ENCOUNTER — Encounter: Payer: Self-pay | Admitting: Family Medicine

## 2020-06-20 ENCOUNTER — Other Ambulatory Visit: Payer: Self-pay

## 2020-06-20 ENCOUNTER — Ambulatory Visit (INDEPENDENT_AMBULATORY_CARE_PROVIDER_SITE_OTHER): Payer: 59 | Admitting: Family Medicine

## 2020-06-20 VITALS — BP 109/71 | HR 81 | Temp 97.8°F | Resp 16 | Ht 72.0 in | Wt 208.8 lb

## 2020-06-20 DIAGNOSIS — R7301 Impaired fasting glucose: Secondary | ICD-10-CM

## 2020-06-20 DIAGNOSIS — Z0000 Encounter for general adult medical examination without abnormal findings: Secondary | ICD-10-CM

## 2020-06-20 DIAGNOSIS — E7800 Pure hypercholesterolemia, unspecified: Secondary | ICD-10-CM | POA: Diagnosis not present

## 2020-06-20 LAB — HEMOGLOBIN A1C: Hgb A1c MFr Bld: 5.9 % (ref 4.6–6.5)

## 2020-06-20 NOTE — Progress Notes (Signed)
Office Note 06/20/2020  CC:  Chief Complaint  Patient presents with  . Annual Exam    Pt is fasting    HPI:  Rodney Watkins is a 65 y.o. White male who is here for annual health maintenance exam.  Took about a month to get over covid infection a couple months ago.  Starting to get more active as weather improves. Taking zetia 10mg  qd w/out side effects for the last 7 mo---lipid check 2 mo ago showed no signif change in LDL, though. Has cut back on some sugars and wine intake.  Dealing with chronic neuropathic feet pain. Dr. [name] has seen him for his various musculoskeletal/neuropathic pain complaints.   Past Medical History:  Diagnosis Date  . Atypical chest pain 05/2019   Insurance denied stress test I ordered.  Pt chose watchful waiting approach at that time.  . CTS (carpal tunnel syndrome)    bilat (NCS/EMG - confirmed 06/12/18)  . Diastasis of rectus abdominis   . Erectile dysfunction    sildenafil prn helpful  . History of kidney stones    bladder  . Hyperlipidemia    Intol of atorva and prava.  . IFG (impaired fasting glucose)    Per old records.  A1c 5.7% 05/2017.  A1c 5.3% 10/2017.  . Incisional hernia without obstruction or gangrene 05/2017  . Lumbar spondylosis    w/pars defect and listhesis (Dr. Saintclair Halsted).  Referred by neurosurg to pain mgmt MD and pt got ESI x 2 ---no help.  . 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 Paresthesias    UEs and LE's 2019---recommended neuro referral and pt wanted to think about it.  Then, Dr. Posey Pronto evaluated him 05/2018 and ordered NCS/EMGs to further eval feet paresthesias.  . Prostate cancer [facility])    Locally advanced prostate ca 06/2016.  Prostatectomy  RAL radical prostatectomy 07/2016.  Biochemical recurrence 2020/2021--salvage RT+ ADT. Undect PSA 02/2020.  . Pulmonary nodule 2009   Stable CT chest 2010--no further scans needed in pt at low risk for lung ca.  . Right thigh pain 2019/2020   medial thigh pain-->suspected to  be obturator nerve injury from pelvic LN dissection for prostate ca surgery.   Past Surgical History:  Procedure Laterality Date  . COLONOSCOPY  04/20/11   Normal: recall 10 yrs  . ett  approx 2010   normal  . KNEE ARTHROSCOPY  2000   left  . LYMPHADENECTOMY Bilateral 08/06/2016   Procedure: PELVIC LYMPHADENECTOMY;  Surgeon: Raynelle Bring, MD;  [facility];  Service: Urology;  Laterality: Bilateral;  . NCV & EMG  06/02/2018; 06/14/2018   07/03/2018 (LEGS)->NORMAL.  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  Marland Kitchen REFRACTIVE SURGERY  2000   both eyes  . ROBOT ASSISTED LAPAROSCOPIC RADICAL PROSTATECTOMY N/A 08/06/2016   Procedure: Path: adenocarcinoma with extraprostatic extension present, negative margins, lymph nodes NEG. XI ROBOTIC ASSISTED LAPAROSCOPIC RADICAL PROSTATECTOMY LEVEL 2;  Surgeon: Raynelle Bring, MD;  Location[facility];  Service: Urology;  Laterality: N/A;  . ROTATOR CUFF REPAIR  2002   right  . ROTATOR CUFF REPAIR  2003   left    Family History  Problem Relation Age of Onset  . Diabetes Father   . Ovarian cancer Sister   . Colon cancer Neg Hx   . Stomach cancer Neg Hx   . Prostate cancer Neg Hx   . Breast cancer Neg Hx     Social History   Socioeconomic History  .  Marital status: Married    Spouse name: Rise Paganini  . Number of children: 3  . Years of education: Not on file  . Highest education level: Bachelor's degree (e.g., BA, AB, BS)  Occupational History  . Occupation: FIRE DEPARTMENT    Employer: [employer]    Comment: Retired  Tobacco Use  . Smoking status: Never Smoker  . Smokeless tobacco: Never Used  Vaping Use  . Vaping Use: Never used  Substance and Sexual Activity  . Alcohol use: Yes    Alcohol/week: 3.0 standard drinks    Types: 3 Cans of beer per week  . Drug use: No  . Sexual activity: Yes  Other Topics Concern  . Not on file  Social History Narrative   Married, 3 daughters.   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   No T/A/Ds.         Pt is right-handed. He lives with his wife and three daughters in a two level home. Prior to prostate cancer surgery in 2018, he exercised regularly. He walks now, weather permitting.   Social Determinants of Health   Financial Resource Strain: Not on file  Food Insecurity: Not on file  Transportation Needs: Not on file  Physical Activity: Not on file  Stress: Not on file  Social Connections: Not on file  Intimate Partner Violence: Not on file    Outpatient Medications Prior to Visit  Medication Sig Dispense Refill  . ezetimibe (ZETIA) 10 MG tablet Take 1 tablet (10 mg total) by mouth daily. 90 tablet 3  . Multiple Vitamin (MULTIVITAMIN) tablet Take 1 tablet by mouth daily.    . sildenafil (VIAGRA) 100 MG tablet Take by mouth at bedtime.    . cetirizine (ZYRTEC) 10 MG tablet Take 10 mg by mouth daily as needed for allergies.  (Patient not taking: No sig reported)     No facility-administered medications prior to visit.    Allergies  Allergen Reactions  . Atorvastatin Other (See Comments)    Myalgias/arthralgias    ROS Review of Systems  Constitutional: Negative for appetite change, chills, fatigue and fever.  HENT: Negative for congestion, dental problem, ear pain and sore throat.   Eyes: Negative for discharge, redness and visual disturbance.  Respiratory: Negative for cough, chest tightness, shortness of breath and wheezing.   Cardiovascular: Negative for chest pain, palpitations and leg swelling.  Gastrointestinal: Negative for abdominal pain, blood in stool, diarrhea, nausea and vomiting.  Genitourinary: Negative for difficulty urinating, dysuria, flank pain, frequency, hematuria and urgency.  Musculoskeletal: Positive for back pain (chronic). Negative for arthralgias, joint swelling, myalgias and neck stiffness.  Skin: Negative for pallor and rash.  Neurological:  Negative for dizziness, speech difficulty, weakness and headaches.  Hematological: Negative for adenopathy. Does not bruise/bleed easily.  Psychiatric/Behavioral: Negative for confusion and sleep disturbance. The patient is not nervous/anxious.     PE; Vitals with BMI 06/20/2020 06/12/2020 05/23/2020  Height 6\' 0"  6\' 0"  -  Weight 208 lbs 13 oz 211 lbs 3 oz 207 lbs  BMI 28.31 35.46 56.81  Systolic 275 170 -  Diastolic 71 76 -  Pulse 81 91 -     Gen: Alert, well appearing.  Patient is oriented to person, place, time, and situation. AFFECT: pleasant, lucid thought and speech. ENT: Ears: EACs clear, normal epithelium.  TMs with good light reflex and landmarks bilaterally.  Eyes: no injection, icteris, swelling, or  exudate.  EOMI, PERRLA. Nose: no drainage or turbinate edema/swelling.  No injection or focal lesion.  Mouth: lips without lesion/swelling.  Oral mucosa pink and moist.  Dentition intact and without obvious caries or gingival swelling.  Oropharynx without erythema, exudate, or swelling.  Neck: supple/nontender.  No LAD, mass, or TM.  Carotid pulses 2+ bilaterally, without bruits. CV: RRR, no m/r/g.   LUNGS: CTA bilat, nonlabored resps, good aeration in all lung fields. ABD: soft, NT, ND, BS normal.  No hepatospenomegaly or mass.  No bruits. EXT: no clubbing, cyanosis, or edema.  Musculoskeletal: no joint swelling, erythema, warmth, or tenderness.  ROM of all joints intact. Skin - no sores or suspicious lesions or rashes or color changes   Pertinent labs:  Lab Results  Component Value Date   TSH 1.33 12/13/2018   Lab Results  Component Value Date   WBC 6.3 05/04/2019   HGB 15.4 05/04/2019   HCT 44.8 05/04/2019   MCV 91.4 05/04/2019   PLT 285 05/04/2019   Lab Results  Component Value Date   CREATININE 0.89 12/19/2019   BUN 19 12/19/2019   NA 139 12/19/2019   K 4.8 12/19/2019   CL 105 12/19/2019   CO2 27 12/19/2019   Lab Results  Component Value Date   ALT 38  12/19/2019   AST 25 12/19/2019   ALKPHOS 60 01/06/2017   BILITOT 0.7 12/19/2019   Lab Results  Component Value Date   CHOL 208 (H) 04/24/2020   Lab Results  Component Value Date   HDL 62.10 04/24/2020   Lab Results  Component Value Date   LDLCALC 130 (H) 04/24/2020   Lab Results  Component Value Date   TRIG 78.0 04/24/2020   Lab Results  Component Value Date   CHOLHDL 3 04/24/2020   Lab Results  Component Value Date   PSA 0.067 01/23/2019   PSA 0.033 06/15/2018   Lab Results  Component Value Date   HGBA1C 5.6 05/16/2019   ASSESSMENT AND PLAN:   1) HLD: intol of atorva and prava.  Recent trial of zetia did not result in any signif change in LDL (04/24/20). Cont zetia, check lipids today and if LDL is up more then I'll add 5mg  rosuva qod. Admits he needs to cont working on diet + exercise.  2) Health maintenance exam: Reviewed age and gender appropriate health maintenance issues (prudent diet, regular exercise, health risks of tobacco and excessive alcohol, use of seatbelts, fire alarms in home, use of sunscreen).  Also reviewed age and gender appropriate health screening as well as vaccine recommendations. Vaccines: Pt declines covid and flu vaccines.  Tdap and shingrix UTD. Labs: cbc, cmet, a1c. Prostate ca screening: hx of prostate ca->followed closely by urology. Colon ca screening: due for rpt colonoscopy as of now (06/2020).  3) Chronic pain: low back/spondylosis, hx of various leg/feet pains (some sound neuropathic, some sound like fasciitis/bursitis/metatarsalgia). Hx of seeing back surgeon as well as pain mgmt specialist in the past--no surg but ESI's were tried, hard to tell if helped.   Most recently has worked with Dr. [name] in [place]. He wants to get back with a back doctor and I encouraged him to do this.  An After Visit Summary was printed and given to the patient.  FOLLOW UP:  Return in about 6 months (around 12/18/2020) for routine chronic  illness f/u.  Signed:  Crissie Sickles, MD           06/20/2020

## 2020-06-20 NOTE — Patient Instructions (Signed)

## 2020-06-21 ENCOUNTER — Encounter: Payer: Self-pay | Admitting: Family Medicine

## 2020-06-21 LAB — LIPID PANEL
Cholesterol: 183 mg/dL (ref <200)
HDL: 53 mg/dL (ref >=40)
LDL Cholesterol (Calc): 114 mg/dL (calc) — ABNORMAL HIGH
Non-HDL Cholesterol (Calc): 130 mg/dL (calc) — ABNORMAL HIGH (ref <130)
Total CHOL/HDL Ratio: 3.5 (calc) (ref <5.0)
Triglycerides: 70 mg/dL (ref <150)

## 2020-06-21 LAB — CBC WITH DIFFERENTIAL/PLATELET
Absolute Monocytes: 686 cells/uL (ref 200–950)
Basophils Absolute: 38 cells/uL (ref 0–200)
Basophils Relative: 0.7 %
Eosinophils Absolute: 119 cells/uL (ref 15–500)
Eosinophils Relative: 2.2 %
HCT: 39.8 % (ref 38.5–50.0)
Hemoglobin: 13.8 g/dL (ref 13.2–17.1)
Lymphs Abs: 616 cells/uL — ABNORMAL LOW (ref 850–3900)
MCH: 31.2 pg (ref 27.0–33.0)
MCHC: 34.7 g/dL (ref 32.0–36.0)
MCV: 89.8 fL (ref 80.0–100.0)
MPV: 10.4 fL (ref 7.5–12.5)
Monocytes Relative: 12.7 %
Neutro Abs: 3942 cells/uL (ref 1500–7800)
Neutrophils Relative %: 73 %
Platelets: 287 10*3/uL (ref 140–400)
RBC: 4.43 10*6/uL (ref 4.20–5.80)
RDW: 14.4 % (ref 11.0–15.0)
Total Lymphocyte: 11.4 %
WBC: 5.4 10*3/uL (ref 3.8–10.8)

## 2020-06-21 LAB — COMPREHENSIVE METABOLIC PANEL
AG Ratio: 2.1 (calc) (ref 1.0–2.5)
ALT: 23 U/L (ref 9–46)
AST: 18 U/L (ref 10–35)
Albumin: 4.4 g/dL (ref 3.6–5.1)
Alkaline phosphatase (APISO): 60 U/L (ref 35–144)
BUN: 17 mg/dL (ref 7–25)
CO2: 26 mmol/L (ref 20–32)
Calcium: 10.3 mg/dL (ref 8.6–10.3)
Chloride: 107 mmol/L (ref 98–110)
Creat: 0.89 mg/dL (ref 0.70–1.25)
Globulin: 2.1 g/dL (calc) (ref 1.9–3.7)
Glucose, Bld: 103 mg/dL — ABNORMAL HIGH (ref 65–99)
Potassium: 4.5 mmol/L (ref 3.5–5.3)
Sodium: 141 mmol/L (ref 135–146)
Total Bilirubin: 0.6 mg/dL (ref 0.2–1.2)
Total Protein: 6.5 g/dL (ref 6.1–8.1)

## 2020-07-17 ENCOUNTER — Other Ambulatory Visit: Payer: Self-pay

## 2020-07-17 ENCOUNTER — Ambulatory Visit (INDEPENDENT_AMBULATORY_CARE_PROVIDER_SITE_OTHER): Payer: 59 | Admitting: Medical

## 2020-07-17 VITALS — BP 138/70 | HR 86 | Temp 98.0°F | Resp 18 | Ht 72.0 in | Wt 215.8 lb

## 2020-07-17 DIAGNOSIS — W57XXXA Bitten or stung by nonvenomous insect and other nonvenomous arthropods, initial encounter: Secondary | ICD-10-CM

## 2020-07-17 DIAGNOSIS — S30860A Insect bite (nonvenomous) of lower back and pelvis, initial encounter: Secondary | ICD-10-CM | POA: Diagnosis not present

## 2020-07-17 MED ORDER — DOXYCYCLINE HYCLATE 100 MG PO TABS: 100.0000 mg | ORAL_TABLET | Freq: Two times a day (BID) | ORAL | 0 refills | Status: DC

## 2020-07-17 NOTE — Patient Instructions (Signed)
Various tick bites over past 3-4 days. With as many tick bites you had recommend going ahead and starting doxycycline oral antibiotics. Potential early skin infection on some of tick bite sites. Since starting antibiotics decided not to do tick bite antibody studies.   If any of bite areas worsen or change let us know.  Follow up as regularly scheduled or as needed.

## 2020-07-17 NOTE — Progress Notes (Signed)
Subjective:    Patient ID: Rodney Watkins, male    DOB: [birth year], 65 y.o.   MRN: [id number]  HPI Pt in for recent tick bit. This past weekend he did fair amount of yard work. Pt states that he got about 7 tick bite. 6came off easy. Pt thinks one might be under the skin. Also has area on left thigh that he wants.  Found one tick on his scrotum this morning.   Review of Systems  Constitutional: Negative for chills, diaphoresis, fatigue and fever.  HENT: Negative for congestion and drooling.   Respiratory: Negative for cough, chest tightness, shortness of breath and wheezing.   Cardiovascular: Negative for chest pain and palpitations.  Gastrointestinal: Negative for abdominal pain.  Musculoskeletal: Negative for back pain and neck pain.  Skin: Negative.  Negative for rash.  Neurological: Negative for dizziness, seizures, syncope, weakness, light-headedness and headaches.  Hematological: Negative for adenopathy. Does not bruise/bleed easily.  Psychiatric/Behavioral: Negative for behavioral problems and confusion.     Past Medical History:  Diagnosis Date  . Atypical chest pain 05/2019   Insurance denied stress test I ordered.  Pt chose watchful waiting approach at that time.  . CTS (carpal tunnel syndrome)    bilat (NCS/EMG - confirmed 06/12/18)  . Diastasis of rectus abdominis   . Erectile dysfunction    sildenafil prn helpful  . History of kidney stones    bladder  . Hyperlipidemia    Intol of atorva and prava.  . IFG (impaired fasting glucose)    Per old records.  A1c 5.7% 05/2017.  A1c 5.3% 10/2017. A1c 5.9% 06/2020.  . Incisional hernia without obstruction or gangrene 05/2017  . Lumbar spondylosis    w/pars defect and listhesis (Dr. Saintclair Halsted).  Referred by neurosurg to pain mgmt MD and pt got ESI x 2 ---no help.  . 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 Paresthesias    UEs and LE's 2019---recommended neuro referral and pt wanted to think about it.  Then, Dr. Posey Pronto  evaluated him 05/2018 and ordered NCS/EMGs to further eval feet paresthesias.  . Prostate cancer [facility])    Locally advanced prostate ca 06/2016.  Prostatectomy  RAL radical prostatectomy 07/2016.  Biochemical recurrence 2020/2021--salvage RT+ ADT. Undect PSA 02/2020.  . Pulmonary nodule 2009   Stable CT chest 2010--no further scans needed in pt at low risk for lung ca.  . 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     Social History   Socioeconomic History  . Marital status: Married    Spouse name: Rise Paganini  . Number of children: 3  . Years of education: Not on file  . Highest education level: Bachelor's degree (e.g., BA, AB, BS)  Occupational History  . Occupation: FIRE DEPARTMENT    Employer: [employer]    Comment: Retired  Tobacco Use  . Smoking status: Never Smoker  . Smokeless tobacco: Never Used  Vaping Use  . Vaping Use: Never used  Substance and Sexual Activity  . Alcohol use: Yes    Alcohol/week: 3.0 standard drinks    Types: 3 Cans of beer per week  . Drug use: No  . Sexual activity: Yes  Other Topics Concern  . Not on file  Social History Narrative   Married, 3 daughters.   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   No T/A/Ds.         Pt is right-handed. He lives with his wife and three daughters in a two level home. Prior to prostate cancer surgery in 2018, he exercised regularly. He walks now, weather permitting.   Social Determinants of Health   Financial Resource Strain: Not on file  Food Insecurity: Not on file  Transportation Needs: Not on file  Physical Activity: Not on file  Stress: Not on file  Social Connections: Not on file  Intimate Partner Violence: Not on file    Past Surgical History:  Procedure Laterality Date  . COLONOSCOPY  04/20/11   Normal: recall 10 yrs  . ett   approx 2010   normal  . KNEE ARTHROSCOPY  2000   left  . LYMPHADENECTOMY Bilateral 08/06/2016   Procedure: PELVIC LYMPHADENECTOMY;  Surgeon: Raynelle Bring, MD;  Location: [facility];  Service: Urology;  Laterality: Bilateral;  . NCV & EMG  06/02/2018; 06/14/2018   07/03/2018 (LEGS)->NORMAL.  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  Marland Kitchen REFRACTIVE SURGERY  2000   both eyes  . ROBOT ASSISTED LAPAROSCOPIC RADICAL PROSTATECTOMY N/A 08/06/2016   Procedure: Path: adenocarcinoma with extraprostatic extension present, negative margins, lymph nodes NEG. XI ROBOTIC ASSISTED LAPAROSCOPIC RADICAL PROSTATECTOMY LEVEL 2;  Surgeon: Raynelle Bring, MD;  Location: [facility];  Service: Urology;  Laterality: N/A;  . ROTATOR CUFF REPAIR  2002   right  . ROTATOR CUFF REPAIR  2003   left    Family History  Problem Relation Age of Onset  . Diabetes Father   . Ovarian cancer Sister   . Colon cancer Neg Hx   . Stomach cancer Neg Hx   . Prostate cancer Neg Hx   . Breast cancer Neg Hx     Allergies  Allergen Reactions  . Atorvastatin Other (See Comments)    Myalgias/arthralgias    Current Outpatient Medications on File Prior to Visit  Medication Sig Dispense Refill  . cetirizine (ZYRTEC) 10 MG tablet Take 10 mg by mouth daily as needed for allergies.  (Patient not taking: No sig reported)    . ezetimibe (ZETIA) 10 MG tablet Take 1 tablet (10 mg total) by mouth daily. 90 tablet 3  . Multiple Vitamin (MULTIVITAMIN) tablet Take 1 tablet by mouth daily.    . sildenafil (VIAGRA) 100 MG tablet Take by mouth at bedtime.     No current facility-administered medications on file prior to visit.    BP 138/70   Pulse 86   Temp 98 F (36.7 C)   Resp 18   Ht 6' (1.829 m)   Wt 215 lb 12.8 oz (97.9 kg)   SpO2 97%   BMI 29.27 kg/m       Objective:   Physical Exam  General Mental Status- Alert. General Appearance- Not in acute distress.   Skin General: Color- Normal Color. Moisture-  Normal Moisture.  Neck Carotid Arteries- Normal color. Moisture- Normal Moisture. No carotid bruits. No JVD.  Chest and Lung Exam Auscultation: Breath Sounds:-Normal.  Cardiovascular Auscultation:Rythm- Regular. Murmurs & Other Heart Sounds:Auscultation of the heart reveals- No Murmurs.  Abdomen Inspection:-Inspeection Normal. Palpation/Percussion:Note:No mass. Palpation and Percussion of the abdomen reveal- Non Tender, Non Distended + BS, no rebound or guarding.   Neurologic Cranial Nerve exam:- CN III-XII intact(No nystagmus), symmetric smile. Strength:- 5/5 equal and symmetric strength both upper and lower extremities.  One small area on back looks like small scab verses tick. Removed  with forcep after sterilizing with alcohol.   Other scattered red bumps on back and legs where ticks were attached. On inspection I don't see any obvious ticks.     Assessment & Plan:

## 2020-07-23 ENCOUNTER — Other Ambulatory Visit: Payer: Self-pay

## 2020-07-23 ENCOUNTER — Telehealth: Payer: Self-pay | Admitting: Radiation Oncology

## 2020-07-23 NOTE — Telephone Encounter (Signed)
Received voicemail message from patient requesting a callback. Patient explained in September following completion of radiation therapy in June began having swelling in his feet and ankles. Patient explained he was seen by a "foot and leg specialist who couldn't find anything wrong." Patient goes onto explain the swelling in his feet and ankles has become worse and "now the skin of my lower legs is like leather." Patient reported burning in his feet and new onset "bed wetting." Patient verbalized on his voicemail that "his PCP doesn't know what it could be either."   Phoned patient back to inquire further. No answer. Left voicemail message explaining his symptoms are unrelated to the prostate bed radiation he completed in June 2021. Encouraged patient return to PCP or seek a second opinion. Stressed importance of having these symptoms worked up. Provided my direct number should the patient have further needs.

## 2020-07-24 ENCOUNTER — Encounter: Payer: Self-pay | Admitting: Family Medicine

## 2020-07-24 ENCOUNTER — Encounter: Payer: Self-pay | Admitting: Neurology

## 2020-07-24 ENCOUNTER — Ambulatory Visit (INDEPENDENT_AMBULATORY_CARE_PROVIDER_SITE_OTHER): Payer: 59 | Admitting: Family Medicine

## 2020-07-24 VITALS — BP 102/66 | HR 78 | Temp 97.9°F | Resp 16 | Ht 72.0 in | Wt 213.0 lb

## 2020-07-24 DIAGNOSIS — M792 Neuralgia and neuritis, unspecified: Secondary | ICD-10-CM | POA: Diagnosis not present

## 2020-07-24 DIAGNOSIS — G5793 Unspecified mononeuropathy of bilateral lower limbs: Secondary | ICD-10-CM

## 2020-07-24 MED ORDER — GABAPENTIN 100 MG PO CAPS: ORAL_CAPSULE | ORAL | 3 refills | Status: DC

## 2020-07-24 NOTE — Progress Notes (Signed)
OFFICE VISIT  07/24/2020  CC:  Chief Complaint  Patient presents with  . Legs swelling and burning    Started after radiation treatment and in the last couple of days, it has gotten worse. Thinks it is something vascular. Continues to elevate feet and takes Advil. Describe it as a hot burning sensation.    HPI:    Patient is a 65 y.o. Caucasian male who presents for "legs swelling and burning". Onset of this problem around Aug/Sept 2021---pt says "after my RT for prostate cancer, which finished June 2021". Says he has pain in entire both feet up into ankles and ?into LL's some---hot, burning sensation. Says "they got like elephant size" and "veins popping out" last week, went back down spontaneously.   Says they turn reddish color sometimes. Sx's constant, with waxing/waning intensity.  No symptoms in arms or hands.  Remote hx of some low back pain/spondylosis, hx of various leg/feet pains (some sound neuropathic, some sound like fasciitis/bursitis/metatarsalgia).  Has seen sports med and neuro in the past. Hx of seeing back surgeon as well as pain mgmt specialist in the past--no surg but ESI's were tried, hard to tell if helped.    NCS of LE's 06/02/2018 by Dr. Patel-->NORMAL-->pt was not having his current symptoms at that time, though.  He saw another [place] provider 07/17/20 for tick bites and was rx'd doxycycline.  ROS as above, plus--> no fevers, no CP, no SOB, no wheezing, no cough, no dizziness, no HAs, no rashes, no melena/hematochezia.  No polyuria or polydipsia.  No myalgias or arthralgias.  No focal weakness, paresthesias, or tremors.  No acute vision or hearing abnormalities.  No dysuria or unusual/new urinary urgency or frequency.   No n/v/d or abd pain.  No palpitations.     Past Medical History:  Diagnosis Date  . Atypical chest pain 05/2019   Insurance denied stress test I ordered.  Pt chose watchful waiting approach at that time.  . CTS (carpal tunnel syndrome)     bilat (NCS/EMG - confirmed 06/12/18)  . Diastasis of rectus abdominis   . Erectile dysfunction    sildenafil prn helpful  . History of kidney stones    bladder  . Hyperlipidemia    Intol of atorva and prava.  . IFG (impaired fasting glucose)    Per old records.  A1c 5.7% 05/2017.  A1c 5.3% 10/2017. A1c 5.9% 06/2020.  . Incisional hernia without obstruction or gangrene 05/2017  . Lumbar spondylosis    w/pars defect and listhesis (Dr. Saintclair Halsted).  Referred by neurosurg to pain mgmt MD and pt got ESI x 2 ---no help.  . 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 Paresthesias    UEs and LE's 2019---recommended neuro referral and pt wanted to think about it.  Then, Dr. Posey Pronto evaluated him 05/2018 and ordered NCS/EMGs to further eval feet paresthesias.  . Prostate cancer [facility])    Locally advanced prostate ca 06/2016.  Prostatectomy  RAL radical prostatectomy 07/2016.  Biochemical recurrence 2020/2021--salvage RT+ ADT. Undect PSA 02/2020.  . Pulmonary nodule 2009   Stable CT chest 2010--no further scans needed in pt at low risk for lung ca.  . 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    Past Surgical History:  Procedure Laterality Date  . COLONOSCOPY  04/20/11   Normal: recall 10 yrs  . ett  approx 2010   normal  .  KNEE ARTHROSCOPY  2000   left  . LYMPHADENECTOMY Bilateral 08/06/2016   Procedure: PELVIC LYMPHADENECTOMY;  Surgeon: Raynelle Bring, MD;  Location: [facility];  Service: Urology;  Laterality: Bilateral;  . NCV & EMG  06/02/2018; 06/14/2018   07/03/2018 (LEGS)->NORMAL.  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  Marland Kitchen REFRACTIVE SURGERY  2000   both eyes  . ROBOT ASSISTED LAPAROSCOPIC RADICAL PROSTATECTOMY N/A 08/06/2016   Procedure: Path: adenocarcinoma with extraprostatic extension present, negative margins, lymph nodes NEG. XI ROBOTIC ASSISTED LAPAROSCOPIC RADICAL PROSTATECTOMY LEVEL  2;  Surgeon: Raynelle Bring, MD;  Location: [facility];  Service: Urology;  Laterality: N/A;  . ROTATOR CUFF REPAIR  2002   right  . ROTATOR CUFF REPAIR  2003   left    Outpatient Medications Prior to Visit  Medication Sig Dispense Refill  . doxycycline (VIBRA-TABS) 100 MG tablet Take 1 tablet (100 mg total) by mouth 2 (two) times daily. 20 tablet 0  . ezetimibe (ZETIA) 10 MG tablet Take 1 tablet (10 mg total) by mouth daily. 90 tablet 3  . Multiple Vitamin (MULTIVITAMIN) tablet Take 1 tablet by mouth daily.    . sildenafil (VIAGRA) 100 MG tablet Take by mouth at bedtime.    . cetirizine (ZYRTEC) 10 MG tablet Take 10 mg by mouth daily as needed for allergies.  (Patient not taking: No sig reported)     No facility-administered medications prior to visit.    Allergies  Allergen Reactions  . Atorvastatin Other (See Comments)    Myalgias/arthralgias    ROS As per HPI  PE: Vitals with BMI 07/24/2020 07/17/2020 06/20/2020  Height 6\' 0"  6\' 0"  6\' 0"   Weight 213 lbs 215 lbs 13 oz 208 lbs 13 oz  BMI 28.88 18.84 16.60  Systolic 630 160 109  Diastolic 66 70 71  Pulse 78 86 81     Gen: Alert, well appearing.  Patient is oriented to person, place, time, and situation. AFFECT: pleasant, lucid thought and speech. CV: RRR, no m/r/g.   LUNGS: CTA bilat, nonlabored resps, good aeration in all lung fields. ABD: soft, NT/ND EXT: no clubbing or cyanosis.  no edema.  Feet and LL's normal color and temp. DP and PT pulses palpable bilat.  No tenderness to shallow palpation.  No vascular lesions. LE strength 5/5 prox/dist bilat.  LABS:    Chemistry      Component Value Date/Time   NA 141 06/20/2020 0927   NA 140 01/06/2017 1535   K 4.5 06/20/2020 0927   CL 107 06/20/2020 0927   CO2 26 06/20/2020 0927   BUN 17 06/20/2020 0927   BUN 13 01/06/2017 1535   CREATININE 0.89 06/20/2020 0927   GLU 120 01/06/2017 1535      Component Value Date/Time   CALCIUM 10.3 06/20/2020 0927   ALKPHOS 60  01/06/2017 1535   AST 18 06/20/2020 0927   ALT 23 06/20/2020 0927   BILITOT 0.6 06/20/2020 3235     Lab Results  Component Value Date   HGBA1C 5.9 06/20/2020    IMPRESSION AND PLAN:  Neuropathic pain both feet/LL's.  ? Neuropathy of unknown etiology.  Any of this related to his RT for tx of his prostate ca recurrence in summer 2021?  I can't explain his intermittent swelling (no edema on exam today).    He is understandably very frustrated. I can offer symptomatic care with trial of gabapentin but otherwise feel further w/u is needed by specialist.  I've made a referral to  get him back in with Dr. Posey Pronto in neurology.  Gabapentin discussed: start 100mg  tabs, 1 tid and slowly titrate up to 3 tid. I'll see him back in 1 wk.  An After Visit Summary was printed and given to the patient.  FOLLOW UP: Return in about 1 week (around 07/31/2020) for f/u feet pain.  Signed:  Crissie Sickles, MD           07/24/2020

## 2020-08-12 ENCOUNTER — Ambulatory Visit (INDEPENDENT_AMBULATORY_CARE_PROVIDER_SITE_OTHER): Payer: 59 | Admitting: Neurology

## 2020-08-12 ENCOUNTER — Encounter: Payer: Self-pay | Admitting: Neurology

## 2020-08-12 ENCOUNTER — Other Ambulatory Visit: Payer: Self-pay

## 2020-08-12 VITALS — BP 129/82 | HR 74 | Ht 72.0 in | Wt 214.0 lb

## 2020-08-12 DIAGNOSIS — M79671 Pain in right foot: Secondary | ICD-10-CM

## 2020-08-12 DIAGNOSIS — M79672 Pain in left foot: Secondary | ICD-10-CM

## 2020-08-12 DIAGNOSIS — G8929 Other chronic pain: Secondary | ICD-10-CM | POA: Diagnosis not present

## 2020-08-12 DIAGNOSIS — M545 Low back pain, unspecified: Secondary | ICD-10-CM

## 2020-08-12 NOTE — Progress Notes (Signed)
Follow-up Visit   Date: 08/12/20   Rodney Watkins MRN: [id number] DOB: 03-18-1956   Interim History: Rodney Watkins is a 65 y.o. right-handed Caucasian male with hyperlipidemia, lumbar spondylosis, prostate cancer s/p robotic prostatectomy returning to the clinic for follow-up of generalized paresthesias and bilateral feet pain. He is a retired Airline pilot.  The patient was accompanied to the clinic by self.  History of present illness: In 01/2015, he was on an elliptical machine and began having hip pain.  He was evaluated by orthopaedic surgery and was found to have bilateral L5 pars defect which was radiating to his hip. He was referred to Dr. Saintclair Halsted who did not find any role for surgery and referred to Dr. Brien Few for pain management.    In January 2018, he was diagnosed with prostate cancer and prostatectomy.  Following his surgery, he could not raise his right leg which was thought to be secondary to obturator nerve injury.  He was also having pain from his going into right medial thigh.  He has tried epidural steroid injections, dry needling, and chiropractic adjustments with no benefit.  He was having some initial improvement with physical therapy with respect to right leg weakness, however he exercises aggravated his low back pain so this was stopped.  He also complains of generalized tingling of the feet and bilateral arms.  He is concerned that he cannot run and feels that there is lack of coordination.  He has no problems with walking or jogging.    He complaints of bilateral leg pain and tingling of the feet.  He is able to walk and job, but cannot run because he feels that his coordination is not there.  He complains of sharp pain from the groin into his medial nerve.    He has not history diabetes.  He drinks beer over the weekend on the weekends.    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   He was evaluated here in 2020.  NCS/EMG of the legs is  normal, he has mild carpal tunnel syndrome.   In June 2021, his prostate cancer returned. He underwent radiation treatment.  In August 2021, he began having severe bilateral feet, severe pain, limited him from walking and putting pressure on the feet.  In October, pain transitioned into burning pain and a month later, he developed swelling in the feet and ankle. Pain is always worse after he has been on his feet for sometime.  He was referred to see Dr. [name], Sports Medicine and to see me.  He denies any numbness/tingling of the feet. No weakness or falls. He also complains of chronic low back pain.     Medications:  Current Outpatient Medications on File Prior to Visit  Medication Sig Dispense Refill  . cetirizine (ZYRTEC) 10 MG tablet Take 10 mg by mouth daily as needed for allergies.    Marland Kitchen ezetimibe (ZETIA) 10 MG tablet Take 1 tablet (10 mg total) by mouth daily. 90 tablet 3  . gabapentin (NEURONTIN) 100 MG capsule 1-3 caps po tid as needed for lower extremity neuropathic pain 100 capsule 3  . Multiple Vitamin (MULTIVITAMIN) tablet Take 1 tablet by mouth daily.    . sildenafil (VIAGRA) 100 MG tablet Take by mouth at bedtime.    Marland Kitchen doxycycline (VIBRA-TABS) 100 MG tablet Take 1 tablet (100 mg total) by mouth 2 (two) times daily. (Patient not taking: Reported on 08/12/2020) 20 tablet 0   No  current facility-administered medications on file prior to visit.    Allergies:  Allergies  Allergen Reactions  . Atorvastatin Other (See Comments)    Myalgias/arthralgias    Vital Signs:  BP 129/82   Pulse 74   Ht 6' (1.829 m)   Wt 214 lb (97.1 kg)   SpO2 98%   BMI 29.02 kg/m   Neurological Exam: MENTAL STATUS including orientation to time, place, person, recent and remote memory, attention span and concentration, language, and fund of knowledge is normal.  Speech is not dysarthric.  CRANIAL NERVES: No visual field defects. Pupils equal round and reactive to light.  Normal conjugate,  extra-ocular eye movements in all directions of gaze.  No ptosis. Normal facial sensation.  Face is symmetric. Palate elevates symmetrically.  Tongue is midline.  MOTOR:  Motor strength is 5/5 in all extremities.  No atrophy, fasciculations or abnormal movements.  No pronator drift.  Tone is normal.    MSRs:  Reflexes are 2+/4 throughout.  SENSORY:  Intact to vibration, temperature, and pin prick throughout.  COORDINATION/GAIT:  Normal finger-to- nose-finger and heel-to-shin.  Intact rapid alternating movements bilaterally.  Gait narrow based and stable.    Data:  MRI lumbar spine 07/09/2016: No change since 2016. 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 Degeneration and bulging of the disc. Mild foraminal narrowing bilaterally without visible compression of the exiting L5 nerve roots.  NCS/EMG of the legs 06/02/2018:  Normal  NCS/EMG of the arms 06/14/2018:  Bilateral median neuropathy at or distal to the wrist, consistent with a clinical diagnosis of carpal tunnel syndrome.  Overall, these findings are mild in degree electrically.   IMPRESSION/PLAN: 1.  Bilateral feet pain, etiology unclear.  Prior neurological testing has included NCS/EMG which was normal and MRI lumbar spine which did not show nerve impingement.  He does not have clear signs of neuropathy or radiculopathy, therefore, recommend repeat NCS/EMG of the legs.  I'm not sure how much of his pain may be stemming from musculoskeletal pain. If EMG is normal, consider skin biopsy next.   2.  Lumbar strain, chronic  - Start physical therapy  - Follow-up with Dr. Brien Few   Further recommendations pending results.  Thank you for allowing me to participate in patient's care.  If I can answer any additional questions, I would be pleased to do so.    Sincerely,    Brandice Busser K. Posey Pronto, DO

## 2020-08-12 NOTE — Patient Instructions (Addendum)
Nerve testing of the hands  Referral to physical therapy for low back pain  ELECTROMYOGRAM AND NERVE CONDUCTION STUDIES (EMG/NCS) INSTRUCTIONS  How to Prepare The neurologist conducting the EMG will need to know if you have certain medical conditions. Tell the neurologist and other EMG lab personnel if you: . Have a pacemaker or any other electrical medical device . Take blood-thinning medications . Have hemophilia, a blood-clotting disorder that causes prolonged bleeding Bathing Take a shower or bath shortly before your exam in order to remove oils from your skin. Don't apply lotions or creams before the exam.  What to Expect You'll likely be asked to change into a hospital gown for the procedure and lie down on an examination table. The following explanations can help you understand what will happen during the exam.  . Electrodes. The neurologist or a technician places surface electrodes at various locations on your skin depending on where you're experiencing symptoms. Or the neurologist may insert needle electrodes at different sites depending on your symptoms.  . Sensations. The electrodes will at times transmit a tiny electrical current that you may feel as a twinge or spasm. The needle electrode may cause discomfort or pain that usually ends shortly after the needle is removed. If you are concerned about discomfort or pain, you may want to talk to the neurologist about taking a short break during the exam.  . Instructions. During the needle EMG, the neurologist will assess whether there is any spontaneous electrical activity when the muscle is at rest - activity that isn't present in healthy muscle tissue - and the degree of activity when you slightly contract the muscle.  He or she will give you instructions on resting and contracting a muscle at appropriate times. Depending on what muscles and nerves the neurologist is examining, he or she may ask you to change positions during the exam.   After your EMG You may experience some temporary, minor bruising where the needle electrode was inserted into your muscle. This bruising should fade within several days. If it persists, contact your primary care doctor.

## 2020-08-15 ENCOUNTER — Telehealth: Payer: Self-pay | Admitting: Neurology

## 2020-08-15 NOTE — Telephone Encounter (Signed)
Called patient and was unable to leave a message due to mailbox being full. 

## 2020-08-15 NOTE — Telephone Encounter (Signed)
I do not have any recommendations either way, patient is welcome to see a message therapist, if he chooses.

## 2020-08-15 NOTE — Telephone Encounter (Signed)
Patient advised on Mychart

## 2020-08-15 NOTE — Telephone Encounter (Signed)
Patient wants to know if Dr. Posey Pronto will recommend a message therapist for his condition?

## 2020-09-09 ENCOUNTER — Ambulatory Visit (INDEPENDENT_AMBULATORY_CARE_PROVIDER_SITE_OTHER): Payer: 59 | Admitting: Dermatology

## 2020-09-09 ENCOUNTER — Ambulatory Visit: Payer: 59 | Admitting: Physician Assistant

## 2020-09-09 ENCOUNTER — Encounter: Payer: Self-pay | Admitting: Dermatology

## 2020-09-09 ENCOUNTER — Other Ambulatory Visit: Payer: Self-pay

## 2020-09-09 DIAGNOSIS — W57XXXA Bitten or stung by nonvenomous insect and other nonvenomous arthropods, initial encounter: Secondary | ICD-10-CM

## 2020-09-09 DIAGNOSIS — Z1283 Encounter for screening for malignant neoplasm of skin: Secondary | ICD-10-CM

## 2020-09-09 DIAGNOSIS — L920 Granuloma annulare: Secondary | ICD-10-CM

## 2020-09-09 DIAGNOSIS — S20161A Insect bite (nonvenomous) of breast, right breast, initial encounter: Secondary | ICD-10-CM | POA: Diagnosis not present

## 2020-09-09 DIAGNOSIS — S60861A Insect bite (nonvenomous) of right wrist, initial encounter: Secondary | ICD-10-CM | POA: Diagnosis not present

## 2020-09-17 ENCOUNTER — Ambulatory Visit (INDEPENDENT_AMBULATORY_CARE_PROVIDER_SITE_OTHER): Payer: 59 | Admitting: Neurology

## 2020-09-17 ENCOUNTER — Other Ambulatory Visit: Payer: Self-pay

## 2020-09-17 DIAGNOSIS — R202 Paresthesia of skin: Secondary | ICD-10-CM

## 2020-09-17 NOTE — Procedures (Signed)
[facility]  [place], [place]  [address], [place] [address] Tel: [phone] Fax:  [phone] Test Date:  09/17/2020  Patient: Rodney Watkins, Rodney Watkins DOB: [birth year] Physician: Narda Amber, DO  Sex: Male Height: 6' " Ref Phys: Narda Amber, DO  ID#: [id number]   Technician:    Patient Complaints: This is a 65 year old man referred for evaluation of chronic bilateral leg and feet pain.  NCV & EMG Findings: Electrodiagnostic testing of the right lower extremity and additional studies of the left shows: 1. Bilateral sural and superficial peroneal sensory responses are within normal limits. 2. Bilateral peroneal and tibial motor responses are within normal limits. 3. Bilateral tibial H reflex studies are within normal limits. 4. There is no evidence of active or chronic motor axonal changes affecting any of the tested muscles.  Motor unit configuration and recruitment pattern is within normal limits.  Impression: This is a normal study of the lower extremities.  Of note, there has been no significant change as compared to prior study on 06/02/2018.  In particular, there is no evidence of a sensorimotor polyneuropathy or lumbosacral radiculopathy   ___________________________ Narda Amber, DO    Nerve Conduction Studies Anti Sensory Summary Table   Stim Site NR Peak (ms) Norm Peak (ms) P-T Amp (V) Norm P-T Amp  Left Sup Peroneal Anti Sensory (Ant Lat Mall)  32C  12 cm    3.6 <4.6 13.1 >3  Right Sup Peroneal Anti Sensory (Ant Lat Mall)  32C  12 cm    2.2 <4.6 9.9 >3  Left Sural Anti Sensory (Lat Mall)  32C  Calf    4.2 <4.6 16.7 >3  Right Sural Anti Sensory (Lat Mall)  32C  Calf    2.3 <4.6 20.9 >3   Motor Summary Table   Stim Site NR Onset (ms) Norm Onset (ms) O-P Amp (mV) Norm O-P Amp Site1 Site2 Delta-0 (ms) Dist (cm) Vel (m/s) Norm Vel (m/s)  Left Peroneal Motor (Ext Dig Brev)  32C  Ankle    4.1 <6.0 5.0 >2.5 B Fib Ankle 8.2 38.0 46 >40  B Fib     12.3  4.3  Poplt B Fib 1.5 8.0 53 >40  Poplt    13.8  4.2         Right Peroneal Motor (Ext Dig Brev)  32C  Ankle    3.0 <6.0 5.7 >2.5 B Fib Ankle 8.3 37.0 45 >40  B Fib    11.3  4.3  Poplt B Fib 1.4 8.0 57 >40  Poplt    12.7  4.3         Left Tibial Motor (Abd Hall Brev)  32C  Ankle    4.5 <6.0 9.4 >4 Knee Ankle 9.8 42.0 43 >40  Knee    14.3  6.7         Right Tibial Motor (Abd Hall Brev)  32C  Ankle    5.0 <6.0 9.2 >4 Knee Ankle 9.3 44.0 47 >40  Knee    14.3  6.0          H Reflex Studies   NR H-Lat (ms) Lat Norm (ms) L-R H-Lat (ms)  Left Tibial (Gastroc)  32C     34.83 <35 0.00  Right Tibial (Gastroc)  32C     34.83 <35 0.00   EMG   Side Muscle Ins Act Fibs Psw Fasc Number Recrt Dur Dur. Amp Amp. Poly Poly. Comment  Right AntTibialis Nml Nml Nml Nml Nml  Nml Nml Nml Nml Nml Nml Nml N/A  Right Gastroc Nml Nml Nml Nml Nml Nml Nml Nml Nml Nml Nml Nml N/A  Right Flex Dig Long Nml Nml Nml Nml Nml Nml Nml Nml Nml Nml Nml Nml N/A  Right RectFemoris Nml Nml Nml Nml Nml Nml Nml Nml Nml Nml Nml Nml N/A  Right GluteusMed Nml Nml Nml Nml Nml Nml Nml Nml Nml Nml Nml Nml N/A  Right BicepsFemS Nml Nml Nml Nml Nml Nml Nml Nml Nml Nml Nml Nml N/A  Left BicepsFemS Nml Nml Nml Nml Nml Nml Nml Nml Nml Nml Nml Nml N/A  Left AntTibialis Nml Nml Nml Nml Nml Nml Nml Nml Nml Nml Nml Nml N/A  Left Gastroc Nml Nml Nml Nml Nml Nml Nml Nml Nml Nml Nml Nml N/A  Left Flex Dig Long Nml Nml Nml Nml Nml Nml Nml Nml Nml Nml Nml Nml N/A  Left RectFemoris Nml Nml Nml Nml Nml Nml Nml Nml Nml Nml Nml Nml N/A  Left GluteusMed Nml Nml Nml Nml Nml Nml Nml Nml Nml Nml Nml Nml N/A      Waveforms:

## 2020-09-18 NOTE — Progress Notes (Signed)
Nerve conduction study does not show evidence of severe or large injury to the major nerves in the low back or the major nerves down the legs.  It effectively is a normal study.  Nerve conduction studies can sometimes miss small or mild disease but should not miss significant or severe nerve injury.  Recommend return to clinic.  We can discuss potential next steps and treatment options.

## 2020-09-19 LAB — PSA: PSA: <0.015

## 2020-09-21 ENCOUNTER — Encounter: Payer: Self-pay | Admitting: Dermatology

## 2020-09-21 NOTE — Progress Notes (Signed)
   Follow-Up Visit   Subjective  Rodney Watkins is a 65 y.o. male who presents for the following: Annual Exam (Chest- few spots  - bleed when I get in the sun).  General skin examination Location: 2 spots to check on chest plus upper back Duration:  Quality:  Associated Signs/Symptoms: Modifying Factors:  Severity:  Timing: Context:   Objective  Well appearing patient in no apparent distress; mood and affect are within normal limits. Objective  Left Upper Back: No atypical pigmented lesions waist up.  Flattopped tan papules left chest and upper back compatible with seborrheic keratoses.  Objective  Right Breast, Right Wrist - Posterior: Nonspecific papular 4 mm granulomatous lesions compatible with tick bite or other insect bite granuloma.  Images        All skin waist up examined.   Assessment & Plan    Encounter for screening for malignant neoplasm of skin Left Upper Back  Leave if stable  Bitten or stung by nonvenomous insect and other nonvenomous arthropods, initial encounter (2) Right Wrist - Posterior; Right Breast  I discussed obtaining confirmatory biopsies.  Historically stable and for now we will defer.      I, Lavonna Monarch, MD, have reviewed all documentation for this visit.  The documentation on 09/21/20 for the exam, diagnosis, procedures, and orders are all accurate and complete.

## 2020-09-24 ENCOUNTER — Encounter: Payer: Self-pay | Admitting: Family Medicine

## 2020-09-24 ENCOUNTER — Telehealth: Payer: Self-pay | Admitting: Neurology

## 2020-09-24 DIAGNOSIS — M79671 Pain in right foot: Secondary | ICD-10-CM

## 2020-09-24 DIAGNOSIS — M79672 Pain in left foot: Secondary | ICD-10-CM

## 2020-09-24 NOTE — Telephone Encounter (Signed)
EMG results reviewed again and are normal, as previously mentioned.  I will forward EMG results to his PCP.  The only other testing I can suggest is a skin biopsy, which would look for small fiber neuropathy.  If he would like to pursue this, please refer to podiatry for skin biopsy. Thanks.

## 2020-09-24 NOTE — Telephone Encounter (Signed)
New message    Patient calling for test result.  Last office visit on  5.10.2022

## 2020-09-24 NOTE — Telephone Encounter (Signed)
Spoke to pt--stated had EMG results read by Dr. [name] and requesting if  Dr. Posey Pronto to re-reviewed the results and what is the next step. Please advise. Pt requesting to fax the results to PCP--McGowen, Adrian Blackwater, MD.

## 2020-09-25 NOTE — Telephone Encounter (Signed)
Pt agreed for the next step to see podiatry. Sent referral to podiatry [facility]--skin biopsy.

## 2020-09-25 NOTE — Telephone Encounter (Signed)
LVM--to call the office back regarding EMG results.

## 2020-09-30 ENCOUNTER — Encounter: Payer: Self-pay | Admitting: Family Medicine

## 2020-09-30 ENCOUNTER — Ambulatory Visit: Payer: 59 | Admitting: Neurology

## 2020-10-10 ENCOUNTER — Ambulatory Visit (INDEPENDENT_AMBULATORY_CARE_PROVIDER_SITE_OTHER): Payer: 59 | Admitting: Podiatry

## 2020-10-10 ENCOUNTER — Other Ambulatory Visit: Payer: Self-pay

## 2020-10-10 DIAGNOSIS — M722 Plantar fascial fibromatosis: Secondary | ICD-10-CM

## 2020-10-10 DIAGNOSIS — G629 Polyneuropathy, unspecified: Secondary | ICD-10-CM | POA: Diagnosis not present

## 2020-10-10 DIAGNOSIS — M79673 Pain in unspecified foot: Secondary | ICD-10-CM

## 2020-10-10 DIAGNOSIS — G8929 Other chronic pain: Secondary | ICD-10-CM

## 2020-10-15 NOTE — Progress Notes (Signed)
Subjective:   Patient ID: Rodney Watkins, male   DOB: 65 y.o.   MRN: [id number]   HPI 65 year old male presents the office with concerns of bilateral foot pain.  Describes a burning sensation intermittently.  He states that he has difficulty putting pressure on the foot in the morning it feels like his feet are on fire.  He was previously given gabapentin by his primary care physician.  He does feel that this started in June 2021.  He started radiation.  He previously seen sports medicine and an MRI of his foot.  At that time he states that plantar fasciitis was ruled out.   Review of Systems  All other systems reviewed and are negative.  Past Medical History:  Diagnosis Date  . Atypical chest pain 05/2019   Insurance denied stress test I ordered.  Pt chose watchful waiting approach at that time.  . CTS (carpal tunnel syndrome)    bilat (NCS/EMG - confirmed 06/12/18)  . Diastasis of rectus abdominis   . Erectile dysfunction    sildenafil prn helpful  . History of kidney stones    bladder  . Hyperlipidemia    Intol of atorva and prava.  . IFG (impaired fasting glucose)    Per old records.  A1c 5.7% 05/2017.  A1c 5.3% 10/2017. A1c 5.9% 06/2020.  . Incisional hernia without obstruction or gangrene 05/2017  . Kidney stone   . Lumbar spondylosis    w/pars defect and listhesis (Dr. Saintclair Halsted).  Referred by neurosurg to pain mgmt MD and pt got ESI x 2 ---no help.  . 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 Paresthesias    UEs and LE's 2019---recommended neuro referral and pt wanted to think about it.  Then, Dr. Posey Pronto evaluated him 05/2018 and did NCS/EMG-->normal.  The sensation transitioned to BURNING pain->NCS/EMG normal again 09/2020.  . Prostate cancer [facility])    Locally advanced prostate ca 06/2016.  Prostatectomy  RAL radical prostatectomy 07/2016.  Biochemical recurrence 2020/2021--salvage RT+ ADT. Undect PSA 02/2020, 09/2020.  . Pulmonary nodule 2009   Stable CT chest 2010--no further scans  needed in pt at low risk for lung ca.  . 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    Past Surgical History:  Procedure Laterality Date  . COLONOSCOPY  04/20/11   Normal: recall 10 yrs  . ett  approx 2010   normal  . KNEE ARTHROSCOPY  2000   left  . LYMPHADENECTOMY Bilateral 08/06/2016   Procedure: PELVIC LYMPHADENECTOMY;  Surgeon: Raynelle Bring, MD;  Location: [facility];  Service: Urology;  Laterality: Bilateral;  . NCV & EMG  06/02/2018; 06/14/2018; 09/2020   07/03/2018 (LEGS)->NORMAL.  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  09/2020 LEGS NORMAL   . REFRACTIVE SURGERY  2000   both eyes  . ROBOT ASSISTED LAPAROSCOPIC RADICAL PROSTATECTOMY N/A 08/06/2016   Procedure: Path: adenocarcinoma with extraprostatic extension present, negative margins, lymph nodes NEG. XI ROBOTIC ASSISTED LAPAROSCOPIC RADICAL PROSTATECTOMY LEVEL 2;  Surgeon: Raynelle Bring, MD;  Location: [facility];  Service: Urology;  Laterality: N/A;  . ROTATOR CUFF REPAIR  2002   right  . ROTATOR CUFF REPAIR  2003   left     Current Outpatient Medications:  .  cetirizine (ZYRTEC) 10 MG tablet, Take 10 mg by mouth daily as needed for allergies., Disp: , Rfl:  .  doxycycline (VIBRA-TABS) 100 MG  tablet, Take 1 tablet (100 mg total) by mouth 2 (two) times daily., Disp: 20 tablet, Rfl: 0 .  ezetimibe (ZETIA) 10 MG tablet, Take 1 tablet (10 mg total) by mouth daily., Disp: 90 tablet, Rfl: 3 .  FLOWFLEX COVID-19 AG HOME TEST KIT, , Disp: , Rfl:  .  gabapentin (NEURONTIN) 100 MG capsule, 1-3 caps po tid as needed for lower extremity neuropathic pain, Disp: 100 capsule, Rfl: 3 .  Multiple Vitamin (MULTIVITAMIN) tablet, Take 1 tablet by mouth daily., Disp: , Rfl:  .  sildenafil (VIAGRA) 100 MG tablet, Take by mouth at bedtime., Disp: , Rfl:   Allergies  Allergen Reactions  . Atorvastatin Other (See Comments)     Myalgias/arthralgias         Objective:  Physical Exam  General: AAO x3, NAD  Dermatological: Skin is warm, dry and supple bilateral. There are no open sores, no preulcerative lesions, no rash or signs of infection present.  Vascular: Dorsalis Pedis artery and Posterior Tibial artery pedal pulses are 2/4 bilateral with immedate capillary fill time. There is no pain with calf compression, swelling, warmth, erythema.   Neruologic: Grossly intact via light touch bilateral.  Sensation intact with Semmes Weinstein monofilament but describing burning mostly to the arch of the foot and the bottom.  Musculoskeletal: There is mild tenderness on medial band plantar fashion the arch of the foot.  Is no area of pinpoint tenderness.  Flexor, extensor tendons appear to be intact.  Muscular strength 5/5 in all groups tested bilateral.  Gait: Unassisted, Nonantalgic.       Assessment:   Possible small fiber neuropathy, ? Plantar fasciitis    Plan:  -Treatment options discussed including all alternatives, risks, and complications -Etiology of symptoms were discussed -I discussed with him at this point possibly doing a nerve biopsy.  After discussion was proceed with this.  We will plan on doing this next week. -I still suspect symptoms could be due to underlying plantar fasciitis or muscle tightness in the arch of the foot.  Discussed general stretching exercises will be helpful as well as continue good supportive shoes.  Trula Slade DPM

## 2020-10-16 ENCOUNTER — Ambulatory Visit (INDEPENDENT_AMBULATORY_CARE_PROVIDER_SITE_OTHER): Payer: 59 | Admitting: Podiatry

## 2020-10-16 ENCOUNTER — Other Ambulatory Visit: Payer: Self-pay

## 2020-10-16 DIAGNOSIS — G629 Polyneuropathy, unspecified: Secondary | ICD-10-CM

## 2020-10-16 DIAGNOSIS — G6289 Other specified polyneuropathies: Secondary | ICD-10-CM

## 2020-10-21 NOTE — Progress Notes (Signed)
Subjective: 65 year old male presents the office today for nerve biopsy.  Symptoms are unchanged compared to last appointment. Denies any systemic complaints such as fevers, chills, nausea, vomiting. No acute changes since last appointment, and no other complaints at this time.   Objective: AAO x3, NAD DP/PT pulses palpable bilaterally, CRT less than 3 seconds Symptoms he still describing his feet being on fire.  He does get discomfort in the arch of the foot as well as on the left plantar fascia, however no significant pain today.  No Tinel sign. No pain with calf compression, swelling, warmth, erythema  Assessment: Concern for small fiber neuropathy  Plan: -All treatment options discussed with the patient including all alternatives, risks, complications.  -After discussion like to proceed with a nerve biopsy.  Discussed risks, alternatives, complications of the procedure and he wishes to proceed.  Skin was prepped with alcohol and approximate 10 cm proximal to the lateral malleolus I utilized a total of 2 cc of lidocaine with epinephrine bilaterally for hemostasis.  The skin was then prepped with Betadine, alcohol.  I then utilized 3 mm punch biopsy bilaterally.  They were appropriately labeled.  Skin was then cleaned with alcohol and antibiotic ointment and bandage were applied.  He tolerated the procedure well and complications.  Postinjection care was discussed. -The biopsies were then processed per standard technique and were sent to [facility].  -Patient encouraged to call the office with any questions, concerns, change in symptoms.   Trula Slade DPM

## 2020-10-31 ENCOUNTER — Ambulatory Visit (INDEPENDENT_AMBULATORY_CARE_PROVIDER_SITE_OTHER): Payer: 59 | Admitting: Podiatry

## 2020-10-31 ENCOUNTER — Other Ambulatory Visit: Payer: Self-pay

## 2020-10-31 DIAGNOSIS — G629 Polyneuropathy, unspecified: Secondary | ICD-10-CM

## 2020-10-31 NOTE — Patient Instructions (Signed)
You can try adding alpha lipoic acid as well as your normal vitamin.

## 2020-11-07 NOTE — Progress Notes (Signed)
Subjective: 65 year old male presents office for follow-up evaluation after having neuro biopsy performed.  He presents today to discuss the results.  He states that procedure sites are healing well.  Right signs are mostly healed.  Left side shows a small opening but denies any drainage or pus. Denies any systemic complaints such as fevers, chills, nausea, vomiting. No acute changes since last appointment, and no other complaints at this time.   Objective: AAO x3, NAD DP/PT pulses palpable bilaterally, CRT less than 3 seconds Status post punch biopsies bilaterally.  Right side is healed small scab.  Left side still small opening there is no drainage or pus.  Faint rim of surrounding erythema likely more from inflammation as opposed to infection.  There is no ascending cellulitis.  No pain bilaterally along the procedure sites.  No pain with calf compression, swelling, warmth, erythema  Assessment: Small fiber neuropathy  Plan: -All treatment options discussed with the patient including all alternatives, risks, complications.  -I continued the no biopsies with him which should confirm small fiber neuropathy.  He is to follow-up with Dr. Posey Pronto with neurology as well.  He is already on gabapentin which seems to help some.  Discussed adding alpha lipoic acid to his supplements. -Continue to wash the procedure sites with soap and water and apply a small amount of antibiotic ointment dressing changes daily.  Monitor for any signs or symptoms of infection. -Patient encouraged to call the office with any questions, concerns, change in symptoms.   Trula Slade DPM

## 2020-11-14 ENCOUNTER — Encounter: Payer: Self-pay | Admitting: Neurology

## 2020-11-14 ENCOUNTER — Ambulatory Visit (INDEPENDENT_AMBULATORY_CARE_PROVIDER_SITE_OTHER): Payer: 59 | Admitting: Neurology

## 2020-11-14 ENCOUNTER — Other Ambulatory Visit: Payer: 59

## 2020-11-14 ENCOUNTER — Other Ambulatory Visit: Payer: Self-pay

## 2020-11-14 VITALS — BP 130/82 | HR 76 | Ht 72.0 in | Wt 209.0 lb

## 2020-11-14 DIAGNOSIS — G629 Polyneuropathy, unspecified: Secondary | ICD-10-CM | POA: Diagnosis not present

## 2020-11-14 DIAGNOSIS — M545 Low back pain, unspecified: Secondary | ICD-10-CM | POA: Diagnosis not present

## 2020-11-14 DIAGNOSIS — G8929 Other chronic pain: Secondary | ICD-10-CM | POA: Diagnosis not present

## 2020-11-14 LAB — B12 AND FOLATE PANEL
Folate: >24.4 ng/mL (ref >5.9)
Vitamin B-12: 314 pg/mL (ref 211–911)

## 2020-11-14 LAB — C-REACTIVE PROTEIN: CRP: <1 mg/dL (ref 0.5–20.0)

## 2020-11-14 LAB — TSH: TSH: 1.83 u[IU]/mL (ref 0.35–5.50)

## 2020-11-14 LAB — SEDIMENTATION RATE: Sed Rate: 8 mm/hr (ref 0–20)

## 2020-11-14 NOTE — Progress Notes (Signed)
Follow-up Visit   Date: 11/14/20   Rodney Watkins MRN: [id number] DOB: Nov 21, 1955   Interim History: Rodney Watkins is a 65 y.o. right-handed Caucasian male with hyperlipidemia, lumbar spondylosis, prostate cancer s/p robotic prostatectomy returning to the clinic for follow-up of small fiber neuropathy. He is a retired Airline pilot.  The patient was accompanied to the clinic by self.  History of present illness: In 01/2015, he was on an elliptical machine and began having hip pain.  He was evaluated by orthopaedic surgery and was found to have bilateral L5 pars defect which was radiating to his hip. He was referred to Dr. Saintclair Halsted who did not find any role for surgery and referred to Dr. Brien Few for pain management.    In January 2018, he was diagnosed with prostate cancer and prostatectomy.  Following his surgery, he could not raise his right leg which was thought to be secondary to obturator nerve injury.  He was also having pain from his going into right medial thigh.  He has tried epidural steroid injections, dry needling, and chiropractic adjustments with no benefit.  He was having some initial improvement with physical therapy with respect to right leg weakness, however he exercises aggravated his low back pain so this was stopped.  He also complains of generalized tingling of the feet and bilateral arms.  He is concerned that he cannot run and feels that there is lack of coordination.  He has no problems with walking or jogging.    He complaints of bilateral leg pain and tingling of the feet.  He is able to walk and job, but cannot run because he feels that his coordination is not there.  He complains of sharp pain from the groin into his medial nerve.    He has not history diabetes.  He drinks beer over the weekend on the weekends.    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   He was evaluated here in 2020.  NCS/EMG of the legs is normal, he has mild carpal  tunnel syndrome.   In June 2021, his prostate cancer returned. He underwent radiation treatment.  In August 2021, he began having severe bilateral feet, severe pain, limited him from walking and putting pressure on the feet.  In October, pain transitioned into burning pain and a month later, he developed swelling in the feet and ankle. Pain is always worse after he has been on his feet for sometime.  He was referred to see Dr. [name], Sports Medicine and to see me.  He denies any numbness/tingling of the feet. No weakness or falls. He also complains of chronic low back pain.    UPDATE 11/14/2020:  He recently had skin biopsy which confirmed the presence of small fiber neuropathy.  He continues to complain to burning pain in the feet.  He takes gabapentin 142m as needed because of sedation.  He does not want to take medication and is hoping to be able to fix the neuropathy. He also complains of achy low back pain, hip pain, diarrhea, and blurred vision.  He has seen an eye doctor.  He completed PT in the past for his back, which helped.     Medications:  Current Outpatient Medications on File Prior to Visit  Medication Sig Dispense Refill   cetirizine (ZYRTEC) 10 MG tablet Take 10 mg by mouth daily as needed for allergies.     ezetimibe (ZETIA) 10 MG tablet Take 1 tablet (10  mg total) by mouth daily. 90 tablet 3   FLOWFLEX COVID-19 AG HOME TEST KIT      gabapentin (NEURONTIN) 100 MG capsule 1-3 caps po tid as needed for lower extremity neuropathic pain (Patient taking differently: 1-3 caps po tid as needed for lower extremity neuropathic pain PRN) 100 capsule 3   Multiple Vitamin (MULTIVITAMIN) tablet Take 1 tablet by mouth daily.     sildenafil (VIAGRA) 100 MG tablet Take by mouth at bedtime.     doxycycline (VIBRA-TABS) 100 MG tablet Take 1 tablet (100 mg total) by mouth 2 (two) times daily. (Patient not taking: Reported on 11/14/2020) 20 tablet 0   No current facility-administered medications on file  prior to visit.    Allergies:  Allergies  Allergen Reactions   Atorvastatin Other (See Comments)    Myalgias/arthralgias    Vital Signs:  BP 130/82   Pulse 76   Ht 6' (1.829 m)   Wt 209 lb (94.8 kg)   SpO2 98%   BMI 28.35 kg/m   Neurological Exam: MENTAL STATUS including orientation to time, place, person, recent and remote memory, attention span and concentration, language, and fund of knowledge is normal.  Speech is not dysarthric.  CRANIAL NERVES: No visual field defects. Pupils equal round and reactive to light.  Normal conjugate, extra-ocular eye movements in all directions of gaze.  No ptosis. Normal facial sensation.  Face is symmetric.   MOTOR:  Motor strength is 5/5 in all extremities.  No atrophy, fasciculations or abnormal movements.  No pronator drift.  Tone is normal.    MSRs:  Reflexes are 2+/4 throughout.  SENSORY:  Intact to vibration, temperature, and pin prick throughout.  COORDINATION/GAIT:  Normal finger-to- nose-finger and heel-to-shin.  Intact rapid alternating movements bilaterally.  Gait narrow based and stable.    Data:  MRI lumbar spine 07/09/2016: No change since 2016. 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 Degeneration and bulging of the disc. Mild foraminal narrowing bilaterally without visible compression of the exiting L5 nerve roots.  NCS/EMG of the legs 06/02/2018:  Normal  NCS/EMG of the arms 06/14/2018:  Bilateral median neuropathy at or distal to the wrist, consistent with a clinical diagnosis of carpal tunnel syndrome.  Overall, these findings are mild in degree electrically.  NCS/EMG of the legs 09/17/2020:  This is a normal study of the lower extremities.  Of note, there has been no significant change as compared to prior study on 06/02/2018.  In particular, there is no evidence of a sensorimotor polyneuropathy or lumbosacral radiculopathy   Skin biopsy 10/16/2020:  Intraepidermal nerve fiber density moderately  reduced at the right calf, severely reduced at the left consistent with small fiber neuropathy  Lab Results  Component Value Date   HGBA1C 5.9 06/20/2020   Lab Results  Component Value Date   TSH 1.33 12/13/2018   Lab Results  Component Value Date   VITAMINB12 410 05/25/2017     IMPRESSION/PLAN: Small fiber neuropathy affecting the feet, painful.  NCS/EMG without large fiber neuropathy.    - Check ESR, CRP, vitamin B12, folate, copper, TSH, SPEP with IFE, gliadin antibody, ANA  - Start gabapentin 186m in the morning. Patient aware that he may not get adequate relief at a subtherapeutic low.  He does not wish to take it at bedtime because of fear he may not be able to get up to urinate  - Patient had many questions which I answered as best as I could  -  If we are unable to find treatable etiology, he would be interested in a second opinion at an academic center.  2.  Lumbar strain, chronic  - Start physical therapy  - Follow-up with Dr. Brien Few  Return to clinic in 3 months  Thank you for allowing me to participate in patient's care.  If I can answer any additional questions, I would be pleased to do so.    Sincerely,    Khadeejah Castner K. Posey Pronto, DO

## 2020-11-14 NOTE — Patient Instructions (Addendum)
Check labs  Start gabapentin 100mg  in the morning.  If it does not make you too sleepy the dose may be increased.  Start physical therapy for low back pain  Return to clinic in 3 months

## 2020-11-18 LAB — IMMUNOFIXATION, SERUM
IgA/Immunoglobulin A, Serum: 61 mg/dL (ref 61–437)
IgG (Immunoglobin G), Serum: 982 mg/dL (ref 603–1613)
IgM (Immunoglobulin M), Srm: 32 mg/dL (ref 20–172)

## 2020-11-19 LAB — PROTEIN ELECTROPHORESIS, SERUM
Abnormal Protein Band1: 0.6 g/dL — ABNORMAL HIGH
Albumin ELP: 4.2 g/dL (ref 3.8–4.8)
Alpha 1: 0.2 g/dL (ref 0.2–0.3)
Alpha 2: 0.6 g/dL (ref 0.5–0.9)
Beta 2: 0.2 g/dL (ref 0.2–0.5)
Beta Globulin: 0.4 g/dL (ref 0.4–0.6)
Gamma Globulin: 0.8 g/dL (ref 0.8–1.7)
Total Protein: 6.4 g/dL (ref 6.1–8.1)

## 2020-11-19 LAB — IMMUNOFIXATION ELECTROPHORESIS
IgG (Immunoglobin G), Serum: 1012 mg/dL (ref 600–1540)
IgM, Serum: 31 mg/dL — ABNORMAL LOW (ref 50–300)
Immunofix Electr Int: DETECTED
Immunoglobulin A: 65 mg/dL — ABNORMAL LOW (ref 70–320)

## 2020-11-19 LAB — ANA: Anti Nuclear Antibody (ANA): NEGATIVE

## 2020-11-19 LAB — GLIADIN ANTIBODIES, SERUM
Gliadin IgA: 1.1 U/mL
Gliadin IgG: <1 U/mL

## 2020-11-19 LAB — COPPER, SERUM: Copper: 84 ug/dL (ref 70–175)

## 2020-11-19 LAB — VITAMIN B1: Vitamin B1 (Thiamine): 27 nmol/L (ref 8–30)

## 2020-11-20 ENCOUNTER — Telehealth: Payer: Self-pay

## 2020-11-20 DIAGNOSIS — D472 Monoclonal gammopathy: Secondary | ICD-10-CM

## 2020-11-20 NOTE — Telephone Encounter (Signed)
-----   Message from Rodney Berthold, DO sent at 11/19/2020  4:27 PM EDT ----- Please inform pt that his labs show increased protein in the blood.  This can sometimes be associated with neuropathy and to better understand whether it is benign or associated with another condition, I would like him to see hematology for their opinion.  The remains labs are normal. Dx:  monoclonal gammopathy.

## 2020-11-20 NOTE — Telephone Encounter (Signed)
Called patient and informed him of labs and that his labs show increased protein in the blood.  Informed patient that this can sometimes be associated with neuropathy and to better understand whether it is benign or associated with another condition, Dr. Posey Pronto  would like him to see hematology for their opinion.  Informed patient that the remaining labs are normal.   Patient verbalized understanding of labs and recommendations and is ok with being sent to Hematology. Referral has been created and sent. Patient is aware that a referral has been sent and has been advised to call me next week if he has not heard from Hematology.

## 2020-11-21 ENCOUNTER — Telehealth: Payer: Self-pay | Admitting: Oncology

## 2020-11-21 NOTE — Telephone Encounter (Signed)
Received a new hem referral from Dr. Posey Pronto for monoclonal gammopathy. [name] has been cld and scheduled to see Dr. Alen Blew on 7/21 at 11am. Pt aware to arrive 20 minutes early.

## 2020-11-28 ENCOUNTER — Other Ambulatory Visit: Payer: Self-pay

## 2020-11-28 ENCOUNTER — Inpatient Hospital Stay: Payer: 59 | Attending: Oncology | Admitting: Oncology

## 2020-11-28 VITALS — BP 138/80 | HR 66 | Temp 97.8°F | Resp 18 | Ht 72.0 in | Wt 207.3 lb

## 2020-11-28 DIAGNOSIS — D472 Monoclonal gammopathy: Secondary | ICD-10-CM

## 2020-11-28 DIAGNOSIS — G629 Polyneuropathy, unspecified: Secondary | ICD-10-CM | POA: Diagnosis not present

## 2020-11-28 DIAGNOSIS — Z8546 Personal history of malignant neoplasm of prostate: Secondary | ICD-10-CM | POA: Diagnosis not present

## 2020-11-28 DIAGNOSIS — Z79899 Other long term (current) drug therapy: Secondary | ICD-10-CM | POA: Diagnosis not present

## 2020-11-28 DIAGNOSIS — Z923 Personal history of irradiation: Secondary | ICD-10-CM | POA: Diagnosis not present

## 2020-11-28 NOTE — Progress Notes (Signed)
Reason for the request:    Monoclonal gammopathy  HPI: I was asked by Dr. Posey Pronto to evaluate Rodney Watkins for the evaluation of monoclonal gammopathy.  He is a 64 year old man with history of prostate cancer dating back to 2018.  At that time he underwent radical prostatectomy under the care of Dr. Alinda Money with the final pathology showed T3BN0 Gleason score 7.  He developed rise in his PSA and 2021 and received salvage radiation therapy under the care of Dr. Tammi Klippel completed in June 2021 after receiving 51 Pearline Cables to the prostate and boosted to 68.4 Gray additional to the prostate fossa.  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  Part of his neurological work-up included a serum protein electrophoresis which showed a abnormal monoclonal protein is 0.6 g/dL.  Quantitative immunoglobulins were normal and immunofixation showed IgG kappa subtype.  His IgG level was normal at 982.  Clinically, he reports painful neuropathy bilaterally in his lower extremities but no other constitutional symptoms.  He denies any weight loss or appetite changes.  He denies any bone pain or pathological fractures.  From status quality of life remain excellent.  Chemistries did not show any evidence of renal dysfunction, electrolyte imbalance, abnormal total protein or calcium.  His CBC was also normal.  He thinks his   He does not report any headaches, blurry vision, syncope or seizures. Does not report any fevers, chills or sweats.  Does not report any cough, wheezing or hemoptysis.  Does not report any chest pain, palpitation, orthopnea or leg edema.  Does not report any nausea, vomiting or abdominal pain.  Does not report any constipation or diarrhea.  Does not report any skeletal complaints.    Does not report frequency, urgency or hematuria.  Does not report any skin rashes or lesions. Does not report any heat or cold intolerance.  Does not report any  lymphadenopathy or petechiae.  Does not report any anxiety or depression.  Remaining review of systems is negative.     Past Medical History:  Diagnosis Date   Atypical chest pain 05/2019   Insurance denied stress test I ordered.  Pt chose watchful waiting approach at that time.   CTS (carpal tunnel syndrome)    bilat (NCS/EMG - confirmed 06/12/18)   Diastasis of rectus abdominis    Erectile dysfunction    sildenafil prn helpful   History of kidney stones    bladder   Hyperlipidemia    Intol of atorva and prava.   IFG (impaired fasting glucose)    Per old records.  A1c 5.7% 05/2017.  A1c 5.3% 10/2017. A1c 5.9% 06/2020.   Incisional hernia without obstruction or gangrene 05/2017   Kidney stone    Lumbar spondylosis    w/pars defect and listhesis (Dr. Saintclair Halsted).  Referred by neurosurg to pain mgmt MD and pt got ESI x 2 ---no help.   Osteoarthritis 2021   1st MTP R foot   Paresthesias    UEs and LE's 2019---recommended neuro referral and pt wanted to think about it.  Then, Dr. Posey Pronto evaluated him 05/2018 and did NCS/EMG-->normal.  The sensation transitioned to BURNING pain->NCS/EMG normal again 09/2020.   Prostate cancer [facility])    Locally advanced prostate ca 06/2016.  Prostatectomy  RAL radical prostatectomy 07/2016.  Biochemical recurrence 2020/2021--salvage RT+ ADT. Undect PSA 02/2020, 09/2020.   Pulmonary nodule 2009   Stable CT chest 2010--no further scans needed in pt at low risk for  lung ca.   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  :   Past Surgical History:  Procedure Laterality Date   COLONOSCOPY  04/20/11   Normal: recall 10 yrs   ett  approx 2010   normal   KNEE ARTHROSCOPY  2000   left   LYMPHADENECTOMY Bilateral 08/06/2016   Procedure: PELVIC LYMPHADENECTOMY;  Surgeon: Raynelle Bring, MD;  Location: [facility];  Service: Urology;  Laterality: Bilateral;   NCV & EMG  06/02/2018;  06/14/2018; 09/2020   07/03/2018 (LEGS)->NORMAL.  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  09/2020 LEGS NORMAL    REFRACTIVE SURGERY  2000   both eyes   ROBOT ASSISTED LAPAROSCOPIC RADICAL PROSTATECTOMY N/A 08/06/2016   Procedure: Path: adenocarcinoma with extraprostatic extension present, negative margins, lymph nodes NEG. XI ROBOTIC ASSISTED LAPAROSCOPIC RADICAL PROSTATECTOMY LEVEL 2;  Surgeon: Raynelle Bring, MD;  Location: [facility];  Service: Urology;  Laterality: N/A;   ROTATOR CUFF REPAIR  2002   right   ROTATOR CUFF REPAIR  2003   left  :   Current Outpatient Medications:    cetirizine (ZYRTEC) 10 MG tablet, Take 10 mg by mouth daily as needed for allergies., Disp: , Rfl:    ezetimibe (ZETIA) 10 MG tablet, Take 1 tablet (10 mg total) by mouth daily., Disp: 90 tablet, Rfl: 3   FLOWFLEX COVID-19 AG HOME TEST KIT, , Disp: , Rfl:    gabapentin (NEURONTIN) 100 MG capsule, 1-3 caps po tid as needed for lower extremity neuropathic pain (Patient taking differently: 1-3 caps po tid as needed for lower extremity neuropathic pain PRN), Disp: 100 capsule, Rfl: 3   Multiple Vitamin (MULTIVITAMIN) tablet, Take 1 tablet by mouth daily., Disp: , Rfl:    sildenafil (VIAGRA) 100 MG tablet, Take by mouth at bedtime., Disp: , Rfl:    doxycycline (VIBRA-TABS) 100 MG tablet, Take 1 tablet (100 mg total) by mouth 2 (two) times daily., Disp: 20 tablet, Rfl: 0:   Allergies  Allergen Reactions   Atorvastatin Other (See Comments)    Myalgias/arthralgias  :   Family History  Problem Relation Age of Onset   Diabetes Father    Ovarian cancer Sister    Colon cancer Neg Hx    Stomach cancer Neg Hx    Prostate cancer Neg Hx    Breast cancer Neg Hx   :   Social History   Socioeconomic History   Marital status: Married    Spouse name: Rise Paganini   Number of children: 3   Years of education: Not on file   Highest education level: Bachelor's degree (e.g., BA, AB, BS)  Occupational  History   Occupation: FIRE DEPARTMENT    Employer: [employer]    Comment: Retired  Tobacco Use   Smoking status: Never   Smokeless tobacco: Never  Vaping Use   Vaping Use: Never used  Substance and Sexual Activity   Alcohol use: Yes    Alcohol/week: 3.0 standard drinks    Types: 3 Cans of beer per week   Drug use: No   Sexual activity: Yes  Other Topics Concern   Not on file  Social History Narrative   Married, 3 daughters.   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   No T/A/Ds.         Pt is right-handed.  He lives with his wife and three daughters in a two level home. Prior to prostate cancer surgery in 2018, he exercised regularly. He walks now, weather permitting.   Social Determinants of Health   Financial Resource Strain: Not on file  Food Insecurity: Not on file  Transportation Needs: Not on file  Physical Activity: Not on file  Stress: Not on file  Social Connections: Not on file  Intimate Partner Violence: Not on file  :  Pertinent items are noted in HPI.  Exam: Blood pressure 138/80, pulse 66, temperature 97.8 F (36.6 C), temperature source Oral, resp. rate 18, height 6' (1.829 m), weight 207 lb 4.8 oz (94 kg), SpO2 99 %. ECOG 1 General appearance: alert and cooperative appeared without distress. Head: atraumatic without any abnormalities. Eyes: conjunctivae/corneas clear. PERRL.  Sclera anicteric. Throat: lips, mucosa, and tongue normal; without oral thrush or ulcers. Resp: clear to auscultation bilaterally without rhonchi, wheezes or dullness to percussion. Cardio: regular rate and rhythm, S1, S2 normal, no murmur, click, rub or gallop GI: soft, non-tender; bowel sounds normal; no masses,  no organomegaly Skin: Skin color, texture, turgor normal. No rashes or lesions Lymph nodes: Cervical, supraclavicular, and axillary nodes normal. Neurologic: Grossly normal without any motor,  sensory or deep tendon reflexes. Musculoskeletal: No joint deformity or effusion.    Assessment and Plan:    65 year old with:  1.  IgG kappa monoclonal gammopathy detected after presenting with symptoms of neuropathy and no evidence of endorgan damage.  His quantitative immunoglobulins showed normal IgG level.  The differential diagnosis of these findings were discussed at this time.  Monoclonal gammopathy of undetermined significance is likely the diagnosis here.  Multiple myeloma, amyloidosis or other plasma cell disorder associated with neuropathy is unlikely.  At this time, I have recommended active surveillance and repeat protein studies in 6 months.  Bone marrow biopsy and skeletal survey may be needed if his protein studies increase.  His M spike is very small without any evidence of endorgan damage.  2.  Neuropathy: Appears to be idiopathic in nature and unlikely related to plasma cell disorder.  3.  Prostate cancer: Diagnosed with Gleason score 7 in 2018.  He is status post prostatectomy followed by salvage radiation therapy in 2021.  His last PSA is undetectable and currently no need for any additional therapy.  4.  Follow-up: In 6 months for repeat evaluation.  45  minutes were dedicated to this visit. The time was spent on reviewing laboratory data, imaging studies, discussing treatment options, discussing differential diagnosis and answering questions regarding future plan.      A copy of this consult has been forwarded to the requesting physician.

## 2020-12-17 ENCOUNTER — Other Ambulatory Visit: Payer: Self-pay

## 2020-12-17 ENCOUNTER — Ambulatory Visit (INDEPENDENT_AMBULATORY_CARE_PROVIDER_SITE_OTHER): Payer: 59 | Admitting: Family Medicine

## 2020-12-17 ENCOUNTER — Telehealth: Payer: Self-pay | Admitting: Neurology

## 2020-12-17 ENCOUNTER — Encounter: Payer: Self-pay | Admitting: Family Medicine

## 2020-12-17 VITALS — BP 108/73 | HR 88 | Temp 97.7°F | Resp 16 | Ht 72.0 in | Wt 202.6 lb

## 2020-12-17 DIAGNOSIS — R7303 Prediabetes: Secondary | ICD-10-CM | POA: Diagnosis not present

## 2020-12-17 DIAGNOSIS — E7800 Pure hypercholesterolemia, unspecified: Secondary | ICD-10-CM | POA: Diagnosis not present

## 2020-12-17 DIAGNOSIS — G5793 Unspecified mononeuropathy of bilateral lower limbs: Secondary | ICD-10-CM | POA: Diagnosis not present

## 2020-12-17 MED ORDER — GABAPENTIN 100 MG PO CAPS: ORAL_CAPSULE | ORAL | 3 refills | Status: DC

## 2020-12-17 MED ORDER — EZETIMIBE 10 MG PO TABS: 10.0000 mg | ORAL_TABLET | Freq: Every day | ORAL | 3 refills | Status: DC

## 2020-12-17 NOTE — Telephone Encounter (Signed)
Called patient and left a message per DPR to inform him that no labs are needed prior to his appt with Dr. Posey Pronto. Informed patient to give me a call back if he had any questions.

## 2020-12-17 NOTE — Progress Notes (Signed)
OFFICE VISIT  12/17/2020  CC:  Chief Complaint  Patient presents with   Follow-up    RCI, pt is not fasting   HPI:    Patient is a 65 y.o. Caucasian male who presents for 6 mo f/u HLD, prediabetes, chronic bilat LE pain. A/P as of last visit: "1) HLD: intol of atorva and prava.  Recent trial of zetia did not result in any signif change in LDL (04/24/20). Cont zetia, check lipids today and if LDL is up more then I'll add 17m rosuva qod. Admits he needs to cont working on diet + exercise.   2) Health maintenance exam: Reviewed age and gender appropriate health maintenance issues (prudent diet, regular exercise, health risks of tobacco and excessive alcohol, use of seatbelts, fire alarms in home, use of sunscreen).  Also reviewed age and gender appropriate health screening as well as vaccine recommendations. Vaccines: Pt declines covid and flu vaccines.  Tdap and shingrix UTD. Labs: cbc, cmet, a1c. Prostate ca screening: hx of prostate ca->followed closely by urology. Colon ca screening: due for rpt colonoscopy as of now (06/2020).   3) Chronic pain: low back/spondylosis, hx of various leg/feet pains (some sound neuropathic, some sound like fasciitis/bursitis/metatarsalgia). Hx of seeing back surgeon as well as pain mgmt specialist in the past--no surg but ESI's were tried, hard to tell if helped.   Most recently has [place]ina [place]ell[place] He wants to get back with a back doctor and I encouraged him to do this."  INTERIM HX: Doing ok.  Has improved diet regarding starches, has lost 10 lbs last few months.  Since I last saw him he got peripheral nerve bx and it confirmed small fiber neuropathy (Dr. WJacqualyn Posey, some labs were checked (Dr. PPosey Pronto and this turned up MGUS.  He then saw hem/onc (Dr. SAlen Blew who recommends active surveillance with repeat labs q634mond no bone marrow bx unless labs worsening.  He stated he thinks the neuropathy is idiopathic and not related/due to  his MGUS.  Says PT helped his LB and feet pain. Says he is tolerating the pain--hot/burning feet pain, occ pain up legs and low back region.  When up moving it is not as bad. Prolonged sitting and in mornings when wakes up he is worse. Sometimes takes gabapentin in morning, 20019mocc evening dose.  Fasting sugars consistently around 100.  He is using stationary bike 15 min qAM.  ROS as above, plus--> no fevers, no CP, no SOB, no wheezing, no cough, no dizziness, no HAs, no rashes, no melena/hematochezia.  No polyuria or polydipsia.  No myalgias or arthralgias.  No focal weakness, paresthesias, or tremors.  No acute vision or hearing abnormalities.  No dysuria or unusual/new urinary urgency or frequency.  No recent changes in lower legs. No n/v/d or abd pain.  No palpitations.    Past Medical History:  Diagnosis Date   Atypical chest pain 05/2019   Insurance denied stress test I ordered.  Pt chose watchful waiting approach at that time.   CTS (carpal tunnel syndrome)    bilat (NCS/EMG - confirmed 06/12/18)   Diastasis of rectus abdominis    Erectile dysfunction    sildenafil prn helpful   History of kidney stones    bladder   Hyperlipidemia    Intol of atorva and prava.   IFG (impaired fasting glucose)    Per old records.  A1c 5.7% 05/2017.  A1c 5.3% 10/2017. A1c 5.9% 06/2020.   Incisional hernia without obstruction  or gangrene 05/2017   Kidney stone    Lumbar spondylosis    w/pars defect and listhesis (Dr. Saintclair Halsted).  Referred by neurosurg to pain mgmt MD and pt got ESI x 2 ---no help.   Osteoarthritis 2021   1st MTP R foot   Paresthesias    UEs and LE's 2019---recommended neuro referral and pt wanted to think about it.  Then, Dr. Posey Pronto evaluated him 05/2018 and did NCS/EMG-->normal.  The sensation transitioned to BURNING pain->NCS/EMG normal again 09/2020.   Prostate cancer [facility])    Locally advanced prostate ca 06/2016.  Prostatectomy  RAL radical prostatectomy 07/2016.  Biochemical  recurrence 2020/2021--salvage RT+ ADT. Undect PSA 02/2020, 09/2020.   Pulmonary nodule 2009   Stable CT chest 2010--no further scans needed in pt at low risk for lung ca.   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    Past Surgical History:  Procedure Laterality Date   COLONOSCOPY  04/20/11   Normal: recall 10 yrs   ett  approx 2010   normal   KNEE ARTHROSCOPY  2000   left   LYMPHADENECTOMY Bilateral 08/06/2016   Procedure: PELVIC LYMPHADENECTOMY;  Surgeon: Raynelle Bring, MD;  Location: [facility];  Service: Urology;  Laterality: Bilateral;   NCV & EMG  06/02/2018; 06/14/2018; 09/2020   07/03/2018 (LEGS)->NORMAL.  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  09/2020 LEGS NORMAL    REFRACTIVE SURGERY  2000   both eyes   ROBOT ASSISTED LAPAROSCOPIC RADICAL PROSTATECTOMY N/A 08/06/2016   Procedure: Path: adenocarcinoma with extraprostatic extension present, negative margins, lymph nodes NEG. XI ROBOTIC ASSISTED LAPAROSCOPIC RADICAL PROSTATECTOMY LEVEL 2;  Surgeon: Raynelle Bring, MD;  Location: [facility];  Service: Urology;  Laterality: N/A;   ROTATOR CUFF REPAIR  2002   right   ROTATOR CUFF REPAIR  2003   left    Outpatient Medications Prior to Visit  Medication Sig Dispense Refill   Multiple Vitamin (MULTIVITAMIN) tablet Take 1 tablet by mouth daily.     sildenafil (VIAGRA) 100 MG tablet Take by mouth at bedtime.     cetirizine (ZYRTEC) 10 MG tablet Take 10 mg by mouth daily as needed for allergies. (Patient not taking: Reported on 12/17/2020)     doxycycline (VIBRA-TABS) 100 MG tablet Take 1 tablet (100 mg total) by mouth 2 (two) times daily. (Patient not taking: Reported on 12/17/2020) 20 tablet 0   ezetimibe (ZETIA) 10 MG tablet Take 1 tablet (10 mg total) by mouth daily. 90 tablet 3   FLOWFLEX COVID-19 AG HOME TEST KIT  (Patient not taking: Reported on 12/17/2020)     gabapentin  (NEURONTIN) 100 MG capsule 1-3 caps po tid as needed for lower extremity neuropathic pain (Patient taking differently: 1-3 caps po tid as needed for lower extremity neuropathic pain PRN) 100 capsule 3   No facility-administered medications prior to visit.    Allergies  Allergen Reactions   Atorvastatin Other (See Comments)    Myalgias/arthralgias    ROS As per HPI  PE: Vitals with BMI 12/17/2020 11/28/2020 11/14/2020  Height _0  _1  _2   Weight 202 lbs 10 oz 207 lbs 5 oz 209 lbs  BMI 27.47 29.51 88.41  Systolic 660 630 160  Diastolic 73 80 82  Pulse 88 66 76     Gen: Alert, well appearing.  Patient is oriented to person, place, time, and situation. AFFECT: pleasant, lucid thought and  speech. LEGS: no erythema or swelling. Normal sensation (fine touch, vibratory, position sense).  Cannot elicit achilles DTR on either side. Strength in legs/feet/toes normal.  No muscle atrophy.   LABS:  Lab Results  Component Value Date   TSH 1.83 11/14/2020   Lab Results  Component Value Date   WBC 5.4 06/20/2020   HGB 13.8 06/20/2020   HCT 39.8 06/20/2020   MCV 89.8 06/20/2020   PLT 287 06/20/2020   Lab Results  Component Value Date   CREATININE 0.89 06/20/2020   BUN 17 06/20/2020   NA 141 06/20/2020   K 4.5 06/20/2020   CL 107 06/20/2020   CO2 26 06/20/2020   Lab Results  Component Value Date   ALT 23 06/20/2020   AST 18 06/20/2020   ALKPHOS 60 01/06/2017   BILITOT 0.6 06/20/2020   Lab Results  Component Value Date   CHOL 183 06/20/2020   Lab Results  Component Value Date   HDL 53 06/20/2020   Lab Results  Component Value Date   LDLCALC 114 (H) 06/20/2020   Lab Results  Component Value Date   TRIG 70 06/20/2020   Lab Results  Component Value Date   CHOLHDL 3.5 06/20/2020   Lab Results  Component Value Date   PSA <0.015 09/19/2020   PSA 0.067 01/23/2019   PSA 0.033 06/15/2018   Lab Results  Component Value Date   HGBA1C 5.9 06/20/2020    IMPRESSION AND PLAN:  1) HLD: tolerating zetia.   Hx of intol to atorva and prava. Pt does not want further statin trial at this time.  In fact, he wants to continue improving diet and exercise and has hopes of getting off zetia in the future. Return for fasting lipid at earliest convenience. Recent hepatic panel was normal.  2) Prediabetes: home glucoses good. Return for Hba1c.  3) Chronic neuropathic pain in feet-->has biopsy-confirmed small fiber neuropathy, suspect idiopathic. He'll consider pushing gabapentin dosing some--300-600 bid to tid.  4) MGUS: f/u hem/onc q63mo labs surveillance per hem/onc.  An After Visit Summary was printed and given to the patient.  FOLLOW UP: Return in about 6 months (around 06/19/2021) for annual CPE (fasting). Next cpe 6 mo  Signed:  PCrissie Sickles MD           12/17/2020

## 2020-12-17 NOTE — Telephone Encounter (Signed)
Pt called in after noticing he had a missed appointment for lab work on 11/14/20 while looking in [place]. He would like to make sure he didn't need to get any labs done before his next appointment on 02/26/21?

## 2020-12-17 NOTE — Telephone Encounter (Signed)
He does not need any labs prior to his next visit.

## 2020-12-18 ENCOUNTER — Ambulatory Visit: Payer: 59

## 2020-12-19 ENCOUNTER — Ambulatory Visit (INDEPENDENT_AMBULATORY_CARE_PROVIDER_SITE_OTHER): Payer: 59

## 2020-12-19 ENCOUNTER — Other Ambulatory Visit: Payer: Self-pay

## 2020-12-19 DIAGNOSIS — E7800 Pure hypercholesterolemia, unspecified: Secondary | ICD-10-CM

## 2020-12-19 DIAGNOSIS — R7303 Prediabetes: Secondary | ICD-10-CM

## 2020-12-20 LAB — HEMOGLOBIN A1C
Hgb A1c MFr Bld: 5.5 % of total Hgb (ref <5.7)
Mean Plasma Glucose: 111 mg/dL
eAG (mmol/L): 6.2 mmol/L

## 2020-12-20 LAB — LIPID PANEL
Cholesterol: 167 mg/dL (ref <200)
HDL: 58 mg/dL (ref >=40)
LDL Cholesterol (Calc): 95 mg/dL (calc)
Non-HDL Cholesterol (Calc): 109 mg/dL (calc) (ref <130)
Total CHOL/HDL Ratio: 2.9 (calc) (ref <5.0)
Triglycerides: 46 mg/dL (ref <150)

## 2021-01-02 IMAGING — CR DG CHEST 2V
2 series · 2 of 2 positions shown · non-contrast
Comparison: 12/02/2016

CLINICAL DATA: Chest pain for 3 weeks, no improvement following
albuterol and doxycycline, pressure behind heart, former smoker,
history prostate cancer

EXAM:
CHEST - 2 VIEW

[chest pa]
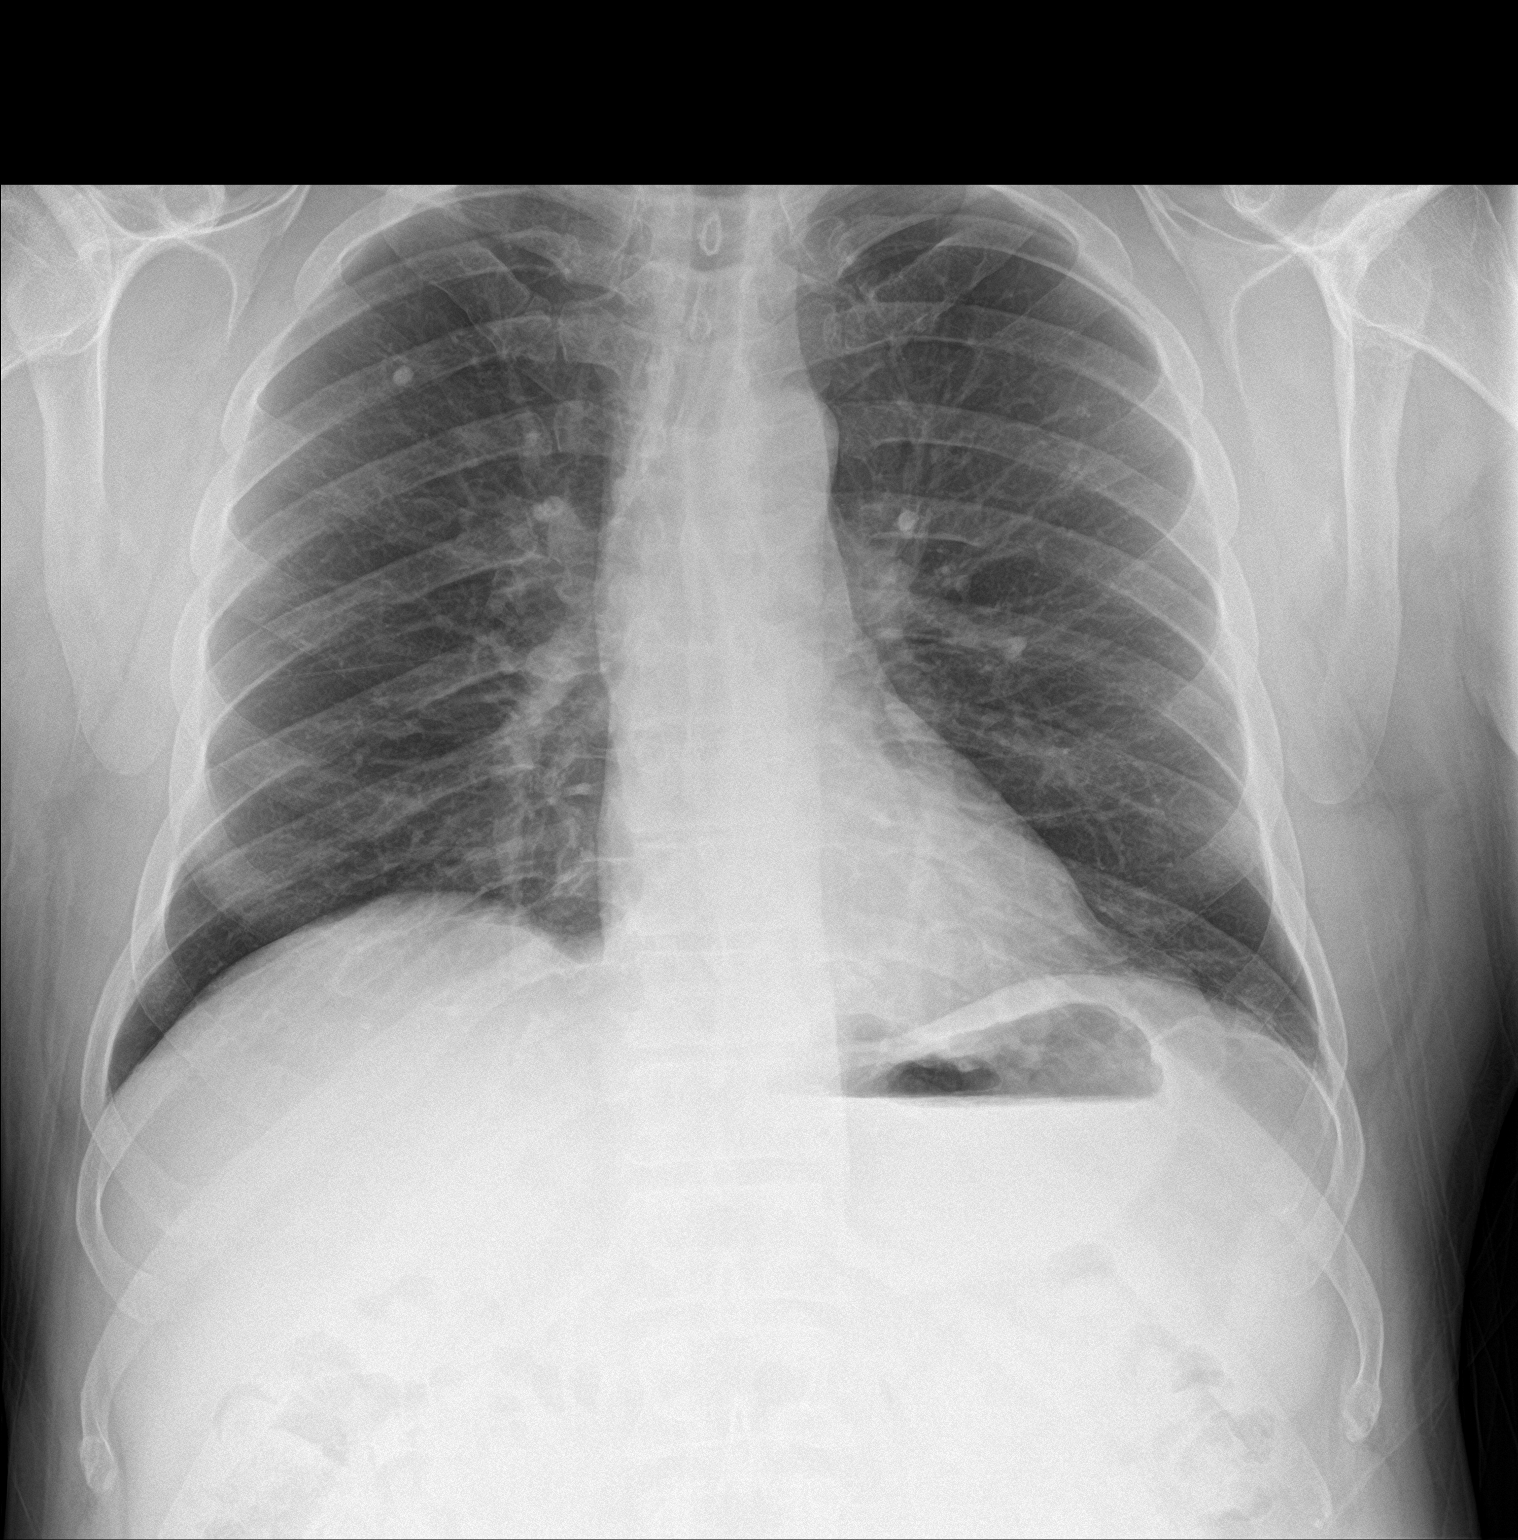

[chest lat]
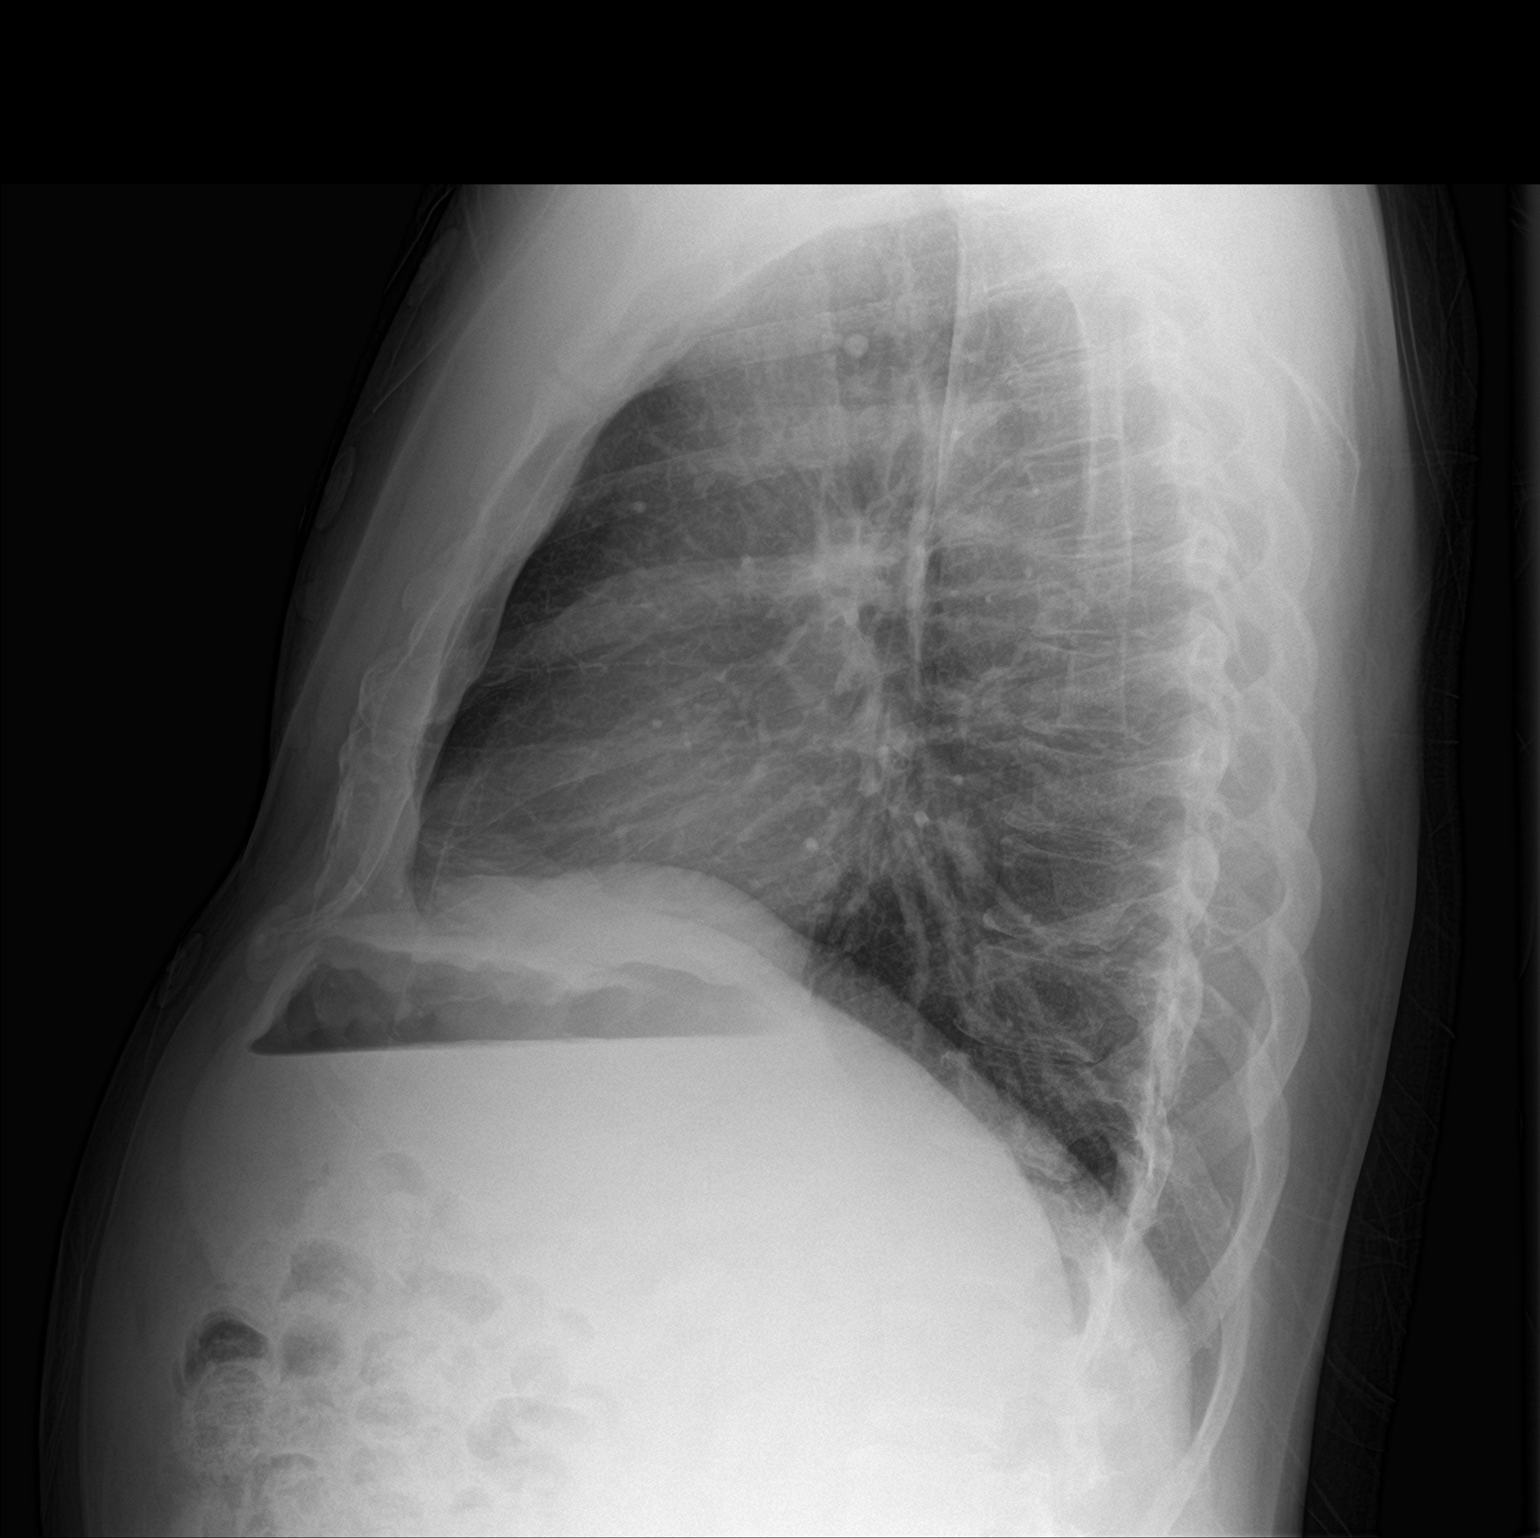

[2 of 2 positions shown; findings below may reference images not displayed]

FINDINGS: Normal heart size, mediastinal contours, and pulmonary vascularity.

Calcified granuloma RIGHT upper lobe.

Lungs otherwise clear.

No pulmonary infiltrate, pleural effusion, or pneumothorax.

Osseous structures unremarkable.
IMPRESSION: No acute abnormalities.

## 2021-02-26 ENCOUNTER — Other Ambulatory Visit: Payer: Self-pay

## 2021-02-26 ENCOUNTER — Encounter: Payer: Self-pay | Admitting: Neurology

## 2021-02-26 ENCOUNTER — Ambulatory Visit (INDEPENDENT_AMBULATORY_CARE_PROVIDER_SITE_OTHER): Payer: 59 | Admitting: Neurology

## 2021-02-26 VITALS — BP 126/81 | HR 76 | Ht 72.0 in | Wt 200.0 lb

## 2021-02-26 DIAGNOSIS — M545 Low back pain, unspecified: Secondary | ICD-10-CM

## 2021-02-26 DIAGNOSIS — G8929 Other chronic pain: Secondary | ICD-10-CM | POA: Diagnosis not present

## 2021-02-26 DIAGNOSIS — M5417 Radiculopathy, lumbosacral region: Secondary | ICD-10-CM | POA: Diagnosis not present

## 2021-02-26 NOTE — Progress Notes (Signed)
Follow-up Visit   Date: 02/26/21   Rodney Watkins MRN: [id number] DOB: 04/07/1956   Interim History: Rodney Watkins is a 65 y.o. right-handed Caucasian male with hyperlipidemia, lumbar spondylosis, prostate cancer s/p robotic prostatectomy returning to the clinic for follow-up of small fiber neuropathy. He is a retired Airline pilot.  The patient was accompanied to the clinic by self.  History of present illness: In 01/2015, he was on an elliptical machine and began having hip pain.  He was evaluated by orthopaedic surgery and was found to have bilateral L5 pars defect which was radiating to his hip. He was referred to Dr. Saintclair Halsted who did not find any role for surgery and referred to Dr. Brien Few for pain management.    In January 2018, he was diagnosed with prostate cancer and prostatectomy.  Following his surgery, he could not raise his right leg which was thought to be secondary to obturator nerve injury.  He was also having pain from his going into right medial thigh.  He has tried epidural steroid injections, dry needling, and chiropractic adjustments with no benefit.  He was having some initial improvement with physical therapy with respect to right leg weakness, however he exercises aggravated his low back pain so this was stopped.  He also complains of generalized tingling of the feet and bilateral arms.  He is concerned that he cannot run and feels that there is lack of coordination.  He has no problems with walking or jogging.    He complaints of bilateral leg pain and tingling of the feet.  He is able to walk and job, but cannot run because he feels that his coordination is not there.  He complains of sharp pain from the groin into his medial nerve.    He has not history diabetes.  He drinks beer over the weekend.    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   He was evaluated here in 2020.  NCS/EMG of the legs is normal, he has mild carpal tunnel syndrome.    In June 2021, his prostate cancer returned. He underwent radiation treatment.  In August 2021, he began having severe bilateral feet, severe pain, limited him from walking and putting pressure on the feet.  In October, pain transitioned into burning pain and a month later, he developed swelling in the feet and ankle. Pain is always worse after he has been on his feet for sometime.  He was referred to see Dr. [name], Sports Medicine and to see me.  He denies any numbness/tingling of the feet. No weakness or falls. He also complains of chronic low back pain.    UPDATE 11/14/2020:  He recently had skin biopsy which confirmed the presence of small fiber neuropathy.  He continues to complain to burning pain in the feet.  He takes gabapentin 141m as needed because of sedation.  He does not want to take medication and is hoping to be able to fix the neuropathy. He also complains of achy low back pain, hip pain, diarrhea, and blurred vision.  He has seen an eye doctor.  He completed PT in the past for his back, which helped.    UPDATE 02/26/2021:  He is here for follow-up of small fiber neuropathy.  Last time he was here, I checked a number of labs to look for potential cause, which returned normal, except for M spike with IgG kappa.  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  He would like to know what his causing his neuropathy.  For his paresthesias, he takes gabapentin 219m in the morning and noticed that the burning sensation in his feet as improved and does not occur daily. He completed PT for low back pain which has helped, but now he has left sided hip pain which radiates into his medial thigh.  He tells me that his low back issues presented like this in the past.      Medications:  Current Outpatient Medications on File Prior to Visit  Medication Sig Dispense Refill   cetirizine (ZYRTEC) 10 MG tablet Take 10 mg by mouth daily as needed for allergies.     ezetimibe (ZETIA) 10 MG tablet  Take 1 tablet (10 mg total) by mouth daily. 90 tablet 3   gabapentin (NEURONTIN) 100 MG capsule 1-3 caps po tid as needed for lower extremity neuropathic pain 180 capsule 3   Multiple Vitamin (MULTIVITAMIN) tablet Take 1 tablet by mouth daily.     sildenafil (VIAGRA) 100 MG tablet Take by mouth at bedtime.     No current facility-administered medications on file prior to visit.    Allergies:  Allergies  Allergen Reactions   Atorvastatin Other (See Comments)    Myalgias/arthralgias    Vital Signs:  BP 126/81   Pulse 76   Ht 6' (1.829 m)   Wt 200 lb (90.7 kg)   SpO2 97%   BMI 27.12 kg/m   Neurological Exam: MENTAL STATUS including orientation to time, place, person, recent and remote memory, attention span and concentration, language, and fund of knowledge is normal.  Speech is not dysarthric.  CRANIAL NERVES: Pupils equal round and reactive to light.  Normal conjugate, extra-ocular eye movements in all directions of gaze.  No ptosis.   MOTOR:  Motor strength is 5/5 in all extremities.  No atrophy, fasciculations or abnormal movements.  No pronator drift.  Tone is normal.    MSRs:  Reflexes are 2+/4 throughout.  SENSORY:  Intact to vibration and temperature throughout.  COORDINATION/GAIT:   Gait narrow based and stable.    Data:  MRI lumbar spine 07/09/2016: No change since 2016. 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 Degeneration and bulging of the disc. Mild foraminal narrowing bilaterally without visible compression of the exiting L5 nerve roots.  NCS/EMG of the legs 06/02/2018:  Normal  NCS/EMG of the arms 06/14/2018:  Bilateral median neuropathy at or distal to the wrist, consistent with a clinical diagnosis of carpal tunnel syndrome.  Overall, these findings are mild in degree electrically.  NCS/EMG of the legs 09/17/2020:  This is a normal study of the lower extremities.  Of note, there has been no significant change as compared to prior study  on 06/02/2018.  In particular, there is no evidence of a sensorimotor polyneuropathy or lumbosacral radiculopathy   Skin biopsy 10/16/2020:  Intraepidermal nerve fiber density moderately reduced at the right calf, severely reduced at the left consistent with small fiber neuropathy  Lab Results  Component Value Date   HGBA1C 5.5 12/19/2020   Lab Results  Component Value Date   TSH 1.83 11/14/2020   Lab Results  Component Value Date   VITAMINB12 314 11/14/2020   Labs 11/14/2020:  CRP <1.0, vitamin B12 314, folate > 24, TSH 1.83, ESR 8, celiac panel neg, vitamin B1 27, SPEP with IFE IgG kappa M protein*, copper 84, ANA neg,   IMPRESSION/PLAN: Small fiber neuropathy affecting the feet, painful., stable.  NCS/EMG without large fiber neuropathy.   - Labs looking at etiology has been negative, except for IgG kappa gammopathy which is not felt to be associated with neuropathy - Continue gabapentin 256m daily, which provides adequate relief.   He does not wish to take it at bedtime because of fear he may not be able to get up to urinate - He would like a second opinion regarding the underlying cause for his neuropathy.  I explained that despite looking for the cause, many times the underlying etiology is not always identified and management is supportive.  2.  Left hip and leg pain, no improvement with PT.  - MRI lumbar spine to evaluate for lumbosacral disease  - Follow-up with Dr. BBrien Few if pain persists   Thank you for allowing me to participate in patient's care.  If I can answer any additional questions, I would be pleased to do so.    Sincerely,    Ashe Graybeal K. PPosey Pronto DO

## 2021-02-26 NOTE — Patient Instructions (Signed)
MRI lumbar spine will be ordered.  [facility] will contact you to schedule this.  We will refer you to [facility] [place] for second opinion on the cause of your small fiber neuropathy  Continue gabapentin 200mg  daily

## 2021-03-07 ENCOUNTER — Telehealth: Payer: Self-pay | Admitting: Neurology

## 2021-03-07 NOTE — Telephone Encounter (Signed)
Per [facility], mri is authorized awaiting to be scheduled. Contacted patient had to leave voicemail. He can call and schedule or they will reach out to patient. Thank you

## 2021-03-07 NOTE — Telephone Encounter (Signed)
Pt would like to know if his ins has approved him for his MRI. They called to sch him but he doesn't want to unless his ins is approved.

## 2021-03-10 ENCOUNTER — Encounter: Payer: Self-pay | Admitting: Gastroenterology

## 2021-03-13 ENCOUNTER — Ambulatory Visit
Admission: RE | Admit: 2021-03-13 | Discharge: 2021-03-13 | Disposition: A | Discharge status: Home / Self Care | Payer: 59 | Source: Ambulatory Visit | Attending: Neurology | Admitting: Neurology

## 2021-03-13 ENCOUNTER — Other Ambulatory Visit: Payer: Self-pay

## 2021-03-13 DIAGNOSIS — M5417 Radiculopathy, lumbosacral region: Secondary | ICD-10-CM

## 2021-03-13 DIAGNOSIS — G8929 Other chronic pain: Secondary | ICD-10-CM

## 2021-03-13 DIAGNOSIS — M545 Low back pain, unspecified: Secondary | ICD-10-CM

## 2021-04-23 ENCOUNTER — Ambulatory Visit (AMBULATORY_SURGERY_CENTER): Payer: 59 | Admitting: *Deleted

## 2021-04-23 ENCOUNTER — Other Ambulatory Visit: Payer: Self-pay

## 2021-04-23 VITALS — Ht 72.0 in | Wt 200.0 lb

## 2021-04-23 DIAGNOSIS — Z1211 Encounter for screening for malignant neoplasm of colon: Secondary | ICD-10-CM

## 2021-04-23 MED ORDER — NA SULFATE-K SULFATE-MG SULF 17.5-3.13-1.6 GM/177ML PO SOLN
1.0000 | ORAL | 0 refills | Status: DC
Start: 2021-04-23 — End: 2021-05-14

## 2021-04-23 NOTE — Progress Notes (Signed)
Patient's pre-visit was done today over the phone with the patient. Name,DOB and address verified. Patient denies any allergies to Eggs and Soy. Patient denies any problems with anesthesia/sedation. Patient is not taking any diet pills or blood thinners. No home Oxygen. Packet of Prep instructions mailed to patient including a copy of a consent form-pt is aware. Prep instructions sent to pt's MyChart (if activated).Patient understands to call us back with any questions or concerns. Patient is aware of our care-partner policy and HUTML-46 safety protocol. Patient will use Suprep-pt aware of cost.  EMMI education assigned to the patient for the procedure, sent to [facility].

## 2021-05-08 ENCOUNTER — Encounter: Payer: Self-pay | Admitting: Gastroenterology

## 2021-05-14 ENCOUNTER — Other Ambulatory Visit: Payer: Self-pay

## 2021-05-14 ENCOUNTER — Ambulatory Visit (AMBULATORY_SURGERY_CENTER): Payer: 59 | Admitting: Gastroenterology

## 2021-05-14 ENCOUNTER — Encounter: Payer: Self-pay | Admitting: Gastroenterology

## 2021-05-14 VITALS — BP 117/78 | HR 74 | Temp 98.7°F | Resp 13 | Ht 72.0 in | Wt 200.0 lb

## 2021-05-14 DIAGNOSIS — K635 Polyp of colon: Secondary | ICD-10-CM

## 2021-05-14 DIAGNOSIS — D122 Benign neoplasm of ascending colon: Secondary | ICD-10-CM

## 2021-05-14 DIAGNOSIS — Z1211 Encounter for screening for malignant neoplasm of colon: Secondary | ICD-10-CM | POA: Diagnosis present

## 2021-05-14 MED ORDER — SODIUM CHLORIDE 0.9 % IV SOLN: 500.0000 mL | Freq: Once | INTRAVENOUS | Status: DC

## 2021-05-14 NOTE — Progress Notes (Signed)
Pt's states no medical or surgical changes since previsit or office visit. VS assessed by D.T 

## 2021-05-14 NOTE — Op Note (Signed)
[facility] Patient Name: Rodney Watkins Procedure Date: 05/14/2021 10:32 AM MRN: [id number] Endoscopist: Milus Banister , MD Age: 66 Referring MD:  Date of Birth: [birth year] Gender: Male Account #: [id number] Procedure:                Colonoscopy Indications:              Screening for colorectal malignant neoplasm Medicines:                Monitored Anesthesia Care Procedure:                Pre-Anesthesia Assessment:                           - Prior to the procedure, a History and Physical                            was performed, and patient medications and                            allergies were reviewed. The patient's tolerance of                            previous anesthesia was also reviewed. The risks                            and benefits of the procedure and the sedation                            options and risks were discussed with the patient.                            All questions were answered, and informed consent                            was obtained. Prior Anticoagulants: The patient has                            taken no previous anticoagulant or antiplatelet                            agents. ASA Grade Assessment: II - A patient with                            mild systemic disease. After reviewing the risks                            and benefits, the patient was deemed in                            satisfactory condition to undergo the procedure.                           After obtaining informed consent, the colonoscope  was passed under direct vision. Throughout the                            procedure, the patient's blood pressure, pulse, and                            oxygen saturations were monitored continuously. The                            Olympus CF-HQ190L ([id number]) Colonoscope was                            introduced through the anus and advanced to the the                            cecum, identified by  appendiceal orifice and                            ileocecal valve. The colonoscopy was performed                            without difficulty. The patient tolerated the                            procedure well. The quality of the bowel                            preparation was good. The ileocecal valve,                            appendiceal orifice, and rectum were photographed. Scope In: 10:42:55 AM Scope Out: 10:54:32 AM Scope Withdrawal Time: 0 hours 9 minutes 13 seconds  Total Procedure Duration: 0 hours 11 minutes 37 seconds  Findings:                 A 4 mm polyp was found in the ascending colon. The                            polyp was sessile. The polyp was removed with a                            cold snare. Resection and retrieval were complete.                           External and internal hemorrhoids were found. The                            hemorrhoids were medium-sized.                           The exam was otherwise without abnormality on                            direct and retroflexion views. Complications:  No immediate complications. Estimated blood loss:                            None. Estimated Blood Loss:     Estimated blood loss: none. Impression:               - One 4 mm polyp in the ascending colon, removed                            with a cold snare. Resected and retrieved.                           - External and internal hemorrhoids.                           - The examination was otherwise normal on direct                            and retroflexion views. Recommendation:           - Patient has a contact number available for                            emergencies. The signs and symptoms of potential                            delayed complications were discussed with the                            patient. Return to normal activities tomorrow.                            Written discharge instructions were provided to the                             patient.                           - Resume previous diet.                           - Continue present medications.                           - Await pathology results. Milus Banister, MD 05/14/2021 10:56:54 AM This report has been signed electronically.

## 2021-05-14 NOTE — Patient Instructions (Signed)
Thank you for letting us take care of your healthcare needs today. Please see handouts given to you on Polyps and Hemorrhoids.     YOU HAD AN ENDOSCOPIC PROCEDURE TODAY AT THE [facility]:   Refer to the procedure report that was given to you for any specific questions about what was found during the examination.  If the procedure report does not answer your questions, please call your gastroenterologist to clarify.  If you requested that your care partner not be given the details of your procedure findings, then the procedure report has been included in a sealed envelope for you to review at your convenience later.  YOU SHOULD EXPECT: Some feelings of bloating in the abdomen. Passage of more gas than usual.  Walking can help get rid of the air that was put into your GI tract during the procedure and reduce the bloating. If you had a lower endoscopy (such as a colonoscopy or flexible sigmoidoscopy) you may notice spotting of blood in your stool or on the toilet paper. If you underwent a bowel prep for your procedure, you may not have a normal bowel movement for a few days.  Please Note:  You might notice some irritation and congestion in your nose or some drainage.  This is from the oxygen used during your procedure.  There is no need for concern and it should clear up in a day or so.  SYMPTOMS TO REPORT IMMEDIATELY:  Following lower endoscopy (colonoscopy or flexible sigmoidoscopy):  Excessive amounts of blood in the stool  Significant tenderness or worsening of abdominal pains  Swelling of the abdomen that is new, acute  Fever of 100F or higher  For urgent or emergent issues, a gastroenterologist can be reached at any hour by calling [phone]. Do not use MyChart messaging for urgent concerns.    DIET:  We do recommend a small meal at first, but then you may proceed to your regular diet.  Drink plenty of fluids but you should avoid alcoholic beverages for 24  hours.  ACTIVITY:  You should plan to take it easy for the rest of today and you should NOT DRIVE or use heavy machinery until tomorrow (because of the sedation medicines used during the test).    FOLLOW UP: Our staff will call the number listed on your records 48-72 hours following your procedure to check on you and address any questions or concerns that you may have regarding the information given to you following your procedure. If we do not reach you, we will leave a message.  We will attempt to reach you two times.  During this call, we will ask if you have developed any symptoms of COVID 19. If you develop any symptoms (ie: fever, flu-like symptoms, shortness of breath, cough etc.) before then, please call [phone].  If you test positive for Covid 19 in the 2 weeks post procedure, please call and report this information to us.    If any biopsies were taken you will be contacted by phone or by letter within the next 1-3 weeks.  Please call us at [phone] if you have not heard about the biopsies in 3 weeks.    SIGNATURES/CONFIDENTIALITY: You and/or your care partner have signed paperwork which will be entered into your electronic medical record.  These signatures attest to the fact that that the information above on your After Visit Summary has been reviewed and is understood.  Full responsibility of the confidentiality of this discharge information   lies with you and/or your care-partner.  

## 2021-05-14 NOTE — Progress Notes (Signed)
Report to PACU, RN, vss, BBS= Clear.  

## 2021-05-14 NOTE — Progress Notes (Signed)
Called to room to assist during endoscopic procedure.  Patient ID and intended procedure confirmed with present staff. Received instructions for my participation in the procedure from the performing physician.  

## 2021-05-14 NOTE — Progress Notes (Signed)
HPI: This is a man at routine risk for CRC   ROS: complete GI ROS as described in HPI, all other review negative.  Constitutional:  No unintentional weight loss   Past Medical History:  Diagnosis Date   Atypical chest pain 05/2019   Insurance denied stress test I ordered.  Pt chose watchful waiting approach at that time.   CTS (carpal tunnel syndrome)    bilat (NCS/EMG - confirmed 06/12/18)   Diastasis of rectus abdominis    Erectile dysfunction    sildenafil prn helpful   History of kidney stones    bladder   Hyperlipidemia    Intol of atorva and prava.   IFG (impaired fasting glucose)    Per old records.  A1c 5.7% 05/2017.  A1c 5.3% 10/2017. A1c 5.9% 06/2020.   Incisional hernia without obstruction or gangrene 05/2017   Kidney stone    Lumbar spondylosis    w/pars defect and listhesis (Dr. Saintclair Halsted).  Referred by neurosurg to pain mgmt MD and pt got ESI x 2 ---no help.   Osteoarthritis 2021   1st MTP R foot   Paresthesias    UEs and LE's 2019---recommended neuro referral and pt wanted to think about it.  Then, Dr. Posey Pronto evaluated him 05/2018 and did NCS/EMG-->normal.  The sensation transitioned to BURNING pain->NCS/EMG normal again 09/2020.   Prostate cancer [facility])    Locally advanced prostate ca 06/2016.  Prostatectomy  RAL radical prostatectomy 07/2016.  Biochemical recurrence 2020/2021--salvage RT+ ADT. Undect PSA 02/2020, 09/2020.   Pulmonary nodule 2009   Stable CT chest 2010--no further scans needed in pt at low risk for lung ca.   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    Past Surgical History:  Procedure Laterality Date   COLONOSCOPY  04/20/11   Normal: recall 10 yrs   ett  approx 2010   normal   KNEE ARTHROSCOPY  2000   left   LYMPHADENECTOMY Bilateral 08/06/2016   Procedure: PELVIC LYMPHADENECTOMY;  Surgeon: Raynelle Bring, MD;  Location: [facility];  Service: Urology;   Laterality: Bilateral;   NCV & EMG  06/02/2018; 06/14/2018; 09/2020   07/03/2018 (LEGS)->NORMAL.  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  09/2020 LEGS NORMAL    REFRACTIVE SURGERY  2000   both eyes   ROBOT ASSISTED LAPAROSCOPIC RADICAL PROSTATECTOMY N/A 08/06/2016   Procedure: Path: adenocarcinoma with extraprostatic extension present, negative margins, lymph nodes NEG. XI ROBOTIC ASSISTED LAPAROSCOPIC RADICAL PROSTATECTOMY LEVEL 2;  Surgeon: Raynelle Bring, MD;  Location: [facility];  Service: Urology;  Laterality: N/A;   ROTATOR CUFF REPAIR  2002   right   ROTATOR CUFF REPAIR  2003   left    Current Outpatient Medications  Medication Sig Dispense Refill   ezetimibe (ZETIA) 10 MG tablet Take 1 tablet (10 mg total) by mouth daily. 90 tablet 3   gabapentin (NEURONTIN) 100 MG capsule 1-3 caps po tid as needed for lower extremity neuropathic pain 180 capsule 3   Multiple Vitamin (MULTIVITAMIN) tablet Take 1 tablet by mouth daily.     cetirizine (ZYRTEC) 10 MG tablet Take 10 mg by mouth daily as needed for allergies.     sildenafil (VIAGRA) 100 MG tablet Take by mouth at bedtime. (Patient not taking: Reported on 05/14/2021)     Current Facility-Administered Medications  Medication Dose Route Frequency Provider Last Rate Last Admin   0.9 %  sodium chloride infusion  500 mL Intravenous Once Milus Banister, MD        Allergies as of 05/14/2021 - Review Complete 05/14/2021  Allergen Reaction Noted   Atorvastatin Other (See Comments) 01/18/2019    Family History  Problem Relation Age of Onset   Diabetes Father    Ovarian cancer Sister    Colon cancer Neg Hx    Stomach cancer Neg Hx    Prostate cancer Neg Hx    Breast cancer Neg Hx     Social History   Socioeconomic History   Marital status: Married    Spouse name: Rise Paganini   Number of children: 3   Years of education: Not on file   Highest education level: Bachelor's degree (e.g., BA, AB, BS)  Occupational History    Occupation: FIRE DEPARTMENT    Employer: [employer]    Comment: Retired  Tobacco Use   Smoking status: Never   Smokeless tobacco: Never  Vaping Use   Vaping Use: Never used  Substance and Sexual Activity   Alcohol use: Yes    Alcohol/week: 3.0 standard drinks    Types: 3 Cans of beer per week   Drug use: No   Sexual activity: Yes  Other Topics Concern   Not on file  Social History Narrative   Married, 3 daughters.   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   No T/A/Ds.         Pt is right-handed. He lives with his wife and three daughters in a two level home. Prior to prostate cancer surgery in 2018, he exercised regularly. He walks now, weather permitting.   Social Determinants of Health   Financial Resource Strain: Not on file  Food Insecurity: Not on file  Transportation Needs: Not on file  Physical Activity: Not on file  Stress: Not on file  Social Connections: Not on file  Intimate Partner Violence: Not on file     Physical Exam: BP (!) 143/99    Pulse 63    Temp 98.7 F (37.1 C) (Skin)    Resp 17    Ht 6' (1.829 m)    Wt 200 lb (90.7 kg)    SpO2 99%    BMI 27.12 kg/m  Constitutional: generally well-appearing Psychiatric: alert and oriented x3 Lungs: CTA bilaterally Heart: no MCR  Assessment and plan: 66 y.o. male with routine risk for CRC  Screening Colonoscoy today  Care is appropriate for the ambulatory setting.  Owens Loffler, MD [facility] 05/14/2021, 10:35 AM

## 2021-05-16 ENCOUNTER — Telehealth: Payer: Self-pay | Admitting: *Deleted

## 2021-05-16 NOTE — Telephone Encounter (Signed)
°  Follow up Call-  Call back number 05/14/2021  Post procedure Call Back phone  # [phone]  Permission to leave phone message Yes  Some recent data might be hidden     Patient questions:  Do you have a fever, pain , or abdominal swelling? No. Pain Score  0 *  Have you tolerated food without any problems? Yes.    Have you been able to return to your normal activities? Yes.    Do you have any questions about your discharge instructions: Diet   No. Medications  No. Follow up visit  No.  Do you have questions or concerns about your Care? No.  Actions: * If pain score is 4 or above: No action needed, pain <4.

## 2021-05-19 ENCOUNTER — Encounter: Payer: Self-pay | Admitting: Gastroenterology

## 2021-05-30 ENCOUNTER — Other Ambulatory Visit: Payer: Self-pay

## 2021-05-30 ENCOUNTER — Inpatient Hospital Stay: Payer: 59 | Attending: Oncology

## 2021-05-30 DIAGNOSIS — D472 Monoclonal gammopathy: Secondary | ICD-10-CM | POA: Insufficient documentation

## 2021-05-30 DIAGNOSIS — G629 Polyneuropathy, unspecified: Secondary | ICD-10-CM | POA: Insufficient documentation

## 2021-05-30 DIAGNOSIS — C61 Malignant neoplasm of prostate: Secondary | ICD-10-CM | POA: Diagnosis not present

## 2021-05-30 LAB — CMP (CANCER CENTER ONLY)
ALT: 15 U/L (ref 0–44)
AST: 16 U/L (ref 15–41)
Albumin: 4.2 g/dL (ref 3.5–5.0)
Alkaline Phosphatase: 48 U/L (ref 38–126)
Anion gap: 6 (ref 5–15)
BUN: 20 mg/dL (ref 8–23)
CO2: 23 mmol/L (ref 22–32)
Calcium: 9.5 mg/dL (ref 8.9–10.3)
Chloride: 109 mmol/L (ref 98–111)
Creatinine: 0.86 mg/dL (ref 0.61–1.24)
GFR, Estimated: >60 mL/min (ref >60)
Glucose, Bld: 123 mg/dL — ABNORMAL HIGH (ref 70–99)
Potassium: 4.2 mmol/L (ref 3.5–5.1)
Sodium: 138 mmol/L (ref 135–145)
Total Bilirubin: 0.5 mg/dL (ref 0.3–1.2)
Total Protein: 6.8 g/dL (ref 6.5–8.1)

## 2021-05-30 LAB — CBC WITH DIFFERENTIAL (CANCER CENTER ONLY)
Abs Immature Granulocytes: 0.01 10*3/uL (ref 0.00–0.07)
Basophils Absolute: 0.1 10*3/uL (ref 0.0–0.1)
Basophils Relative: 1 %
Eosinophils Absolute: 0.1 10*3/uL (ref 0.0–0.5)
Eosinophils Relative: 2 %
HCT: 41.4 % (ref 39.0–52.0)
Hemoglobin: 13.8 g/dL (ref 13.0–17.0)
Immature Granulocytes: 0 %
Lymphocytes Relative: 16 %
Lymphs Abs: 0.7 10*3/uL (ref 0.7–4.0)
MCH: 30.5 pg (ref 26.0–34.0)
MCHC: 33.3 g/dL (ref 30.0–36.0)
MCV: 91.6 fL (ref 80.0–100.0)
Monocytes Absolute: 0.4 10*3/uL (ref 0.1–1.0)
Monocytes Relative: 11 %
Neutro Abs: 2.9 10*3/uL (ref 1.7–7.7)
Neutrophils Relative %: 70 %
Platelet Count: 236 10*3/uL (ref 150–400)
RBC: 4.52 MIL/uL (ref 4.22–5.81)
RDW: 13.8 % (ref 11.5–15.5)
WBC Count: 4.1 10*3/uL (ref 4.0–10.5)
nRBC: 0 % (ref 0.0–0.2)

## 2021-06-02 LAB — KAPPA/LAMBDA LIGHT CHAINS
Kappa free light chain: 17.4 mg/L (ref 3.3–19.4)
Kappa, lambda light chain ratio: 1.58 (ref 0.26–1.65)
Lambda free light chains: 11 mg/L (ref 5.7–26.3)

## 2021-06-04 LAB — MULTIPLE MYELOMA PANEL, SERUM
Albumin SerPl Elph-Mcnc: 3.8 g/dL (ref 2.9–4.4)
Albumin/Glob SerPl: 1.5 (ref 0.7–1.7)
Alpha 1: 0.2 g/dL (ref 0.0–0.4)
Alpha2 Glob SerPl Elph-Mcnc: 0.6 g/dL (ref 0.4–1.0)
B-Globulin SerPl Elph-Mcnc: 0.9 g/dL (ref 0.7–1.3)
Gamma Glob SerPl Elph-Mcnc: 1 g/dL (ref 0.4–1.8)
Globulin, Total: 2.6 g/dL (ref 2.2–3.9)
IgA: 51 mg/dL — ABNORMAL LOW (ref 61–437)
IgG (Immunoglobin G), Serum: 968 mg/dL (ref 603–1613)
IgM (Immunoglobulin M), Srm: 34 mg/dL (ref 20–172)
M Protein SerPl Elph-Mcnc: 0.6 g/dL — ABNORMAL HIGH
Total Protein ELP: 6.4 g/dL (ref 6.0–8.5)

## 2021-06-06 ENCOUNTER — Inpatient Hospital Stay (HOSPITAL_BASED_OUTPATIENT_CLINIC_OR_DEPARTMENT_OTHER): Payer: 59 | Admitting: Oncology

## 2021-06-06 ENCOUNTER — Other Ambulatory Visit: Payer: Self-pay

## 2021-06-06 VITALS — BP 138/89 | HR 82 | Temp 97.9°F | Resp 16 | Ht 72.0 in | Wt 211.0 lb

## 2021-06-06 DIAGNOSIS — C61 Malignant neoplasm of prostate: Secondary | ICD-10-CM | POA: Diagnosis not present

## 2021-06-06 DIAGNOSIS — D472 Monoclonal gammopathy: Secondary | ICD-10-CM

## 2021-06-06 NOTE — Progress Notes (Signed)
Hematology and Oncology Follow Up Visit  Rodney Watkins [id number] 1955/11/29 66 y.o. 06/06/2021 10:02 AM McGowen, Adrian Blackwater, MDMcGowen, Adrian Blackwater, MD   Principle Diagnosis: 66 year old with IgG kappa MGUS diagnosed in 2022.  He was found to have an M spike of 0.6 g/dL with normal IgG level, quantitative immunoglobulins and serum light chains.  Current therapy: Active surveillance.  Interim History: [name] returns today for a follow-up visit.  Since her last visit, he continues to have issues with neuropathy and has been evaluated by neurology without any clear etiology.  He denies any constitutional symptoms of weight loss or bone pain.  His performance status quality of life remains unchanged.     Medications: I have reviewed the patient's current medications.  Current Outpatient Medications  Medication Sig Dispense Refill   cetirizine (ZYRTEC) 10 MG tablet Take 10 mg by mouth daily as needed for allergies.     ezetimibe (ZETIA) 10 MG tablet Take 1 tablet (10 mg total) by mouth daily. 90 tablet 3   gabapentin (NEURONTIN) 100 MG capsule 1-3 caps po tid as needed for lower extremity neuropathic pain 180 capsule 3   Multiple Vitamin (MULTIVITAMIN) tablet Take 1 tablet by mouth daily.     sildenafil (VIAGRA) 100 MG tablet Take by mouth at bedtime. (Patient not taking: Reported on 05/14/2021)     No current facility-administered medications for this visit.     Allergies:  Allergies  Allergen Reactions   Atorvastatin Other (See Comments)    Myalgias/arthralgias      Physical Exam: Blood pressure 138/89, pulse 82, temperature 97.9 F (36.6 C), temperature source Temporal, resp. rate 16, height 6' (1.829 m), weight 211 lb (95.7 kg), SpO2 99 %.  ECOG: 0   General appearance: Comfortable appearing without any discomfort Head: Normocephalic without any trauma Oropharynx: Mucous membranes are moist and pink without any thrush or ulcers. Eyes: Pupils are equal and round  reactive to light. Lymph nodes: No cervical, supraclavicular, inguinal or axillary lymphadenopathy.   Heart:regular rate and rhythm.  S1 and S2 without leg edema. Lung: Clear without any rhonchi or wheezes.  No dullness to percussion. Abdomin: Soft, nontender, nondistended with good bowel sounds.  No hepatosplenomegaly. Musculoskeletal: No joint deformity or effusion.  Full range of motion noted. Neurological: No deficits noted on motor, sensory and deep tendon reflex exam. Skin: No petechial rash or dryness.  Appeared moist.     Lab Results: Lab Results  Component Value Date   WBC 4.1 05/30/2021   HGB 13.8 05/30/2021   HCT 41.4 05/30/2021   MCV 91.6 05/30/2021   PLT 236 05/30/2021     Chemistry      Component Value Date/Time   NA 138 05/30/2021 1018   NA 140 01/06/2017 1535   K 4.2 05/30/2021 1018   CL 109 05/30/2021 1018   CO2 23 05/30/2021 1018   BUN 20 05/30/2021 1018   BUN 13 01/06/2017 1535   CREATININE 0.86 05/30/2021 1018   CREATININE 0.89 06/20/2020 0927   GLU 120 01/06/2017 1535      Component Value Date/Time   CALCIUM 9.5 05/30/2021 1018   ALKPHOS 48 05/30/2021 1018   AST 16 05/30/2021 1018   ALT 15 05/30/2021 1018   BILITOT 0.5 05/30/2021 1018         Impression and Plan:   66 year old with:  1.  Monoclonal gammopathy detected in 2022.  He was found to have IgG kappa MGUS without any evidence of endorgan damage.  Laboratory data obtained on January 2023 were personally reviewed and discussed with the patient.  He has no evidence of endorgan damage at this time with normal kidney function, electrolytes and a normal CBC.  His M spike is 0.6 g/dL with normal IgG level and quantitative immunoglobulins.  The natural course of this disease was reviewed at this time and risk of progression into multiple myeloma were reiterated.  At this time, the risk of progression remains at 1 %/year and I recommended annual surveillance at this time.  He is agreeable to  continue.   2.  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  He continues to follow with neurology regarding this issue.   3.  Prostate cancer presented in 2018 with a Gleason score of 7.  He is currently on active surveillance after prostatectomy and salvage radiation.  His last PSA remains undetectable at this time.   4.  Follow-up: In 12 months for repeat follow-up.   30 minutes were spent on this encounter.  The time was dedicated to reviewing laboratory data, disease status update and outlining future plan of care discussion.   [name], MD 1/27/202310:02 AM

## 2021-07-04 ENCOUNTER — Other Ambulatory Visit: Payer: Self-pay | Admitting: Family Medicine

## 2021-09-09 ENCOUNTER — Encounter: Payer: Self-pay | Admitting: Dermatology

## 2021-09-09 ENCOUNTER — Ambulatory Visit (INDEPENDENT_AMBULATORY_CARE_PROVIDER_SITE_OTHER): Payer: 59 | Admitting: Dermatology

## 2021-09-09 DIAGNOSIS — Z1283 Encounter for screening for malignant neoplasm of skin: Secondary | ICD-10-CM

## 2021-09-09 DIAGNOSIS — Z85828 Personal history of other malignant neoplasm of skin: Secondary | ICD-10-CM | POA: Diagnosis not present

## 2021-09-09 DIAGNOSIS — Z86018 Personal history of other benign neoplasm: Secondary | ICD-10-CM | POA: Diagnosis not present

## 2021-09-09 DIAGNOSIS — D23122 Other benign neoplasm of skin of left lower eyelid, including canthus: Secondary | ICD-10-CM | POA: Diagnosis not present

## 2021-09-27 ENCOUNTER — Encounter: Payer: Self-pay | Admitting: Dermatology

## 2021-09-27 NOTE — Progress Notes (Signed)
   Follow-up visit   Allenwood III is a 66 y.o. male who presents for the following: Annual Exam (Patient here today for skin check. Per patient he has a skin tag on his left eye that he would like removed if possible. Personal history of atypical moles, and non mole skin cancer. Per patient no family history of atypical moles, melanoma or non mole skin cancer. ).  Annual examination, discuss removal of growth under eye Location:  Duration:  Quality:  Associated Signs/Symptoms: Modifying Factors:  Severity:  Timing: Context:    The following portions of the chart were reviewed this encounter and updated as appropriate:  Tobacco  Allergies  Meds  Problems  Med Hx  Surg Hx  Fam Hx      Objective  Well appearing patient in no apparent distress; mood and affect are within normal limits. General skin examination: No atypical nevi or signs of NMSC noted at the time of the visit.   Left Lower Eyelid Margin 2 mm verrucous flesh-colored pedunculated papules      A full examination was performed including scalp, head, eyes, ears, nose, lips, neck, chest, axillae, abdomen, back, buttocks, bilateral upper extremities, bilateral lower extremities, hands, feet, fingers, toes, fingernails, and toenails. All findings within normal limits unless otherwise noted below.   Assessment & Plan  Skin exam for malignant neoplasm  Yearly skin check.  Encouraged to self examine with spouse twice annually.  Continued ultraviolet protection.  Papilloma of left lower eyelid Left Lower Eyelid Margin  Will schedule a 15 minute removal.

## 2021-10-23 ENCOUNTER — Ambulatory Visit: Payer: 59 | Admitting: Dermatology

## 2021-11-27 ENCOUNTER — Ambulatory Visit (INDEPENDENT_AMBULATORY_CARE_PROVIDER_SITE_OTHER): Payer: 59 | Admitting: Dermatology

## 2021-11-27 DIAGNOSIS — D485 Neoplasm of uncertain behavior of skin: Secondary | ICD-10-CM

## 2021-11-27 DIAGNOSIS — D489 Neoplasm of uncertain behavior, unspecified: Secondary | ICD-10-CM

## 2021-11-27 DIAGNOSIS — L570 Actinic keratosis: Secondary | ICD-10-CM

## 2021-11-27 DIAGNOSIS — L918 Other hypertrophic disorders of the skin: Secondary | ICD-10-CM | POA: Diagnosis not present

## 2021-11-27 MED ORDER — MUPIROCIN 2 % EX OINT: 1.0000 | TOPICAL_OINTMENT | Freq: Two times a day (BID) | CUTANEOUS | 0 refills | Status: DC

## 2021-11-27 NOTE — Patient Instructions (Signed)

## 2021-12-16 IMAGING — DX DG FOOT COMPLETE 3+V*R*
3 series · 3 of 3 positions shown · non-contrast
Comparison: None.

CLINICAL DATA: Foot pain plantar pain

EXAM:
RIGHT FOOT COMPLETE - 3+ VIEW

[foot ap]
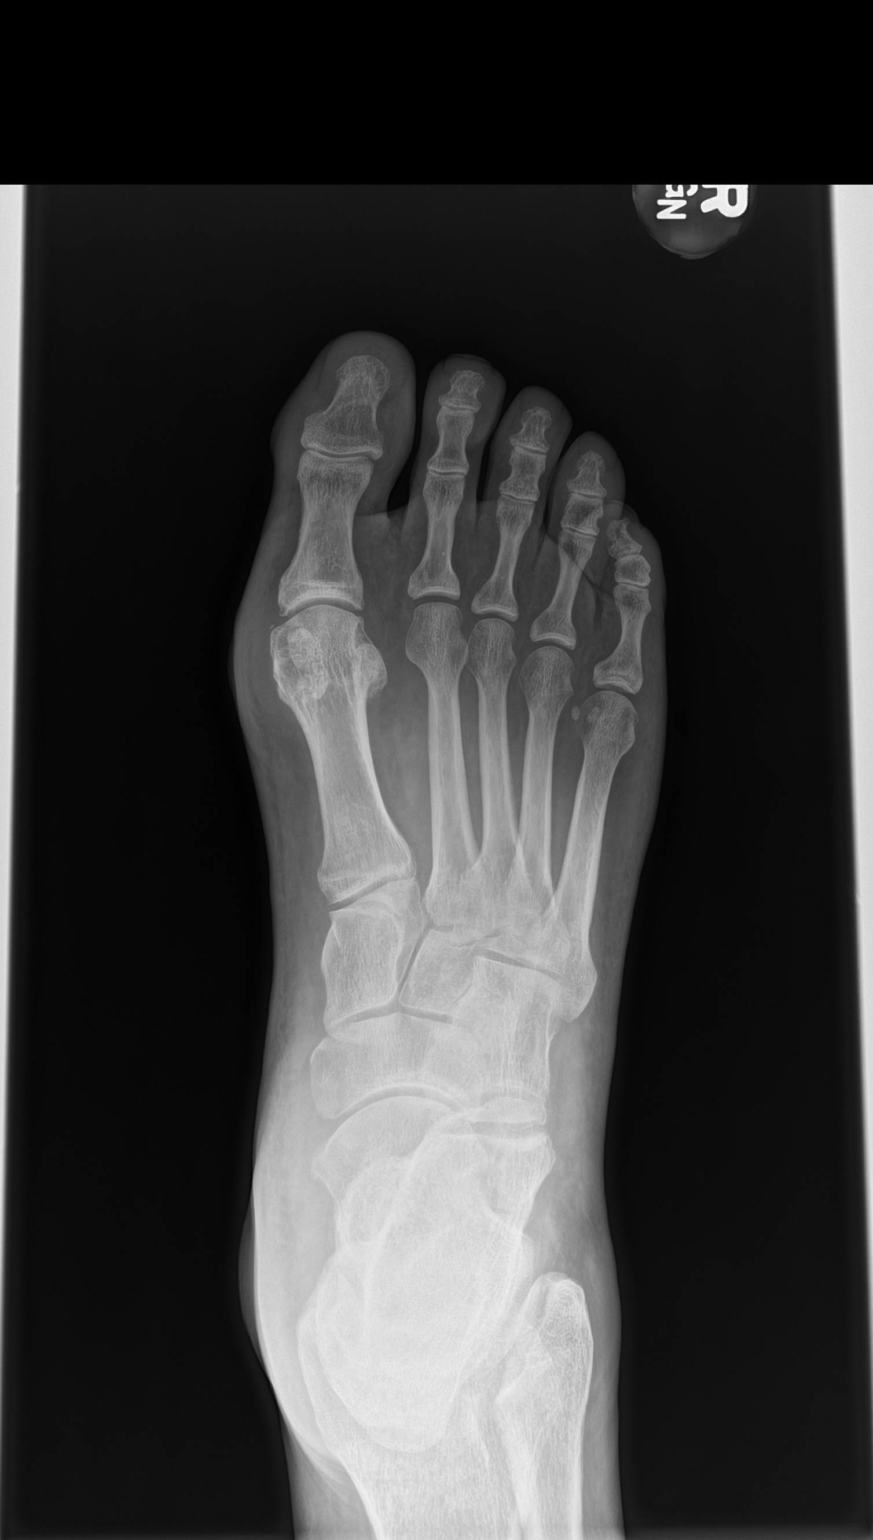

[foot obl]
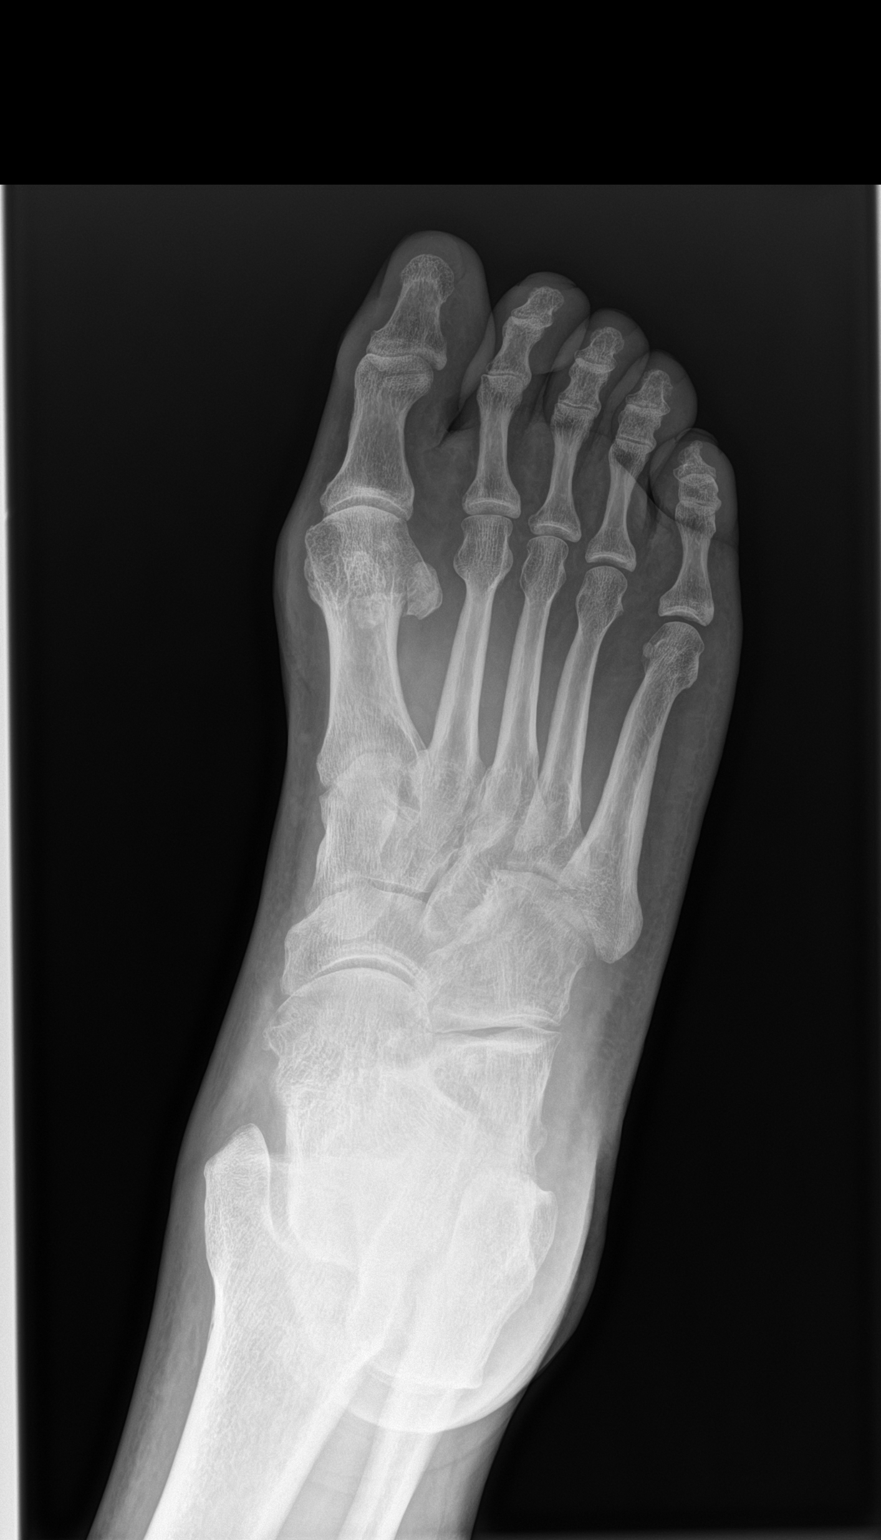

[foot lat]
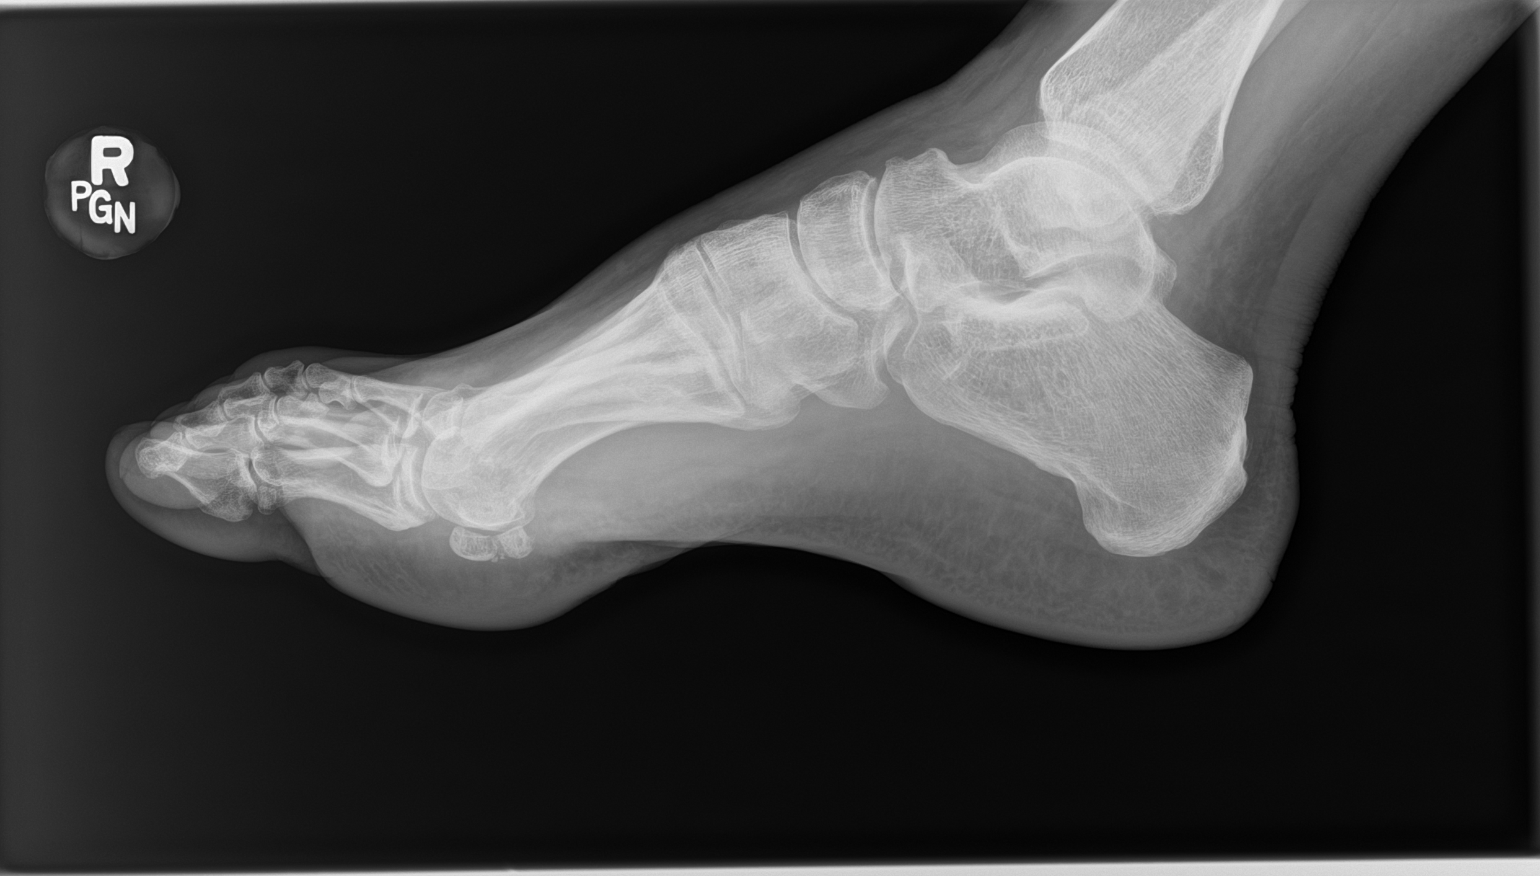

[3 of 3 positions shown; findings below may reference images not displayed]

FINDINGS: No fracture or malalignment. Mild degenerative change at the first
MTP joint. Soft tissues are unremarkable.
IMPRESSION: No acute osseous abnormality. Mild degenerative change at the first
MTP joint.

## 2021-12-20 ENCOUNTER — Encounter: Payer: Self-pay | Admitting: Dermatology

## 2021-12-20 NOTE — Progress Notes (Signed)
   Follow-Up Visit   Subjective  Rodney Watkins is a 66 y.o. male who presents for the following: Procedure (Here for skin removal on left eye. Also check left lower leg cut it on some metal and not healed yet. X 2 months ago.).  Growth left inner eyelid plus removal of skin tags Location:  Duration:  Quality:  Associated Signs/Symptoms: Modifying Factors:  Severity:  Timing: Context:   Objective  Well appearing patient in no apparent distress; mood and affect are within normal limits. Left Conjunctiva Verrucous pearly 3 mm papule just below left inner eyelid margin  Left Malar Cheek Flesh-colored pedunculated papules    A focused examination was performed including head and neck. Relevant physical exam findings are noted in the Assessment and Plan.   Assessment & Plan    AK (actinic keratosis) Right Lower Leg - Posterior  If lesion does not go away patient can return for biopsy.  Destruction of lesion - Right Lower Leg - Posterior Complexity: simple   Destruction method: cryotherapy   Informed consent: discussed and consent obtained   Timeout:  patient name, date of birth, surgical site, and procedure verified Lesion destroyed using liquid nitrogen: Yes   Outcome: patient tolerated procedure well with no complications    Related Medications mupirocin ointment (BACTROBAN) 2 % Apply 1 Application topically 2 (two) times daily.  Neoplasm of uncertain behavior Left Conjunctiva  Skin / nail biopsy Type of biopsy: tangential   Informed consent: discussed and consent obtained   Timeout: patient name, date of birth, surgical site, and procedure verified   Procedure prep:  Patient was prepped and draped in usual sterile fashion Prep type:  Chlorhexidine Anesthesia: the lesion was anesthetized in a standard fashion   Anesthetic:  1% lidocaine w/ epinephrine 1-100,000 local infiltration Instrument used: flexible razor blade   Hemostasis achieved with: ferric  subsulfate   Outcome: patient tolerated procedure well   Post-procedure details: wound care instructions given    Specimen 1 - Surgical pathology Differential Diagnosis: skin tag/papiloma  Check Margins: No  Skin tag Left Malar Cheek  Scissor excision at patient's request, risk of recurrence, black eye, scar understands this may not be covered by his health plan.  Epidermal / dermal shaving - Left Malar Cheek  Destruction of lesion - Left Malar Cheek      I, Lavonna Monarch, MD, have reviewed all documentation for this visit.  The documentation on 12/20/21 for the exam, diagnosis, procedures, and orders are all accurate and complete.

## 2022-03-25 ENCOUNTER — Encounter: Payer: Self-pay | Admitting: Family Medicine

## 2022-05-27 ENCOUNTER — Other Ambulatory Visit: Payer: Self-pay | Admitting: Nurse Practitioner

## 2022-05-27 DIAGNOSIS — D472 Monoclonal gammopathy: Secondary | ICD-10-CM

## 2022-06-02 NOTE — Progress Notes (Unsigned)
  [facility] OFFICE PROGRESS NOTE   Diagnosis: IgG kappa monoclonal gammopathy  INTERVAL HISTORY:   [name] is a 67 year old man found to have an IgG kappa monoclonal gammopathy as part of his evaluation for peripheral neuropathy.  SPEP on 11/14/2020 showed an M spike 0.6 g/dL in the gammaglobulin region.  Immunofixation showed IgG kappa monoclonal protein; IgA decreased at 65, IgG normal at 1012, IgM decreased at 31.  Repeat labs from 05/30/2021 showed stable M spike at 0.6, normal IgG and IgM, slightly decreased IgA; serum light chains within normal limits; CBC and chemistry panel unremarkable.  Objective:  Vital signs in last 24 hours:  There were no vitals taken for this visit.    HEENT: *** Lymphatics: *** Resp: *** Cardio: *** GI: *** Vascular: *** Neuro: ***  Skin: ***    Lab Results:  Lab Results  Component Value Date   WBC 4.1 05/30/2021   HGB 13.8 05/30/2021   HCT 41.4 05/30/2021   MCV 91.6 05/30/2021   PLT 236 05/30/2021   NEUTROABS 2.9 05/30/2021    Imaging:  No results found.  Medications: I have reviewed the patient's current medications.  Assessment/Plan: IgG kappa monoclonal gammopathy of unknown significance diagnosed 11/27/2020 Prostate cancer diagnosed 2018, Gleason score 7, status post prostatectomy and radiation  Disposition:    Ned Card ANP/GNP-BC   06/02/2022  3:53 PM

## 2022-06-03 ENCOUNTER — Encounter: Payer: Self-pay | Admitting: Nurse Practitioner

## 2022-06-03 ENCOUNTER — Other Ambulatory Visit: Payer: 59

## 2022-06-03 ENCOUNTER — Inpatient Hospital Stay: Payer: 59 | Attending: Oncology

## 2022-06-03 ENCOUNTER — Inpatient Hospital Stay (HOSPITAL_BASED_OUTPATIENT_CLINIC_OR_DEPARTMENT_OTHER): Payer: 59 | Admitting: Nurse Practitioner

## 2022-06-03 VITALS — BP 144/89 | HR 69 | Temp 98.2°F | Resp 18 | Ht 72.0 in | Wt 204.0 lb

## 2022-06-03 DIAGNOSIS — D472 Monoclonal gammopathy: Secondary | ICD-10-CM | POA: Insufficient documentation

## 2022-06-03 DIAGNOSIS — Z8546 Personal history of malignant neoplasm of prostate: Secondary | ICD-10-CM | POA: Insufficient documentation

## 2022-06-03 DIAGNOSIS — G629 Polyneuropathy, unspecified: Secondary | ICD-10-CM | POA: Insufficient documentation

## 2022-06-03 DIAGNOSIS — Z9079 Acquired absence of other genital organ(s): Secondary | ICD-10-CM | POA: Diagnosis not present

## 2022-06-03 LAB — CMP (CANCER CENTER ONLY)
ALT: 18 U/L (ref 0–44)
AST: 17 U/L (ref 15–41)
Albumin: 4.5 g/dL (ref 3.5–5.0)
Alkaline Phosphatase: 40 U/L (ref 38–126)
Anion gap: 6 (ref 5–15)
BUN: 17 mg/dL (ref 8–23)
CO2: 26 mmol/L (ref 22–32)
Calcium: 10.2 mg/dL (ref 8.9–10.3)
Chloride: 105 mmol/L (ref 98–111)
Creatinine: 0.98 mg/dL (ref 0.61–1.24)
GFR, Estimated: >60 mL/min (ref >60)
Glucose, Bld: 111 mg/dL — ABNORMAL HIGH (ref 70–99)
Potassium: 4.4 mmol/L (ref 3.5–5.1)
Sodium: 137 mmol/L (ref 135–145)
Total Bilirubin: 0.5 mg/dL (ref 0.3–1.2)
Total Protein: 7.3 g/dL (ref 6.5–8.1)

## 2022-06-03 LAB — CBC WITH DIFFERENTIAL (CANCER CENTER ONLY)
Abs Immature Granulocytes: 0.01 10*3/uL (ref 0.00–0.07)
Basophils Absolute: 0 10*3/uL (ref 0.0–0.1)
Basophils Relative: 1 %
Eosinophils Absolute: 0.1 10*3/uL (ref 0.0–0.5)
Eosinophils Relative: 3 %
HCT: 43.8 % (ref 39.0–52.0)
Hemoglobin: 14.7 g/dL (ref 13.0–17.0)
Immature Granulocytes: 0 %
Lymphocytes Relative: 18 %
Lymphs Abs: 0.8 10*3/uL (ref 0.7–4.0)
MCH: 30.9 pg (ref 26.0–34.0)
MCHC: 33.6 g/dL (ref 30.0–36.0)
MCV: 92.2 fL (ref 80.0–100.0)
Monocytes Absolute: 0.6 10*3/uL (ref 0.1–1.0)
Monocytes Relative: 13 %
Neutro Abs: 3.1 10*3/uL (ref 1.7–7.7)
Neutrophils Relative %: 65 %
Platelet Count: 274 10*3/uL (ref 150–400)
RBC: 4.75 MIL/uL (ref 4.22–5.81)
RDW: 13.6 % (ref 11.5–15.5)
WBC Count: 4.8 10*3/uL (ref 4.0–10.5)
nRBC: 0 % (ref 0.0–0.2)

## 2022-06-04 LAB — KAPPA/LAMBDA LIGHT CHAINS
Kappa free light chain: 17.4 mg/L (ref 3.3–19.4)
Kappa, lambda light chain ratio: 1.58 (ref 0.26–1.65)
Lambda free light chains: 11 mg/L (ref 5.7–26.3)

## 2022-06-05 LAB — PROTEIN ELECTROPHORESIS, SERUM
A/G Ratio: 1.6 (ref 0.7–1.7)
Albumin ELP: 4.1 g/dL (ref 2.9–4.4)
Alpha-1-Globulin: 0.2 g/dL (ref 0.0–0.4)
Alpha-2-Globulin: 0.5 g/dL (ref 0.4–1.0)
Beta Globulin: 0.8 g/dL (ref 0.7–1.3)
Gamma Globulin: 1.1 g/dL (ref 0.4–1.8)
Globulin, Total: 2.6 g/dL (ref 2.2–3.9)
M-Spike, %: 0.2 g/dL — ABNORMAL HIGH
Total Protein ELP: 6.7 g/dL (ref 6.0–8.5)

## 2022-06-10 ENCOUNTER — Other Ambulatory Visit: Payer: Self-pay | Admitting: Nurse Practitioner

## 2022-06-10 ENCOUNTER — Telehealth: Payer: Self-pay

## 2022-06-10 ENCOUNTER — Ambulatory Visit: Payer: 59 | Admitting: Oncology

## 2022-06-10 DIAGNOSIS — D472 Monoclonal gammopathy: Secondary | ICD-10-CM

## 2022-06-10 NOTE — Telephone Encounter (Signed)
Patient gave verbal understanding and had no further questions or concerns  

## 2022-06-10 NOTE — Telephone Encounter (Signed)
-----  Message from Owens Shark, NP sent at 06/10/2022  8:18 AM EST ----- Please let him know labs are stable, f/u as scheduled.

## 2023-03-03 ENCOUNTER — Ambulatory Visit: Payer: 59 | Admitting: Oncology

## 2023-03-03 ENCOUNTER — Other Ambulatory Visit: Payer: 59

## 2023-03-23 ENCOUNTER — Inpatient Hospital Stay (HOSPITAL_BASED_OUTPATIENT_CLINIC_OR_DEPARTMENT_OTHER): Payer: 59 | Admitting: Oncology

## 2023-03-23 ENCOUNTER — Inpatient Hospital Stay: Payer: 59 | Attending: Oncology

## 2023-03-23 VITALS — BP 135/89 | HR 67 | Temp 98.1°F | Resp 18 | Ht 72.0 in | Wt 208.2 lb

## 2023-03-23 DIAGNOSIS — Z8546 Personal history of malignant neoplasm of prostate: Secondary | ICD-10-CM | POA: Insufficient documentation

## 2023-03-23 DIAGNOSIS — Z923 Personal history of irradiation: Secondary | ICD-10-CM | POA: Insufficient documentation

## 2023-03-23 DIAGNOSIS — G629 Polyneuropathy, unspecified: Secondary | ICD-10-CM | POA: Diagnosis not present

## 2023-03-23 DIAGNOSIS — Z9079 Acquired absence of other genital organ(s): Secondary | ICD-10-CM | POA: Insufficient documentation

## 2023-03-23 DIAGNOSIS — C61 Malignant neoplasm of prostate: Secondary | ICD-10-CM | POA: Diagnosis not present

## 2023-03-23 DIAGNOSIS — D472 Monoclonal gammopathy: Secondary | ICD-10-CM | POA: Diagnosis present

## 2023-03-23 LAB — CBC WITH DIFFERENTIAL (CANCER CENTER ONLY)
Abs Immature Granulocytes: 0.02 10*3/uL (ref 0.00–0.07)
Basophils Absolute: 0 10*3/uL (ref 0.0–0.1)
Basophils Relative: 1 %
Eosinophils Absolute: 0.1 10*3/uL (ref 0.0–0.5)
Eosinophils Relative: 3 %
HCT: 41.8 % (ref 39.0–52.0)
Hemoglobin: 14.4 g/dL (ref 13.0–17.0)
Immature Granulocytes: 1 %
Lymphocytes Relative: 18 %
Lymphs Abs: 0.8 10*3/uL (ref 0.7–4.0)
MCH: 31.6 pg (ref 26.0–34.0)
MCHC: 34.4 g/dL (ref 30.0–36.0)
MCV: 91.7 fL (ref 80.0–100.0)
Monocytes Absolute: 0.5 10*3/uL (ref 0.1–1.0)
Monocytes Relative: 11 %
Neutro Abs: 2.9 10*3/uL (ref 1.7–7.7)
Neutrophils Relative %: 66 %
Platelet Count: 256 10*3/uL (ref 150–400)
RBC: 4.56 MIL/uL (ref 4.22–5.81)
RDW: 13.7 % (ref 11.5–15.5)
WBC Count: 4.3 10*3/uL (ref 4.0–10.5)
nRBC: 0 % (ref 0.0–0.2)

## 2023-03-23 LAB — CMP (CANCER CENTER ONLY)
ALT: 18 U/L (ref 0–44)
AST: 22 U/L (ref 15–41)
Albumin: 4.1 g/dL (ref 3.5–5.0)
Alkaline Phosphatase: 39 U/L (ref 38–126)
Anion gap: 5 (ref 5–15)
BUN: 15 mg/dL (ref 8–23)
CO2: 26 mmol/L (ref 22–32)
Calcium: 9.6 mg/dL (ref 8.9–10.3)
Chloride: 108 mmol/L (ref 98–111)
Creatinine: 0.94 mg/dL (ref 0.61–1.24)
GFR, Estimated: >60 mL/min (ref >60)
Glucose, Bld: 110 mg/dL — ABNORMAL HIGH (ref 70–99)
Potassium: 4.2 mmol/L (ref 3.5–5.1)
Sodium: 139 mmol/L (ref 135–145)
Total Bilirubin: 0.6 mg/dL (ref <1.2)
Total Protein: 6.7 g/dL (ref 6.5–8.1)

## 2023-03-23 NOTE — Progress Notes (Signed)
  [facility] OFFICE PROGRESS NOTE   Diagnosis: Serum monoclonal protein, history of prostate cancer  INTERVAL HISTORY:   Rodney Watkins returns as scheduled.  He has chronic back pain.  His chief complaint is numbness, burning, and tingling in the lower extremities.  The symptoms have been present for several years.  He underwent a nerve biopsy at [facility] and was diagnosed with a "small fiber neuropathy ".  No recent infection.  Objective:  Vital signs in last 24 hours:  Blood pressure 135/89, pulse 67, temperature 98.1 F (36.7 C), temperature source Temporal, resp. rate 18, height 6' (1.829 m), weight 208 lb 3.2 oz (94.4 kg), SpO2 99%.    HEENT: The tongue appears normal Lymphatics: No cervical, supraclavicular, axillary, or inguinal nodes Resp: Lungs clear bilaterally Cardio: Regular rate and rhythm GI: No hepatosplenomegaly Vascular: No leg edema Neuro: The motor exam is intact with dorsi flexion at the feet bilaterally    Lab Results:  Lab Results  Component Value Date   WBC 4.3 03/23/2023   HGB 14.4 03/23/2023   HCT 41.8 03/23/2023   MCV 91.7 03/23/2023   PLT 256 03/23/2023   NEUTROABS 2.9 03/23/2023    CMP  Lab Results  Component Value Date   NA 139 03/23/2023   K 4.2 03/23/2023   CL 108 03/23/2023   CO2 26 03/23/2023   GLUCOSE 110 (H) 03/23/2023   BUN 15 03/23/2023   CREATININE 0.94 03/23/2023   CALCIUM 9.6 03/23/2023   PROT 6.7 03/23/2023   ALBUMIN 4.1 03/23/2023   AST 22 03/23/2023   ALT 18 03/23/2023   ALKPHOS 39 03/23/2023   BILITOT 0.6 03/23/2023   GFRNONAA >60 03/23/2023   GFRAA >60 05/04/2019     Medications: I have reviewed the patient's current medications.   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  There is no clinical evidence for progression of the monoclonal protein to multiple myeloma or another lymphoproliferative disorder.  We will follow-up on the serum protein electrophoresis from today.  He will return for an office and lab visit in 9 months.  I think is very unlikely the neuropathy symptoms are related to the serum monoclonal protein.  I recommended he follow-up with his neurologist here and at [facility].  He will bring a copy of the nerve biopsy report.  We will be glad to see him if his neurologists feel the neuropathy could be related to the IgG kappa protein.  Thornton Papas, MD  03/23/2023  10:49 AM

## 2023-03-24 LAB — IGG, IGA, IGM
IgA: 59 mg/dL — ABNORMAL LOW (ref 61–437)
IgG (Immunoglobin G), Serum: 992 mg/dL (ref 603–1613)
IgM (Immunoglobulin M), Srm: 37 mg/dL (ref 20–172)

## 2023-03-24 LAB — KAPPA/LAMBDA LIGHT CHAINS
Kappa free light chain: 14.6 mg/L (ref 3.3–19.4)
Kappa, lambda light chain ratio: 0.88 (ref 0.26–1.65)
Lambda free light chains: 16.5 mg/L (ref 5.7–26.3)

## 2023-03-26 LAB — PROTEIN ELECTROPHORESIS, SERUM
A/G Ratio: 1.6 (ref 0.7–1.7)
Albumin ELP: 3.9 g/dL (ref 2.9–4.4)
Alpha-1-Globulin: 0.2 g/dL (ref 0.0–0.4)
Alpha-2-Globulin: 0.5 g/dL (ref 0.4–1.0)
Beta Globulin: 0.8 g/dL (ref 0.7–1.3)
Gamma Globulin: 0.9 g/dL (ref 0.4–1.8)
Globulin, Total: 2.4 g/dL (ref 2.2–3.9)
M-Spike, %: 0.5 g/dL — ABNORMAL HIGH
Total Protein ELP: 6.3 g/dL (ref 6.0–8.5)

## 2023-03-29 ENCOUNTER — Encounter: Payer: Self-pay | Admitting: *Deleted

## 2023-03-30 ENCOUNTER — Telehealth: Payer: Self-pay | Admitting: Family Medicine

## 2023-03-30 NOTE — Telephone Encounter (Signed)
Patient dropped off document Surgical Clearance, to be filled out by provider. Patient requested to send it back via Fax within ASAP. Document is located in providers tray at front office.Please advise at Mobile [phone] (mobile)  Patient states he would like to pick up the form. He is scheduled for Friday 11/22. I informed him he would need an appointment since it has been a couple years since he has been seen by Dr. Milinda Cave. I also inquired about Dr. Wallace Cullens being listed as his PCP and he says that's not his PCP and not sure who changed it. Form is placed in Dr. Verdis Frederickson tray up front.

## 2023-03-30 NOTE — Telephone Encounter (Signed)
Will discuss at appt on 11/22

## 2023-04-01 ENCOUNTER — Encounter: Payer: Self-pay | Admitting: Family Medicine

## 2023-04-01 NOTE — Progress Notes (Signed)
Office Note 04/02/2023  CC:  Chief Complaint  Patient presents with   Annual Exam    Pt is fasting.     HPI:  Patient is a 67 y.o. male who is here for annual health maintenance exam and preoperative clearance.  He feels well other some back pain that is emerged over the last year or so. Dr. Shon Baton has proposed the option of L5-S1 fixation.  Other than chronic bilateral lower extremity neuropathic pain he feels well.  He has no shortness of breath, no chest pain, no dizziness, no lower extremity swelling, no recent URI/cough or GI illness. He has never had any problems with anesthesia.  Past Medical History:  Diagnosis Date   Atypical chest pain 05/2019   Insurance denied stress test I ordered.  Pt chose watchful waiting approach at that time.   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   IFG (impaired fasting glucose)    Per old records.  A1c 5.7% 05/2017.  A1c 5.3% 10/2017. A1c 5.9% 06/2020.   Incisional hernia without obstruction or gangrene 05/2017   Kidney stone    Lumbar spondylosis    w/pars defect and listhesis (Dr. Wynetta Emery).  Referred by neurosurg to pain mgmt MD and pt got ESI x 2 ---no help.   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 Truett Perna)   Osteoarthritis 2021    1st MTP R foot   Paresthesias    UEs and LE's 2019---recommended neuro referral and pt wanted to think about it.  Then, Dr. Allena Katz evaluated him 05/2018 and did NCS/EMG-->normal.  The sensation transitioned to BURNING pain->NCS/EMG normal again 09/2020.   Peripheral neuropathy    small fiber neuropathy (nerve bx)   Prostate cancer (HCC)    Locally advanced prostate ca 06/2016.  Prostatectomy  RAL radical prostatectomy 07/2016.  Biochemical recurrence 2020/2021--salvage RT+ ADT. Undect PSA 02/2020, 09/2020, 12/2021   Pulmonary nodule 2009   Stable CT chest 2010--no further scans needed in pt at low risk for lung ca.   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   Squamous cell carcinoma of skin 08/05/2011   Left Calf (well diff) curet and 5FU)    Past Surgical History:  Procedure Laterality Date   COLONOSCOPY  04/20/11   Normal: recall 10 yrs   ett  approx 2010   normal   KNEE ARTHROSCOPY  2000   left   LYMPHADENECTOMY Bilateral 08/06/2016   Procedure: PELVIC LYMPHADENECTOMY;  Surgeon: Heloise Purpura, MD;  Location: [facility];  Service: Urology;  Laterality: Bilateral;   NCV & EMG  06/02/2018; 06/14/2018;  09/2020   07/03/2018 (LEGS)->NORMAL.  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  09/2020 LEGS NORMAL    REFRACTIVE SURGERY  2000   both eyes   ROBOT ASSISTED LAPAROSCOPIC RADICAL PROSTATECTOMY N/A 08/06/2016   Procedure: Path: adenocarcinoma with extraprostatic extension present, negative margins, lymph nodes NEG. XI ROBOTIC ASSISTED LAPAROSCOPIC RADICAL PROSTATECTOMY LEVEL 2;  Surgeon: Heloise Purpura, MD;  Location: [facility];  Service: Urology;  Laterality: N/A;   ROTATOR CUFF REPAIR  2002   right   ROTATOR CUFF REPAIR  2003   left    Family History  Problem Relation Age of Onset   Diabetes Father    Ovarian cancer Sister    Colon cancer Neg Hx    Stomach cancer Neg Hx     Prostate cancer Neg Hx    Breast cancer Neg Hx     Social History   Socioeconomic History   Marital status: Married    Spouse name: Meriam Sprague   Number of children: 3   Years of education: Not on file   Highest education level: Bachelor's degree (e.g., BA, AB, BS)  Occupational History   Occupation: FIRE DEPARTMENT    Employer: [employer]    Comment: Retired  Tobacco Use   Smoking status: Never   Smokeless tobacco: Never  Vaping Use   Vaping status: Never Used  Substance and Sexual Activity   Alcohol use: Yes    Alcohol/week: 3.0 standard drinks of alcohol    Types: 3 Cans of beer per week   Drug use: No   Sexual activity: Yes  Other Topics Concern   Not on file  Social History Narrative   Married, 3 daughters.   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   No T/A/Ds.         Pt is right-handed. He lives with his wife and three daughters in a two level home. Prior to prostate cancer surgery in 2018, he exercised regularly. He walks now, weather permitting.   Social Determinants of Health   Financial Resource Strain: Not on file  Food Insecurity: Not on file  Transportation Needs: Not on file  Physical Activity: Not on file  Stress: Not on file  Social Connections: Not on file  Intimate Partner Violence: Not on file    Outpatient Medications Prior to Visit  Medication Sig Dispense Refill   Alpha-Lipoic Acid 600 MG CAPS Take 300 capsules by mouth daily.     b complex vitamins capsule Take 1 capsule by mouth daily.     cetirizine (ZYRTEC) 10 MG tablet Take 10 mg by mouth daily as needed for allergies.     Cholecalciferol 25 MCG (1000 UT) tablet Take by mouth.     Magnesium Chloride (MAGNESIUM DR PO) Take 1 tablet by mouth every 3 (three) days.     Multiple Vitamin (MULTIVITAMIN) tablet Take 1 tablet by mouth daily.     sildenafil (VIAGRA) 100 MG tablet Take by mouth at bedtime.     No  facility-administered medications prior to visit.    Allergies  Allergen Reactions   Atorvastatin Other (See Comments)    Myalgias/arthralgias    Review of Systems  Constitutional:  Negative for appetite change, chills, fatigue and fever.  HENT:  Negative for congestion, dental problem, ear pain and sore throat.   Eyes:  Negative for discharge, redness and visual disturbance.  Respiratory:  Negative for cough, chest tightness,  shortness of breath and wheezing.   Cardiovascular:  Negative for chest pain, palpitations and leg swelling.  Gastrointestinal:  Negative for abdominal pain, blood in stool, diarrhea, nausea and vomiting.  Genitourinary:  Negative for difficulty urinating, dysuria, flank pain, frequency, hematuria and urgency.  Musculoskeletal:  Positive for back pain. Negative for arthralgias, joint swelling, myalgias and neck stiffness.  Skin:  Negative for pallor and rash.  Neurological:  Negative for dizziness, speech difficulty, weakness and headaches.       Chronic bilat LE neuropathic pain  Hematological:  Negative for adenopathy. Does not bruise/bleed easily.  Psychiatric/Behavioral:  Negative for confusion and sleep disturbance. The patient is not nervous/anxious.     PE;    04/02/2023    8:24 AM 03/23/2023   10:08 AM 06/03/2022   11:18 AM  Vitals with BMI  Height 6' 0.5" 6\' 0"  6\' 0"   Weight 207 lbs 6 oz 208 lbs 3 oz 204 lbs  BMI 27.73 28.23 27.66  Systolic 128 135 161  Diastolic 78 89 89  Pulse 71 67 69  Gen: Alert, well appearing.  Patient is oriented to person, place, time, and situation. AFFECT: pleasant, lucid thought and speech. ENT: Ears: EACs clear, normal epithelium.  TMs with good light reflex and landmarks bilaterally.  Eyes: no injection, icteris, swelling, or exudate.  EOMI, PERRLA. Nose: no drainage or turbinate edema/swelling.  No injection or focal lesion.  Mouth: lips without lesion/swelling.  Oral mucosa pink and moist.  Dentition intact and  without obvious caries or gingival swelling.  Oropharynx without erythema, exudate, or swelling.  Neck: supple/nontender.  No LAD, mass, or TM.  Carotid pulses 2+ bilaterally, without bruits. CV: RRR, no m/r/g.   LUNGS: CTA bilat, nonlabored resps, good aeration in all lung fields. ABD: soft, NT, ND, BS normal.  No hepatospenomegaly or mass.  No bruits. EXT: no clubbing, cyanosis, or edema.  Musculoskeletal: no joint swelling, erythema, warmth, or tenderness.  ROM of all joints intact. Skin - no sores or suspicious lesions or rashes or color changes  Pertinent labs:  Lab Results  Component Value Date   TSH 1.83 11/14/2020   Lab Results  Component Value Date   WBC 4.3 03/23/2023   HGB 14.4 03/23/2023   HCT 41.8 03/23/2023   MCV 91.7 03/23/2023   PLT 256 03/23/2023   Lab Results  Component Value Date   CREATININE 0.94 03/23/2023   BUN 15 03/23/2023   NA 139 03/23/2023   K 4.2 03/23/2023   CL 108 03/23/2023   CO2 26 03/23/2023   Lab Results  Component Value Date   ALT 18 03/23/2023   AST 22 03/23/2023   ALKPHOS 39 03/23/2023   BILITOT 0.6 03/23/2023   Lab Results  Component Value Date   CHOL 167 12/19/2020   Lab Results  Component Value Date   HDL 58 12/19/2020   Lab Results  Component Value Date   LDLCALC 95 12/19/2020   Lab Results  Component Value Date   TRIG 46 12/19/2020   Lab Results  Component Value Date   CHOLHDL 2.9 12/19/2020   Lab Results  Component Value Date   PSA <0.015 09/19/2020   PSA 0.067 01/23/2019   PSA 0.033 06/15/2018   Lab Results  Component Value Date   HGBA1C 5.5 12/19/2020   ASSESSMENT AND PLAN:   #1 preoperative medical clearance: He is in good shape and cleared for surgery without any specific recommendations.  #2 health maintenance exam: Reviewed age and gender appropriate  health maintenance issues (prudent diet, regular exercise, health risks of tobacco and excessive alcohol, use of seatbelts, fire alarms in home,  use of sunscreen).  Also reviewed age and gender appropriate health screening as well as vaccine recommendations. Vaccines: prevnar 20-->declined.  Flu-->declined. Labs: FLP, a1c (c-Met and CBC normal on 03/23/2023). Prostate ca screening: hx prost ca, PSAs followed by urol. Colon ca screening: polyp 05/2021--->recall 05/2026.  An After Visit Summary was printed and given to the patient.  FOLLOW UP:  Return in about 1 year (around 04/01/2024) for annual CPE (fasting).  Signed:  Santiago Bumpers, MD           04/02/2023

## 2023-04-02 ENCOUNTER — Ambulatory Visit (INDEPENDENT_AMBULATORY_CARE_PROVIDER_SITE_OTHER): Payer: 59 | Admitting: Family Medicine

## 2023-04-02 ENCOUNTER — Encounter: Payer: Self-pay | Admitting: Family Medicine

## 2023-04-02 VITALS — BP 128/78 | HR 71 | Ht 72.5 in | Wt 207.4 lb

## 2023-04-02 DIAGNOSIS — R7301 Impaired fasting glucose: Secondary | ICD-10-CM | POA: Diagnosis not present

## 2023-04-02 DIAGNOSIS — Z01818 Encounter for other preprocedural examination: Secondary | ICD-10-CM

## 2023-04-02 DIAGNOSIS — Z0000 Encounter for general adult medical examination without abnormal findings: Secondary | ICD-10-CM | POA: Diagnosis not present

## 2023-04-02 NOTE — Patient Instructions (Signed)
Health Maintenance, Male Adopting a healthy lifestyle and getting preventive care are important in promoting health and wellness. Ask your health care provider about: The right schedule for you to have regular tests and exams. Things you can do on your own to prevent diseases and keep yourself healthy. What should I know about diet, weight, and exercise? Eat a healthy diet  Eat a diet that includes plenty of vegetables, fruits, low-fat dairy products, and lean protein. Do not eat a lot of foods that are high in solid fats, added sugars, or sodium. Maintain a healthy weight Body mass index (BMI) is a measurement that can be used to identify possible weight problems. It estimates body fat based on height and weight. Your health care provider can help determine your BMI and help you achieve or maintain a healthy weight. Get regular exercise Get regular exercise. This is one of the most important things you can do for your health. Most adults should: Exercise for at least 150 minutes each week. The exercise should increase your heart rate and make you sweat (moderate-intensity exercise). Do strengthening exercises at least twice a week. This is in addition to the moderate-intensity exercise. Spend less time sitting. Even light physical activity can be beneficial. Watch cholesterol and blood lipids Have your blood tested for lipids and cholesterol at 65 years of age, then have this test every 5 years. You may need to have your cholesterol levels checked more often if: Your lipid or cholesterol levels are high. You are older than 65 years of age. You are at high risk for heart disease. What should I know about cancer screening? Many types of cancers can be detected early and may often be prevented. Depending on your health history and family history, you may need to have cancer screening at various ages. This may include screening for: Colorectal cancer. Prostate cancer. Skin cancer. Lung  cancer. What should I know about heart disease, diabetes, and high blood pressure? Blood pressure and heart disease High blood pressure causes heart disease and increases the risk of stroke. This is more likely to develop in people who have high blood pressure readings or are overweight. Talk with your health care provider about your target blood pressure readings. Have your blood pressure checked: Every 3-5 years if you are 18-39 years of age. Every year if you are 40 years old or older. If you are between the ages of 65 and 75 and are a current or former smoker, ask your health care provider if you should have a one-time screening for abdominal aortic aneurysm (AAA). Diabetes Have regular diabetes screenings. This checks your fasting blood sugar level. Have the screening done: Once every three years after age 45 if you are at a normal weight and have a low risk for diabetes. More often and at a younger age if you are overweight or have a high risk for diabetes. What should I know about preventing infection? Hepatitis B If you have a higher risk for hepatitis B, you should be screened for this virus. Talk with your health care provider to find out if you are at risk for hepatitis B infection. Hepatitis C Blood testing is recommended for: Everyone born from 1945 through 1965. Anyone with known risk factors for hepatitis C. Sexually transmitted infections (STIs) You should be screened each year for STIs, including gonorrhea and chlamydia, if: You are sexually active and are younger than 65 years of age. You are older than 65 years of age and your   health care provider tells you that you are at risk for this type of infection. Your sexual activity has changed since you were last screened, and you are at increased risk for chlamydia or gonorrhea. Ask your health care provider if you are at risk. Ask your health care provider about whether you are at high risk for HIV. Your health care provider  may recommend a prescription medicine to help prevent HIV infection. If you choose to take medicine to prevent HIV, you should first get tested for HIV. You should then be tested every 3 months for as long as you are taking the medicine. Follow these instructions at home: Alcohol use Do not drink alcohol if your health care provider tells you not to drink. If you drink alcohol: Limit how much you have to 0-2 drinks a day. Know how much alcohol is in your drink. In the [place] one drink equals one 12 oz bottle of beer (355 mL), one 5 oz glass of wine (148 mL), or one 1 oz glass of hard liquor (44 mL). Lifestyle Do not use any products that contain nicotine or tobacco. These products include cigarettes, chewing tobacco, and vaping devices, such as e-cigarettes. If you need help quitting, ask your health care provider. Do not use street drugs. Do not share needles. Ask your health care provider for help if you need support or information about quitting drugs. General instructions Schedule regular health, dental, and eye exams. Stay current with your vaccines. Tell your health care provider if: You often feel depressed. You have ever been abused or do not feel safe at home. Summary Adopting a healthy lifestyle and getting preventive care are important in promoting health and wellness. Follow your health care provider's instructions about healthy diet, exercising, and getting tested or screened for diseases. Follow your health care provider's instructions on monitoring your cholesterol and blood pressure. This information is not intended to replace advice given to you by your health care provider. Make sure you discuss any questions you have with your health care provider. Document Revised: 09/16/2020 Document Reviewed: 09/16/2020 Elsevier Patient Education  2024 Else[employer] Inc.  

## 2023-04-03 LAB — HEMOGLOBIN A1C
Hgb A1c MFr Bld: 5.7 %{Hb} — ABNORMAL HIGH (ref <5.7)
Mean Plasma Glucose: 117 mg/dL
eAG (mmol/L): 6.5 mmol/L

## 2023-04-03 LAB — LIPID PANEL
Cholesterol: 206 mg/dL — ABNORMAL HIGH (ref <200)
HDL: 56 mg/dL (ref >=40)
LDL Cholesterol (Calc): 133 mg/dL — ABNORMAL HIGH
Non-HDL Cholesterol (Calc): 150 mg/dL — ABNORMAL HIGH (ref <130)
Total CHOL/HDL Ratio: 3.7 (calc) (ref <5.0)
Triglycerides: 80 mg/dL (ref <150)

## 2023-04-13 ENCOUNTER — Ambulatory Visit (HOSPITAL_COMMUNITY): Payer: Self-pay | Admitting: Orthopedic Surgery

## 2023-04-28 NOTE — Pre-Procedure Instructions (Signed)
Surgical Instructions   Your procedure is scheduled on Thursday, December 26th. Report to [facility] Main Entrance "A" at 08:30 A.M., then check in with the Admitting office. Any questions or running late day of surgery: call [phone]  Questions prior to your surgery date: call [phone], Monday-Friday, 8am-4pm. If you experience any cold or flu symptoms such as cough, fever, chills, shortness of breath, etc. between now and your scheduled surgery, please notify us at the above number.     Remember:  Do not eat after midnight the night before your surgery  You may drink clear liquids until 07:30 AM the morning of your surgery.   Clear liquids allowed are: Water, Non-Citrus Juices (without pulp), Carbonated Beverages, Clear Tea (no milk, honey, etc.), Black Coffee Only (NO MILK, CREAM OR POWDERED CREAMER of any kind), and Gatorade.    Take these medicines the morning of surgery with A SIP OF WATER  May take these medicines IF NEEDED: cetirizine (ZYRTEC)    One week prior to surgery, STOP taking any Aspirin (unless otherwise instructed by your surgeon) Aleve, Naproxen, Ibuprofen, Motrin, Advil, Goody's, BC's, all herbal medications, fish oil, and non-prescription vitamins.                     Do NOT Smoke (Tobacco/Vaping) for 24 hours prior to your procedure.  If you use a CPAP at night, you may bring your mask/headgear for your overnight stay.   You will be asked to remove any contacts, glasses, piercing's, hearing aid's, dentures/partials prior to surgery. Please bring cases for these items if needed.    Patients discharged the day of surgery will not be allowed to drive home, and someone needs to stay with them for 24 hours.  SURGICAL WAITING ROOM VISITATION Patients may have no more than 2 support people in the waiting area - these visitors may rotate.   Pre-op nurse will coordinate an appropriate time for 1 ADULT support person, who may not rotate, to accompany patient  in pre-op.  Children under the age of 21 must have an adult with them who is not the patient and must remain in the main waiting area with an adult.  If the patient needs to stay at the hospital during part of their recovery, the visitor guidelines for inpatient rooms apply.  Please refer to the [facility] website for the visitor guidelines for any additional information.   If you received a COVID test during your pre-op visit  it is requested that you wear a mask when out in public, stay away from anyone that may not be feeling well and notify your surgeon if you develop symptoms. If you have been in contact with anyone that has tested positive in the last 10 days please notify you surgeon.      Pre-operative 5 CHG Bathing Instructions   You can play a key role in reducing the risk of infection after surgery. Your skin needs to be as free of germs as possible. You can reduce the number of germs on your skin by washing with CHG (chlorhexidine gluconate) soap before surgery. CHG is an antiseptic soap that kills germs and continues to kill germs even after washing.   DO NOT use if you have an allergy to chlorhexidine/CHG or antibacterial soaps. If your skin becomes reddened or irritated, stop using the CHG and notify one of our RNs at [phone].   Please shower with the CHG soap starting 4 days before surgery using the following  schedule:     Please keep in mind the following:  DO NOT shave, including legs and underarms, starting the day of your first shower.   You may shave your face at any point before/day of surgery.  Place clean sheets on your bed the day you start using CHG soap. Use a clean washcloth (not used since being washed) for each shower. DO NOT sleep with pets once you start using the CHG.   CHG Shower Instructions:  Wash your face and private area with normal soap. If you choose to wash your hair, wash first with your normal shampoo.  After you use shampoo/soap,  rinse your hair and body thoroughly to remove shampoo/soap residue.  Turn the water OFF and apply about 3 tablespoons (45 ml) of CHG soap to a CLEAN washcloth.  Apply CHG soap ONLY FROM YOUR NECK DOWN TO YOUR TOES (washing for 3-5 minutes)  DO NOT use CHG soap on face, private areas, open wounds, or sores.  Pay special attention to the area where your surgery is being performed.  If you are having back surgery, having someone wash your back for you may be helpful. Wait 2 minutes after CHG soap is applied, then you may rinse off the CHG soap.  Pat dry with a clean towel  Put on clean clothes/pajamas   If you choose to wear lotion, please use ONLY the CHG-compatible lotions on the back of this paper.   Additional instructions for the day of surgery: DO NOT APPLY any lotions, deodorants, cologne, or perfumes.   Do not bring valuables to the hospital. [facility] is not responsible for any belongings/valuables. Do not wear nail polish, gel polish, artificial nails, or any other type of covering on natural nails (fingers and toes) Do not wear jewelry or makeup Put on clean/comfortable clothes.  Please brush your teeth.  Ask your nurse before applying any prescription medications to the skin.     CHG Compatible Lotions   Aveeno Moisturizing lotion  Cetaphil Moisturizing Cream  Cetaphil Moisturizing Lotion  Clairol Herbal Essence Moisturizing Lotion, Dry Skin  Clairol Herbal Essence Moisturizing Lotion, Extra Dry Skin  Clairol Herbal Essence Moisturizing Lotion, Normal Skin  Curel Age Defying Therapeutic Moisturizing Lotion with Alpha Hydroxy  Curel Extreme Care Body Lotion  Curel Soothing Hands Moisturizing Hand Lotion  Curel Therapeutic Moisturizing Cream, Fragrance-Free  Curel Therapeutic Moisturizing Lotion, Fragrance-Free  Curel Therapeutic Moisturizing Lotion, Original Formula  Eucerin Daily Replenishing Lotion  Eucerin Dry Skin Therapy Plus Alpha Hydroxy Crme  Eucerin Dry Skin  Therapy Plus Alpha Hydroxy Lotion  Eucerin Original Crme  Eucerin Original Lotion  Eucerin Plus Crme Eucerin Plus Lotion  Eucerin TriLipid Replenishing Lotion  Keri Anti-Bacterial Hand Lotion  Keri Deep Conditioning Original Lotion Dry Skin Formula Softly Scented  Keri Deep Conditioning Original Lotion, Fragrance Free Sensitive Skin Formula  Keri Lotion Fast Absorbing Fragrance Free Sensitive Skin Formula  Keri Lotion Fast Absorbing Softly Scented Dry Skin Formula  Keri Original Lotion  Keri Skin Renewal Lotion Keri Silky Smooth Lotion  Keri Silky Smooth Sensitive Skin Lotion  Nivea Body Creamy Conditioning Oil  Nivea Body Extra Enriched Lotion  Nivea Body Original Lotion  Nivea Body Sheer Moisturizing Lotion Nivea Crme  Nivea Skin Firming Lotion  NutraDerm 30 Skin Lotion  NutraDerm Skin Lotion  NutraDerm Therapeutic Skin Cream  NutraDerm Therapeutic Skin Lotion  ProShield Protective Hand Cream  Provon moisturizing lotion  Please read over the following fact sheets that you were given.

## 2023-04-29 ENCOUNTER — Other Ambulatory Visit: Payer: Self-pay

## 2023-04-29 ENCOUNTER — Encounter (HOSPITAL_COMMUNITY)
Admission: RE | Admit: 2023-04-29 | Discharge: 2023-04-29 | Disposition: A | Discharge status: Home / Self Care | Payer: 59 | Source: Ambulatory Visit | Attending: Orthopedic Surgery | Admitting: Orthopedic Surgery

## 2023-04-29 ENCOUNTER — Encounter (HOSPITAL_COMMUNITY): Payer: Self-pay

## 2023-04-29 VITALS — BP 138/86 | HR 72 | Temp 98.1°F | Resp 18 | Ht 72.0 in | Wt 206.6 lb

## 2023-04-29 DIAGNOSIS — Z01818 Encounter for other preprocedural examination: Secondary | ICD-10-CM

## 2023-04-29 DIAGNOSIS — Z01812 Encounter for preprocedural laboratory examination: Secondary | ICD-10-CM | POA: Diagnosis present

## 2023-04-29 LAB — TYPE AND SCREEN
ABO/RH(D): A POS
Antibody Screen: NEGATIVE

## 2023-04-29 LAB — CBC
HCT: 44.5 % (ref 39.0–52.0)
Hemoglobin: 15 g/dL (ref 13.0–17.0)
MCH: 31.4 pg (ref 26.0–34.0)
MCHC: 33.7 g/dL (ref 30.0–36.0)
MCV: 93.1 fL (ref 80.0–100.0)
Platelets: 306 10*3/uL (ref 150–400)
RBC: 4.78 MIL/uL (ref 4.22–5.81)
RDW: 13.4 % (ref 11.5–15.5)
WBC: 5.6 10*3/uL (ref 4.0–10.5)
nRBC: 0 % (ref 0.0–0.2)

## 2023-04-29 LAB — BASIC METABOLIC PANEL WITH GFR
Anion gap: 6 (ref 5–15)
BUN: 14 mg/dL (ref 8–23)
CO2: 25 mmol/L (ref 22–32)
Calcium: 9.5 mg/dL (ref 8.9–10.3)
Chloride: 106 mmol/L (ref 98–111)
Creatinine, Ser: 0.99 mg/dL (ref 0.61–1.24)
GFR, Estimated: >60 mL/min
Glucose, Bld: 101 mg/dL — ABNORMAL HIGH (ref 70–99)
Potassium: 4.1 mmol/L (ref 3.5–5.1)
Sodium: 137 mmol/L (ref 135–145)

## 2023-04-29 LAB — SURGICAL PCR SCREEN
MRSA, PCR: NEGATIVE
Staphylococcus aureus: NEGATIVE

## 2023-04-29 NOTE — Progress Notes (Signed)
PCP - Dr. Nicoletta Ba Cardiologist - denies  PPM/ICD - denies Device Orders - na Rep Notified - na  Chest x-ray - na EKG - na Stress Test -  ECHO -  Cardiac Cath -   Sleep Study - denies CPAP - na  Non-diabetic  Blood Thinner Instructions: denies Aspirin Instructions:denies  ERAS Protcol - Clears until 0730  COVID TEST- na  Anesthesia review: No   Patient denies shortness of breath, fever, cough and chest pain at PAT appointment   All instructions explained to the patient, with a verbal understanding of the material. Patient agrees to go over the instructions while at home for a better understanding. Patient also instructed to self quarantine after being tested for COVID-19. The opportunity to ask questions was provided.

## 2023-05-06 ENCOUNTER — Encounter (HOSPITAL_COMMUNITY)
Admission: RE | Disposition: A | Discharge status: Home / Self Care | Payer: Self-pay | Source: Home / Self Care | Attending: Orthopedic Surgery

## 2023-05-06 ENCOUNTER — Other Ambulatory Visit: Payer: Self-pay

## 2023-05-06 ENCOUNTER — Inpatient Hospital Stay (HOSPITAL_COMMUNITY): Payer: 59

## 2023-05-06 ENCOUNTER — Inpatient Hospital Stay (HOSPITAL_COMMUNITY): Payer: 59 | Admitting: Anesthesiology

## 2023-05-06 ENCOUNTER — Inpatient Hospital Stay (HOSPITAL_COMMUNITY): Payer: Self-pay | Admitting: Anesthesiology

## 2023-05-06 ENCOUNTER — Inpatient Hospital Stay (HOSPITAL_COMMUNITY)
Admission: RE | Admit: 2023-05-06 | Discharge: 2023-05-07 | DRG: 451 | Disposition: A | Discharge status: Home / Self Care | Payer: 59 | Attending: Orthopedic Surgery | Admitting: Orthopedic Surgery

## 2023-05-06 ENCOUNTER — Encounter (HOSPITAL_COMMUNITY): Payer: Self-pay | Admitting: Orthopedic Surgery

## 2023-05-06 DIAGNOSIS — G629 Polyneuropathy, unspecified: Secondary | ICD-10-CM | POA: Diagnosis present

## 2023-05-06 DIAGNOSIS — M4317 Spondylolisthesis, lumbosacral region: Secondary | ICD-10-CM | POA: Diagnosis present

## 2023-05-06 DIAGNOSIS — E785 Hyperlipidemia, unspecified: Secondary | ICD-10-CM | POA: Diagnosis present

## 2023-05-06 DIAGNOSIS — Z888 Allergy status to other drugs, medicaments and biological substances status: Secondary | ICD-10-CM | POA: Diagnosis not present

## 2023-05-06 DIAGNOSIS — Z79899 Other long term (current) drug therapy: Secondary | ICD-10-CM | POA: Diagnosis not present

## 2023-05-06 DIAGNOSIS — Z85820 Personal history of malignant melanoma of skin: Secondary | ICD-10-CM | POA: Diagnosis not present

## 2023-05-06 DIAGNOSIS — M4316 Spondylolisthesis, lumbar region: Secondary | ICD-10-CM | POA: Diagnosis not present

## 2023-05-06 DIAGNOSIS — M4807 Spinal stenosis, lumbosacral region: Principal | ICD-10-CM | POA: Diagnosis present

## 2023-05-06 DIAGNOSIS — M48061 Spinal stenosis, lumbar region without neurogenic claudication: Secondary | ICD-10-CM

## 2023-05-06 DIAGNOSIS — Z85828 Personal history of other malignant neoplasm of skin: Secondary | ICD-10-CM

## 2023-05-06 DIAGNOSIS — M5116 Intervertebral disc disorders with radiculopathy, lumbar region: Secondary | ICD-10-CM | POA: Diagnosis present

## 2023-05-06 DIAGNOSIS — Z8546 Personal history of malignant neoplasm of prostate: Secondary | ICD-10-CM | POA: Diagnosis not present

## 2023-05-06 DIAGNOSIS — N529 Male erectile dysfunction, unspecified: Secondary | ICD-10-CM | POA: Diagnosis present

## 2023-05-06 DIAGNOSIS — Z981 Arthrodesis status: Principal | ICD-10-CM

## 2023-05-06 DIAGNOSIS — M5416 Radiculopathy, lumbar region: Secondary | ICD-10-CM | POA: Diagnosis not present

## 2023-05-06 HISTORY — PX: TRANSFORAMINAL LUMBAR INTERBODY FUSION (TLIF) WITH PEDICLE SCREW FIXATION 1 LEVEL: SHX6141

## 2023-05-06 LAB — GLUCOSE, CAPILLARY: Glucose-Capillary: 124 mg/dL — ABNORMAL HIGH (ref 70–99)

## 2023-05-06 SURGERY — TRANSFORAMINAL LUMBAR INTERBODY FUSION (TLIF) WITH PEDICLE SCREW FIXATION 1 LEVEL
Anesthesia: General

## 2023-05-06 MED ORDER — ACETAMINOPHEN 325 MG PO TABS
650.0000 mg | ORAL_TABLET | ORAL | Status: DC | PRN
Start: 2023-05-06 — End: 2023-05-08

## 2023-05-06 MED ORDER — HYDROMORPHONE HCL 1 MG/ML IJ SOLN
INTRAMUSCULAR | Status: DC | PRN
  Administered 2023-05-06 (×2): .5 mg via INTRAVENOUS

## 2023-05-06 MED ORDER — HYDROMORPHONE HCL 1 MG/ML IJ SOLN
INTRAMUSCULAR | Status: AC
  Filled 2023-05-06: qty 0.5

## 2023-05-06 MED ORDER — CEFAZOLIN SODIUM-DEXTROSE 1-4 GM/50ML-% IV SOLN
1.0000 g | Freq: Three times a day (TID) | INTRAVENOUS | Status: AC
  Administered 2023-05-06 – 2023-05-07 (×2): 1 g via INTRAVENOUS
  Filled 2023-05-06 (×2): qty 50

## 2023-05-06 MED ORDER — SUGAMMADEX SODIUM 200 MG/2ML IV SOLN
INTRAVENOUS | Status: DC | PRN
  Administered 2023-05-06: 200 mg via INTRAVENOUS

## 2023-05-06 MED ORDER — PHENOL 1.4 % MT LIQD: 1.0000 | OROMUCOSAL | Status: DC | PRN

## 2023-05-06 MED ORDER — SODIUM CHLORIDE 0.9 % IV SOLN: 250.0000 mL | INTRAVENOUS | Status: AC

## 2023-05-06 MED ORDER — ROCURONIUM BROMIDE 10 MG/ML (PF) SYRINGE
PREFILLED_SYRINGE | INTRAVENOUS | Status: DC | PRN
  Administered 2023-05-06: 60 mg via INTRAVENOUS

## 2023-05-06 MED ORDER — SODIUM CHLORIDE 0.9 % IV SOLN
0.1500 ug/kg/min | INTRAVENOUS | Status: DC
  Filled 2023-05-06: qty 2000

## 2023-05-06 MED ORDER — POLYETHYLENE GLYCOL 3350 17 G PO PACK: 17.0000 g | PACK | Freq: Every day | ORAL | Status: DC | PRN

## 2023-05-06 MED ORDER — DEXAMETHASONE SODIUM PHOSPHATE 10 MG/ML IJ SOLN
INTRAMUSCULAR | Status: DC | PRN
  Administered 2023-05-06: 10 mg via INTRAVENOUS

## 2023-05-06 MED ORDER — LACTATED RINGERS IV SOLN: INTRAVENOUS | Status: DC

## 2023-05-06 MED ORDER — TRANEXAMIC ACID-NACL 1000-0.7 MG/100ML-% IV SOLN
1000.0000 mg | INTRAVENOUS | Status: AC
  Administered 2023-05-06: 1000 mg via INTRAVENOUS
  Filled 2023-05-06: qty 100

## 2023-05-06 MED ORDER — PROPOFOL 10 MG/ML IV BOLUS
INTRAVENOUS | Status: DC | PRN
  Administered 2023-05-06: 200 mg via INTRAVENOUS

## 2023-05-06 MED ORDER — FENTANYL CITRATE (PF) 250 MCG/5ML IJ SOLN
INTRAMUSCULAR | Status: AC
  Filled 2023-05-06: qty 5

## 2023-05-06 MED ORDER — ORAL CARE MOUTH RINSE: 15.0000 mL | OROMUCOSAL | Status: DC | PRN

## 2023-05-06 MED ORDER — 0.9 % SODIUM CHLORIDE (POUR BTL) OPTIME
TOPICAL | Status: DC | PRN
  Administered 2023-05-06 (×2): 1000 mL

## 2023-05-06 MED ORDER — ONDANSETRON HCL 4 MG/2ML IJ SOLN: 4.0000 mg | Freq: Four times a day (QID) | INTRAMUSCULAR | Status: DC | PRN

## 2023-05-06 MED ORDER — OXYCODONE-ACETAMINOPHEN 10-325 MG PO TABS: 1.0000 | ORAL_TABLET | Freq: Four times a day (QID) | ORAL | 0 refills | Status: AC | PRN

## 2023-05-06 MED ORDER — ORAL CARE MOUTH RINSE: 15.0000 mL | Freq: Once | OROMUCOSAL | Status: AC

## 2023-05-06 MED ORDER — BUPIVACAINE-EPINEPHRINE 0.25% -1:200000 IJ SOLN
INTRAMUSCULAR | Status: DC | PRN
  Administered 2023-05-06: 10 mL

## 2023-05-06 MED ORDER — PROPOFOL 10 MG/ML IV BOLUS
INTRAVENOUS | Status: AC
  Filled 2023-05-06: qty 20

## 2023-05-06 MED ORDER — CHLORHEXIDINE GLUCONATE 0.12 % MT SOLN
15.0000 mL | Freq: Once | OROMUCOSAL | Status: AC
  Administered 2023-05-06: 15 mL via OROMUCOSAL
  Filled 2023-05-06: qty 15

## 2023-05-06 MED ORDER — ACETAMINOPHEN 10 MG/ML IV SOLN: INTRAVENOUS | Status: DC | PRN

## 2023-05-06 MED ORDER — OXYCODONE HCL 5 MG PO TABS: 5.0000 mg | ORAL_TABLET | Freq: Once | ORAL | Status: DC | PRN

## 2023-05-06 MED ORDER — PHENYLEPHRINE HCL-NACL 20-0.9 MG/250ML-% IV SOLN
INTRAVENOUS | Status: DC | PRN
  Administered 2023-05-06: 45 ug/min via INTRAVENOUS

## 2023-05-06 MED ORDER — CELECOXIB 200 MG PO CAPS
200.0000 mg | ORAL_CAPSULE | Freq: Once | ORAL | Status: AC
  Administered 2023-05-06: 200 mg via ORAL
  Filled 2023-05-06: qty 1

## 2023-05-06 MED ORDER — BUPIVACAINE-EPINEPHRINE (PF) 0.25% -1:200000 IJ SOLN
INTRAMUSCULAR | Status: AC
  Filled 2023-05-06: qty 30

## 2023-05-06 MED ORDER — ONDANSETRON HCL 4 MG PO TABS: 4.0000 mg | ORAL_TABLET | Freq: Four times a day (QID) | ORAL | Status: DC | PRN

## 2023-05-06 MED ORDER — ACETAMINOPHEN 325 MG PO TABS: 325.0000 mg | ORAL_TABLET | ORAL | Status: DC | PRN

## 2023-05-06 MED ORDER — PROPOFOL 500 MG/50ML IV EMUL
INTRAVENOUS | Status: DC | PRN
  Administered 2023-05-06: 150 ug/kg/min via INTRAVENOUS

## 2023-05-06 MED ORDER — METHOCARBAMOL 1000 MG/10ML IJ SOLN
500.0000 mg | Freq: Four times a day (QID) | INTRAMUSCULAR | Status: DC | PRN
Start: 2023-05-06 — End: 2023-05-07

## 2023-05-06 MED ORDER — CEFAZOLIN SODIUM-DEXTROSE 2-4 GM/100ML-% IV SOLN
2.0000 g | INTRAVENOUS | Status: AC
  Administered 2023-05-06: 2 g via INTRAVENOUS
  Filled 2023-05-06: qty 100

## 2023-05-06 MED ORDER — METHOCARBAMOL 500 MG PO TABS: 500.0000 mg | ORAL_TABLET | Freq: Three times a day (TID) | ORAL | 0 refills | Status: AC | PRN

## 2023-05-06 MED ORDER — HYDROMORPHONE HCL 1 MG/ML IJ SOLN
1.0000 mg | INTRAMUSCULAR | Status: DC | PRN
Start: 2023-05-06 — End: 2023-05-07

## 2023-05-06 MED ORDER — ONDANSETRON HCL 4 MG PO TABS: 4.0000 mg | ORAL_TABLET | Freq: Three times a day (TID) | ORAL | 0 refills | Status: DC | PRN

## 2023-05-06 MED ORDER — ACETAMINOPHEN 500 MG PO TABS
1000.0000 mg | ORAL_TABLET | Freq: Once | ORAL | Status: AC
  Administered 2023-05-06: 1000 mg via ORAL
  Filled 2023-05-06: qty 2

## 2023-05-06 MED ORDER — SODIUM CHLORIDE 0.9 % IV SOLN
0.1500 ug/kg/min | INTRAVENOUS | Status: DC
  Administered 2023-05-06: .15 ug/kg/min via INTRAVENOUS
  Administered 2023-05-06: .2 ug/kg/min via INTRAVENOUS
  Filled 2023-05-06 (×2): qty 2000

## 2023-05-06 MED ORDER — LIDOCAINE 2% (20 MG/ML) 5 ML SYRINGE
INTRAMUSCULAR | Status: DC | PRN
  Administered 2023-05-06: 100 mg via INTRAVENOUS

## 2023-05-06 MED ORDER — GABAPENTIN 300 MG PO CAPS
300.0000 mg | ORAL_CAPSULE | ORAL | Status: AC
  Administered 2023-05-06: 300 mg via ORAL
  Filled 2023-05-06: qty 1

## 2023-05-06 MED ORDER — MENTHOL 3 MG MT LOZG
1.0000 | LOZENGE | OROMUCOSAL | Status: DC | PRN
Start: 2023-05-06 — End: 2023-05-08

## 2023-05-06 MED ORDER — THROMBIN 20000 UNITS EX SOLR
CUTANEOUS | Status: AC
  Filled 2023-05-06: qty 20000

## 2023-05-06 MED ORDER — FENTANYL CITRATE (PF) 250 MCG/5ML IJ SOLN
INTRAMUSCULAR | Status: DC | PRN
  Administered 2023-05-06 (×2): 100 ug via INTRAVENOUS
  Administered 2023-05-06: 50 ug via INTRAVENOUS

## 2023-05-06 MED ORDER — OXYCODONE HCL 5 MG PO TABS
5.0000 mg | ORAL_TABLET | ORAL | Status: DC | PRN
  Administered 2023-05-06: 5 mg via ORAL
  Filled 2023-05-06: qty 1

## 2023-05-06 MED ORDER — MEPERIDINE HCL 25 MG/ML IJ SOLN: 6.2500 mg | INTRAMUSCULAR | Status: DC | PRN

## 2023-05-06 MED ORDER — LACTATED RINGERS IV SOLN: INTRAVENOUS | Status: AC

## 2023-05-06 MED ORDER — MIDAZOLAM HCL 2 MG/2ML IJ SOLN
INTRAMUSCULAR | Status: DC | PRN
  Administered 2023-05-06: 2 mg via INTRAVENOUS

## 2023-05-06 MED ORDER — CHLORHEXIDINE GLUCONATE CLOTH 2 % EX PADS
6.0000 | MEDICATED_PAD | Freq: Every day | CUTANEOUS | Status: DC
Start: 2023-05-07 — End: 2023-05-08
  Administered 2023-05-07: 6 via TOPICAL

## 2023-05-06 MED ORDER — ACETAMINOPHEN 650 MG RE SUPP: 650.0000 mg | RECTAL | Status: DC | PRN

## 2023-05-06 MED ORDER — ONDANSETRON HCL 4 MG/2ML IJ SOLN
INTRAMUSCULAR | Status: DC | PRN
  Administered 2023-05-06: 4 mg via INTRAVENOUS

## 2023-05-06 MED ORDER — NALOXONE HCL 0.4 MG/ML IJ SOLN
INTRAMUSCULAR | Status: AC
  Filled 2023-05-06: qty 1

## 2023-05-06 MED ORDER — NALOXONE HCL 0.4 MG/ML IJ SOLN
0.4000 mg | INTRAMUSCULAR | Status: DC | PRN
  Administered 2023-05-06: 0.4 mg via INTRAVENOUS

## 2023-05-06 MED ORDER — PROPOFOL 1000 MG/100ML IV EMUL
INTRAVENOUS | Status: AC
  Filled 2023-05-06: qty 100

## 2023-05-06 MED ORDER — OXYCODONE HCL 5 MG PO TABS
10.0000 mg | ORAL_TABLET | ORAL | Status: DC | PRN
Start: 2023-05-06 — End: 2023-05-08
  Administered 2023-05-07 (×3): 10 mg via ORAL
  Filled 2023-05-06 (×3): qty 2

## 2023-05-06 MED ORDER — METHOCARBAMOL 500 MG PO TABS
500.0000 mg | ORAL_TABLET | Freq: Four times a day (QID) | ORAL | Status: DC | PRN
  Administered 2023-05-07: 500 mg via ORAL
  Filled 2023-05-06: qty 1

## 2023-05-06 MED ORDER — OXYCODONE HCL 5 MG/5ML PO SOLN: 5.0000 mg | Freq: Once | ORAL | Status: DC | PRN

## 2023-05-06 MED ORDER — ACETAMINOPHEN 160 MG/5ML PO SOLN: 325.0000 mg | ORAL | Status: DC | PRN

## 2023-05-06 MED ORDER — FENTANYL CITRATE (PF) 100 MCG/2ML IJ SOLN: 25.0000 ug | INTRAMUSCULAR | Status: DC | PRN

## 2023-05-06 MED ORDER — SODIUM CHLORIDE 0.9% FLUSH
3.0000 mL | Freq: Two times a day (BID) | INTRAVENOUS | Status: DC
  Administered 2023-05-06 – 2023-05-07 (×2): 3 mL via INTRAVENOUS

## 2023-05-06 MED ORDER — MAGNESIUM CITRATE PO SOLN: 1.0000 | Freq: Once | ORAL | Status: DC | PRN

## 2023-05-06 MED ORDER — ONDANSETRON HCL 4 MG/2ML IJ SOLN: 4.0000 mg | Freq: Once | INTRAMUSCULAR | Status: DC | PRN

## 2023-05-06 MED ORDER — MIDAZOLAM HCL 2 MG/2ML IJ SOLN
INTRAMUSCULAR | Status: AC
  Filled 2023-05-06: qty 2

## 2023-05-06 MED ORDER — SODIUM CHLORIDE 0.9% FLUSH: 3.0000 mL | INTRAVENOUS | Status: DC | PRN

## 2023-05-06 MED ORDER — THROMBIN 20000 UNITS EX SOLR
CUTANEOUS | Status: DC | PRN
  Administered 2023-05-06: 20 mL via TOPICAL

## 2023-05-06 SURGICAL SUPPLY — 71 items
BAG COUNTER SPONGE SURGICOUNT (BAG) ×1 IMPLANT
BLADE BONE MILL MEDIUM (MISCELLANEOUS) IMPLANT
BLADE CLIPPER SURG (BLADE) IMPLANT
BUR EGG ELITE 4.0 (BURR) ×1 IMPLANT
BUR MATCHSTICK NEURO 3.0 LAGG (BURR) ×1 IMPLANT
CAGE MOD EX PL 7X9X24 17D (Cage) IMPLANT
CANISTER SUCT 3000ML PPV (MISCELLANEOUS) ×1 IMPLANT
CAP RELINE MOD TULIP RMM (Cap) IMPLANT
CLSR STERI-STRIP ANTIMIC 1/2X4 (GAUZE/BANDAGES/DRESSINGS) ×1 IMPLANT
COVER SURGICAL LIGHT HANDLE (MISCELLANEOUS) ×1 IMPLANT
DRAIN CHANNEL 15F RND FF W/TCR (WOUND CARE) IMPLANT
DRAPE C-ARM 42X72 X-RAY (DRAPES) ×1 IMPLANT
DRAPE C-ARMOR (DRAPES) ×1 IMPLANT
DRAPE POUCH INSTRU U-SHP 10X18 (DRAPES) IMPLANT
DRAPE SURG 17X23 STRL (DRAPES) ×1 IMPLANT
DRAPE U-SHAPE 47X51 STRL (DRAPES) ×1 IMPLANT
DRSG OPSITE POSTOP 4X6 (GAUZE/BANDAGES/DRESSINGS) IMPLANT
DRSG OPSITE POSTOP 4X8 (GAUZE/BANDAGES/DRESSINGS) ×1 IMPLANT
DURAPREP 26ML APPLICATOR (WOUND CARE) ×1 IMPLANT
ELECT BLADE 6.5 EXT (BLADE) ×1 IMPLANT
ELECT NVM5 SURFACE MEP/EMG (ELECTRODE) IMPLANT
ELECT PENCIL ROCKER SW 15FT (MISCELLANEOUS) ×1 IMPLANT
ELECT REM PT RETURN 9FT ADLT (ELECTROSURGICAL) ×1
ELECTRODE REM PT RTRN 9FT ADLT (ELECTROSURGICAL) ×1 IMPLANT
GEL DBM PROPEL 1ML (Putty) IMPLANT
GLOVE BIOGEL PI IND STRL 8.5 (GLOVE) ×1 IMPLANT
GLOVE SS BIOGEL STRL SZ 8.5 (GLOVE) ×1 IMPLANT
GOWN STRL REUS W/TWL 2XL LVL3 (GOWN DISPOSABLE) ×1 IMPLANT
KIT BASIN OR (CUSTOM PROCEDURE TRAY) ×1 IMPLANT
KIT POSITION SURG JACKSON T1 (MISCELLANEOUS) IMPLANT
KIT TURNOVER KIT B (KITS) ×1 IMPLANT
MODULE EMG NDL SSEP NVM5 (NEUROSURGERY SUPPLIES) IMPLANT
MODULE EMG NEEDLE SSEP NVM5 (NEUROSURGERY SUPPLIES) ×1 IMPLANT
NDL 22X1.5 STRL (OR ONLY) (MISCELLANEOUS) IMPLANT
NDL SPNL 18GX3.5 QUINCKE PK (NEEDLE) ×1 IMPLANT
NEEDLE 22X1.5 STRL (OR ONLY) (MISCELLANEOUS) IMPLANT
NEEDLE SPNL 18GX3.5 QUINCKE PK (NEEDLE) ×1 IMPLANT
NS IRRIG 1000ML POUR BTL (IV SOLUTION) ×1 IMPLANT
PACK LAMINECTOMY ORTHO (CUSTOM PROCEDURE TRAY) ×1 IMPLANT
PACK UNIVERSAL I (CUSTOM PROCEDURE TRAY) ×1 IMPLANT
PAD ARMBOARD 7.5X6 YLW CONV (MISCELLANEOUS) ×2 IMPLANT
PATTIES SURGICAL .5 X.5 (GAUZE/BANDAGES/DRESSINGS) IMPLANT
PATTIES SURGICAL .5 X1 (DISPOSABLE) ×1 IMPLANT
POSITIONER HEAD PRONE TRACH (MISCELLANEOUS) ×1 IMPLANT
PROBE BALL TIP NVM5 SNG USE (NEUROSURGERY SUPPLIES) IMPLANT
ROD RELINE 5.0X45MM (Rod) IMPLANT
ROD RELINE COCR LORD 5X40MM (Rod) IMPLANT
SCREW LOCK RSS 4.5/5.0MM (Screw) IMPLANT
SCREW SHANK RELINE MOD 6.5X35 (Screw) IMPLANT
SHANK RELINE MOD 5.5X40 (Screw) IMPLANT
SPONGE SURGIFOAM ABS GEL 100 (HEMOSTASIS) ×1 IMPLANT
SPONGE T-LAP 4X18 ~~LOC~~+RFID (SPONGE) ×3 IMPLANT
SURGIFLO W/THROMBIN 8M KIT (HEMOSTASIS) ×1 IMPLANT
SUT BONE WAX W31G (SUTURE) ×1 IMPLANT
SUT MNCRL AB 3-0 PS2 27 (SUTURE) ×2 IMPLANT
SUT MNCRL+ AB 3-0 CT1 36 (SUTURE) ×1 IMPLANT
SUT STRATAFIX 1PDS 45CM VIOLET (SUTURE) IMPLANT
SUT VIC AB 0 CT1 27XBRD ANTBC (SUTURE) ×1 IMPLANT
SUT VIC AB 1 CT1 18XCR BRD 8 (SUTURE) ×1 IMPLANT
SUT VIC AB 2-0 CT1 18 (SUTURE) ×1 IMPLANT
SUT VIC AB 2-0 CT1 TAPERPNT 27 (SUTURE) IMPLANT
SYR 3ML LL SCALE MARK (SYRINGE) IMPLANT
SYR BULB IRRIG 60ML STRL (SYRINGE) ×1 IMPLANT
SYR CONTROL 10ML LL (SYRINGE) ×1 IMPLANT
TIP CART INSTAFILL GL 5CC (ORTHOPEDIC DISPOSABLE SUPPLIES) IMPLANT
TIP CONICAL INSTAFILL (ORTHOPEDIC DISPOSABLE SUPPLIES) IMPLANT
TOWEL GREEN STERILE (TOWEL DISPOSABLE) ×1 IMPLANT
TOWEL GREEN STERILE FF (TOWEL DISPOSABLE) ×1 IMPLANT
TRAY FOLEY MTR SLVR 16FR STAT (SET/KITS/TRAYS/PACK) ×1 IMPLANT
WATER STERILE IRR 1000ML POUR (IV SOLUTION) ×1 IMPLANT
YANKAUER SUCT BULB TIP NO VENT (SUCTIONS) ×1 IMPLANT

## 2023-05-06 NOTE — Op Note (Signed)
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  7 x 9 x 24.  Anterior height expanded to 9.5 and posterior height 8.5.  17 degrees overall lordosis.  5.5 x 40 mm length cortical pedicle screws at L5.  6.5 x 35 mm length screws at S1.    Neuromonitoring: No adverse free running EMG activity.  Normal SSEP activity.  All 4 screws were directly stimulated.  Right side: No activity greater than 40 mA at L5 and positive activity at 21 mA at S1.  Left side: Positive activity at 36 mA at L5 and 18 mA at S1.  No activity to suggest breach of the pedicle and nerve irritation.  Graft: Local bone graft from decompression and DBX  BRIEF HISTORY: Rodney Watkins is a 67 y.o. male who has had debilitating back buttock and radicular leg pain.  Despite appropriate conservative management his overall quality of life is continued to deteriorate.  As result of the spondylolisthesis and significant pain especially to the left lower extremity we elected to move forward with a TLIF.  All appropriate risks, benefits, and alternatives were discussed with the patient and consent was obtained.  PROCEDURE DETAILS: Patient was brought into the operating room and was properly positioned on the operating room table.  After induction with general anesthesia the patient was endotracheally intubated.  A timeout was taken to confirm all important data: including patient, procedure, and the level. Teds, SCD's were applied.   Foley was placed by the nurse, and the neuromonitoring  representative placed all appropriate needles for as SSEP and EMG monitoring.  The patient has been.  Prone onto the [facility] spine frame and all bony prominences were well-padded.  The back was now prepped and draped in a dependent fashion.  Using fluoroscopy I marked out my incision site spanning from the superior aspect of the L5 pedicle to the inferior aspect of the S1 pedicle.  I infiltrated my incision with quarter Marcaine with epinephrine.  Longitudinal incision was made and sharp dissection was carried out down to the fascia.  I incised the deep fascia and stripped the paraspinal muscles to expose the L5 and S1 spinous processes as well as the lamina.  The L4-5 and L5-S1 facet capsules were also identified.  The defects were also now clearly visible.  At this point posterior elements of L5 were freely mobile.  With the approach complete I proceeded with the instrumentation.  High-speed bur was placed on the inferior medial corner of the L5 pedicle as seen on imaging and I breach the cortex.  The pedicle awl was then placed and advanced towards the superior lateral corner of the pedicle of L5.  I confirmed trajectory on the AP plane.  As I neared the lateral third of the pedicle I switched to the lateral view to confirm there was just beyond the posterior wall of the vertebral body.  Once I confirm satisfactory trajectory and position I advanced into the vertebral body.  The awl was then removed and I palpated the hole with a ball-tipped feeler.  I then placed a 5.5 tap  and repalpated with a ball-tipped feeler.  The pedicle screw tract was intact there was no breach.  The 5.5 x 40 mm length screw was then placed.  Using the exact same technique I placed the same size screw on the contralateral side.  At the medial mid portion of the pedicle is seen on the AP view of S1 I broached the cortex and placed my pedicle awl.  This time I even more straight lateral.  As I advanced into the vertebral body and has a  near the lateral third of the pedicle I confirmed on the lateral view that I was just beyond the posterior wall and that my trajectory was appropriate.  I then removed the pedicle awl and palpated the hole with a ball-tipped feeler.  The 5.5 followed by a 6.5 mm tap was then placed and each time I palpated to ensure that the tract was intact.  I then placed a 6.5 diameter screw.  We had excellent purchase and had the other ones.  I repeated this exact same technique on the contralateral side.  Once all 4 pedicle screws were secured into position I then directly stimulated.  There was no free running EMG activity to suggest breach.  With the fixation complete I proceeded with the decompression.  Using a osteotome I removed the entire left L5 inferior facet.  I then removed the entire L5 spinous process and using Kerrison rongeurs performed a very generous laminotomy of L5 on the left side.  At this point I then dissected through the central raphae of the ligamentum flavum and create a plane between the thecal sac and the ligamentum flavum.  I then used my Kerrison rongeurs to resect the ligamentum flavum and complete my hemilaminectomy on the left side of L5.  I then worked into the lateral recess and identified the superior portion of the S1 pedicle.  I then resected the superior portion of the S1 facet in order to clearly delineate the disc space.  I could now visualize the S1 nerve root and the L5 nerve root.  I resected the cartilaginous material that had formed in the pars to completely decompress the L5 nerve root.  The large epidural veins were identified and coagulated.  A neuro patty was used to protect the exiting and traversing nerve root and aid in my overall exposure.  With the nerve roots protected I performed an annulotomy with a 15 blade scalpel.  Using a combination of pituitary rongeurs various curettes I resected all of the disc material.  Once I had removed the disc material I irrigated the  wound copiously normal saline.  I then used my trialing devices and elected to use the 7 expandable cage.  This provided the best overall fit and was just at the midline as seen on the AP view.  I then packed bone graft from the decompression in the lateral aspect of the disc space and slightly anteriorly.  The thecal sac was retracted and protected as were the nerve roots.  The cage was then gently malleted into position.  I confirmed satisfactory depth and position in both planes.  I then expanded anteriorly and posteriorly to have excellent contact with the endplates.  The inserter was then removed.  X-rays demonstrate satisfactory positioning in both planes of the intervertebral cage.  I then backfilled the cage with DBX.  At this point I moved forward with the arthrodesis.  Using a high-speed bur I decorticated the right posterior lateral gutter.  I  decorticated a portion of the pars as well as the L5-S1 facet complex and the sacral ala.  Once the posterior lateral gutter was prepped I then placed additional bone graft into the posterior lateral gutter.  Polyaxial heads were then secured to the screws and a 40 mm length rod was placed.  The rod had excellent overall positioning and the locking caps were inserted.  Both of these locking caps were tightened according manufacture standards.  With the posterior lateral arthrodesis complete on the side I then went back to the left-hand side irrigated the wound copiously normal saline and checked 1 last time and an adequate decompression of the L 5 and S1 nerve roots.  The polyaxial heads were secured and a 45 mm length rod was placed and the locking caps were inserted.  Both of these locking caps were tightened according to manufacture standards.  All retractors were now removed and final x-rays demonstrate satisfactory positioning of both the implant and the pedicle screw construct in both planes.  The wounds were now copiously irrigated with normal  saline.  Once I confirmed hemostasis I placed a thrombin-soaked Gelfoam patty over the laminectomy site and then closed the deep fascia with interrupted #1 Vicryl sutures.  I then closed superficial with interrupted 2-0 Vicryl sutures followed by 3-0 Monocryl for the skin.  Steri-Strips dry dressings were applied and the patient was ultimately extubated and transferred the PACU without incident.  The end of the case all needle and sponge counts were correct.  There are no adverse intraoperative events.  Venita Lick, MD 05/06/2023 2:38 PM

## 2023-05-06 NOTE — Discharge Instructions (Signed)

## 2023-05-06 NOTE — Anesthesia Preprocedure Evaluation (Signed)
Anesthesia Evaluation  Patient identified by MRN, date of birth, ID band Patient awake    Reviewed: Allergy & Precautions, NPO status , Patient's Chart, lab work & pertinent test results  Airway Mallampati: II  TM Distance: >3 FB Neck ROM: Full    Dental no notable dental hx.    Pulmonary shortness of breath, former smoker   Pulmonary exam normal breath sounds clear to auscultation       Cardiovascular negative cardio ROS Normal cardiovascular exam Rhythm:Regular Rate:Normal     Neuro/Psych  Neuromuscular disease  negative psych ROS   GI/Hepatic negative GI ROS, Neg liver ROS,,,  Endo/Other    Class 3 obesity  Renal/GU Renal disease  negative genitourinary   Musculoskeletal  (+) Arthritis ,    Abdominal   Peds negative pediatric ROS (+)  Hematology negative hematology ROS (+)   Anesthesia Other Findings   Reproductive/Obstetrics negative OB ROS                             Anesthesia Physical Anesthesia Plan  ASA: 3  Anesthesia Plan: General   Post-op Pain Management: Tylenol PO (pre-op)*, Celebrex PO (pre-op)*, Gabapentin PO (pre-op)* and Dilaudid IV   Induction: Intravenous  PONV Risk Score and Plan: 2 and Ondansetron, Dexamethasone, Treatment may vary due to age or medical condition and Midazolam  Airway Management Planned: Oral ETT  Additional Equipment: None  Intra-op Plan:   Post-operative Plan: Extubation in OR  Informed Consent: I have reviewed the patients History and Physical, chart, labs and discussed the procedure including the risks, benefits and alternatives for the proposed anesthesia with the patient or authorized representative who has indicated his/her understanding and acceptance.     Dental advisory given  Plan Discussed with: CRNA and Anesthesiologist  Anesthesia Plan Comments: (  )        Anesthesia Quick Evaluation

## 2023-05-06 NOTE — H&P (Signed)
History:  Rodney Watkins is a very pleasant 67 year old gentleman with persistent back buttock and radicular left leg pain. Despite activity modification, medications, and a supervised home exercise program his quality of life is continued to deteriorate. Patient is unable to perform normal activities of daily living without progressive pain and dysesthesias. As a result we have elected to move forward with a TLIF at L5-S1.  Past Medical History:  Diagnosis Date   Atypical chest pain 05/2019   Insurance denied stress test I ordered.  Pt chose watchful waiting approach at that time.   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   IFG (impaired fasting glucose)    Per old records.  A1c 5.7% 05/2017.  A1c 5.3% 10/2017. A1c 5.9% 06/2020.   Incisional hernia without obstruction or gangrene 05/2017   Kidney stone    Lumbar spondylosis    w/pars defect and listhesis (Dr. Wynetta Emery).  Referred by neurosurg to pain mgmt MD and pt got ESI x 2 ---no help.   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 Truett Perna)   Osteoarthritis 2021   1st MTP R foot   Paresthesias    UEs and LE's 2019---recommended neuro referral and pt wanted to think about it.  Then, Dr. Allena Katz evaluated him 05/2018 and did NCS/EMG-->normal.  The  sensation transitioned to BURNING pain->NCS/EMG normal again 09/2020.   Peripheral neuropathy    small fiber neuropathy (nerve bx)   Prostate cancer (HCC)    Locally advanced prostate ca 06/2016.  Prostatectomy  RAL radical prostatectomy 07/2016.  Biochemical recurrence 2020/2021--salvage RT+ ADT. Undect PSA 02/2020, 09/2020, 12/2021   Pulmonary nodule 2009   Stable CT chest 2010--no further scans needed in pt at low risk for lung ca.   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   Squamous cell carcinoma of skin 08/05/2011   Left Calf (well diff) curet and 5FU)    Allergies  Allergen Reactions   Atorvastatin Other (See Comments)    Myalgias/arthralgias    No current facility-administered medications on file prior to encounter.   Current Outpatient Medications on File Prior to Encounter  Medication Sig Dispense Refill   Alpha-Lipoic Acid 300 MG CAPS Take 300 mg by mouth daily.     b complex vitamins capsule Take 1 capsule by mouth every 3 (three) days.     cetirizine (ZYRTEC) 10 MG tablet Take 10 mg by mouth daily as needed for allergies.     Cholecalciferol 25 MCG (1000 UT) tablet Take 1,000 Units by mouth every 3 (three) days.     ibuprofen (  ADVIL) 200 MG tablet Take 400 mg by mouth every 6 (six) hours as needed.     Magnesium 250 MG TABS Take 250 mg by mouth every 3 (three) days.     Multiple Vitamin (MULTIVITAMIN) tablet Take 1 tablet by mouth daily.     sildenafil (VIAGRA) 100 MG tablet Take 100 mg by mouth as needed for erectile dysfunction.      Physical Exam: Clinical exam: Rodney Watkins is a pleasant individual, who appears younger than their stated age.  He is alert and orientated 3.  No shortness of breath, chest pain.  Abdomen is soft and non-tender, negative loss of bowel and bladder control, no rebound tenderness.  Negative: skin lesions abrasions contusions  Peripheral  pulses: 2+ peripheral pulses bilaterally. LE compartments are: Soft and nontender.  Gait pattern: Slightly altered gait pattern due to neuropathic left leg pain as well as chronic bilateral small fiber peripheral neuropathy in the lower extremities.  Assistive devices: None  Neuro: Positive L5 neuropathic left leg pain with dysesthesias. 5/5 motor strength in the lower extremity bilaterally. Positive bilateral peripheral dysesthesias in the lower extremity consistent with small fiber peripheral neuropathy. Negative Babinski test, no clonus. Symmetrical 2+ deep tendon reflexes.  Musculoskeletal: Significant back buttock and neuropathic left leg pain with rotation and forward flexion of the lumbar spine. Relief with extension of the spine.  Imaging: X-rays of the lumbar spine including flexion-extension views taken today demonstrate grade 1 spondylolisthesis with pars defect at L5-S1. Approximately 5 mm of motion on flexion-extension views. Loss of normal disc space height at this level. Repeat flexion-extension views taken today continue to demonstrate the dynamic instability at L5-S1.  Lumbar MRI: completed on 03/18/2023. Chronic L5 pars defect with grade 1 spondylolisthesis at L5-S1. Significant facet arthrosis and foraminal stenosis is noted at this level. Patient has moderate stenosis causing neural compression left worse than the right at L5-S1. Moderate degenerative changes at L4-5 with mild to moderate foraminal stenosis. Mild degenerative changes L1-4.  A/P:  Rodney Watkins is a very pleasant 67 year old gentleman with progressive back buttock and radicular left leg pain. Patient has an isthmic spondylolisthesis with degenerative lumbar disc disease contributing to foraminal and lateral recess stenosis which is causing L5 radicular leg pain.  The patient has attempted activity modification, physician directed home exercise program, and has been using medications to alleviate the pain. Unfortunately  this has not been helpful. The patient notes that over the last year he has been recovering from recurrent prostate cancer requiring radiation therapy. The radiation therapy can also lead to progressive degenerative disease and worsening back pain.  Imaging studies clearly demonstrate greater than 5 mm of motion at L5-S1. As result if we were to move forward with a decompression there is a higher than likely outcome of progression in his instability which would cause ongoing issues and require another surgery. Therefore as a result of the unstable pattern at L5-S1 I have recommended a TLIF. This would allow for direct neural decompression to address the significant left radicular leg pain and stabilization to prevent worsening of the spondylolisthesis.  I have reviewed all of this with the patient and his wife and all of their questions were addressed. Will also address the risks and benefits of surgery. Risks and benefits of posterior spinal fusion: Infection, bleeding, death, stroke, paralysis, ongoing or worse pain, need for additional surgery, nonunion, leak of spinal fluid, adjacent segment degeneration requiring additional fusion surgery, Injury to abdominal vessels that can require anterior surgery to stop bleeding.  Malposition of the cage and/or pedicle screws that could require additional surgery. Loss of bowel and bladder control. Postoperative hematoma causing neurologic compression that could require urgent or emergent re-operation.

## 2023-05-06 NOTE — Transfer of Care (Signed)
Immediate Anesthesia Transfer of Care Note  Patient: Rodney Watkins  Procedure(s) Performed: TRANSFORAMINAL LUMBAR INTERBODY FUSION (TLIF) WITH PEDICLE SCREW FIXATION 1 LEVEL  L5-S1  Patient Location: PACU  Anesthesia Type:General  Level of Consciousness: sedated  Airway & Oxygen Therapy: Patient Spontanous Breathing  Post-op Assessment: Report given to RN  Post vital signs: Reviewed and stable  Last Vitals:  Vitals Value Taken Time  BP 114/68 05/06/23 1500  Temp    Pulse 89 05/06/23 1504  Resp 11 05/06/23 1504  SpO2 98 % 05/06/23 1504  Vitals shown include unfiled device data.  Last Pain:  Vitals:   05/06/23 0853  PainSc: 0-No pain         Complications: No notable events documented.

## 2023-05-06 NOTE — Anesthesia Postprocedure Evaluation (Signed)
Anesthesia Post Note  Patient: Rodney Watkins  Procedure(s) Performed: TRANSFORAMINAL LUMBAR INTERBODY FUSION (TLIF) WITH PEDICLE SCREW FIXATION 1 LEVEL  L5-S1     Patient location during evaluation: PACU Anesthesia Type: General Level of consciousness: awake and alert Pain management: pain level controlled Vital Signs Assessment: post-procedure vital signs reviewed and stable Respiratory status: spontaneous breathing, nonlabored ventilation, respiratory function stable and patient connected to nasal cannula oxygen Cardiovascular status: blood pressure returned to baseline and stable Postop Assessment: no apparent nausea or vomiting Anesthetic complications: no   No notable events documented.  Last Vitals:  Vitals:   05/06/23 1645 05/06/23 1711  BP: (!) 153/99 (!) 146/90  Pulse: 92 84  Resp: 16 18  Temp: 36.6 C 36.6 C  SpO2: 96% 97%    Last Pain:  Vitals:   05/06/23 1711  TempSrc: Oral  PainSc:                   [place]

## 2023-05-06 NOTE — Brief Op Note (Signed)
05/06/2023  2:51 PM  PATIENT:  Rodney Watkins  67 y.o. male  PRE-OPERATIVE DIAGNOSIS:  isthmic spondylothesis L5-S1 with left L5 radiculopathy  POST-OPERATIVE DIAGNOSIS:  isthmic spondylothesis L5-S1 with left L5 radiculopathy  PROCEDURE:  Procedure(s): TRANSFORAMINAL LUMBAR INTERBODY FUSION (TLIF) WITH PEDICLE SCREW FIXATION 1 LEVEL  L5-S1 (N/A)  SURGEON:  Surgeons and Role:    Venita Lick, MD - Primary  PHYSICIAN ASSISTANT:   ASSISTANTS: Luther Bradley   ANESTHESIA:   general  EBL:  50 mL   BLOOD ADMINISTERED:none  DRAINS: none   LOCAL MEDICATIONS USED:  MARCAINE     SPECIMEN:  No Specimen  DISPOSITION OF SPECIMEN:  N/A  COUNTS:  YES  TOURNIQUET:  * No tourniquets in log *  DICTATION: .Dragon Dictation  PLAN OF CARE: Admit to inpatient   PATIENT DISPOSITION:  PACU - hemodynamically stable.

## 2023-05-07 ENCOUNTER — Encounter (HOSPITAL_COMMUNITY): Payer: Self-pay | Admitting: Orthopedic Surgery

## 2023-05-07 NOTE — Evaluation (Signed)
Occupational Therapy Evaluation Patient Details Name: Rodney Watkins MRN: [id number] DOB: [birth year] Today's Date: 05/07/2023   History of Present Illness Rodney Watkins  67 y.o. male who underwent L5-S1 TLIF 12/26. PMHx: bil CTS, hyperlipidemia, melanoma, arthritis, peripheral neuropathy, prostate cancer,   Clinical Impression   Rodney Watkins was evaluated s/p the above spine surgery. He is indep and lives with family at baseline. Upon evaluation pt was limited by spinal precautions and surgical pain. Overall he demonstrated mod I ability to complete ADLs and mobility without DME. Provided cues and education on spinal precautions and compensatory techniques throughout, handout provided and pt demonstrated great recall during ADLs and mobility. Pt does not require further acute OT services. Recommend d/c home with support of family.         If plan is discharge home, recommend the following: Assistance with cooking/housework;Assist for transportation    Functional Status Assessment  Patient has had a recent decline in their functional status and demonstrates the ability to make significant improvements in function in a reasonable and predictable amount of time.  Equipment Recommendations  None recommended by OT       Precautions / Restrictions Precautions Precautions: Back Precaution Booklet Issued: Yes (comment) Precaution Comments: pt required min cues to not twist during session Required Braces or Orthoses: Spinal Brace Spinal Brace: Lumbar corset;Applied in sitting position Restrictions Weight Bearing Restrictions Per Provider Order: No      Mobility Bed Mobility Overal bed mobility: Needs Assistance             General bed mobility comments: OOB upon arrival    Transfers Overall transfer level: Independent Equipment used: None                      Balance Overall balance assessment: Independent       ADL either performed or assessed with clinical  judgement   ADL Overall ADL's : Modified independent           General ADL Comments: mod I after review of spinal precautions     Vision Baseline Vision/History: 0 No visual deficits Vision Assessment?: No apparent visual deficits     Perception Perception: Within Functional Limits       Praxis Praxis: WFL       Pertinent Vitals/Pain Pain Assessment Pain Assessment: 0-10 Pain Score: 3  Pain Location: back Pain Descriptors / Indicators: Operative site guarding Pain Intervention(s): Limited activity within patient's tolerance, Monitored during session     Extremity/Trunk Assessment Upper Extremity Assessment Upper Extremity Assessment: Overall WFL for tasks assessed   Lower Extremity Assessment Lower Extremity Assessment: Defer to PT evaluation   Cervical / Trunk Assessment Cervical / Trunk Assessment: Back Surgery   Communication Communication Communication: No apparent difficulties Cueing Techniques: Verbal cues   Cognition Arousal: Alert Behavior During Therapy: WFL for tasks assessed/performed Overall Cognitive Status: Within Functional Limits for tasks assessed         General Comments: great recall of spinal precautions     General Comments  VSS            Home Living Family/patient expects to be discharged to:: Private residence Living Arrangements: Spouse/significant other Available Help at Discharge: Family Type of Home: House Home Access: Stairs to enter Secretary/administrator of Steps: 2 Entrance Stairs-Rails: None Home Layout: Two level;Bed/bath upstairs Alternate Level Stairs-Number of Steps: flight   Bathroom Shower/Tub: Producer, television/film/video: Standard     Home Equipment: Crutches;Cane -  single point          Prior Functioning/Environment Prior Level of Function : Independent/Modified Independent;Driving;Working/employed             Mobility Comments: no AD ADLs Comments: indep        OT Problem  List: Decreased strength;Decreased range of motion;Decreased activity tolerance;Impaired balance (sitting and/or standing);Decreased safety awareness;Decreased knowledge of use of DME or AE;Decreased knowledge of precautions         OT Goals(Current goals can be found in the care plan section) Acute Rehab OT Goals Patient Stated Goal: home today OT Goal Formulation: With patient Time For Goal Achievement: 05/07/23 Potential to Achieve Goals: Good   AM-PAC OT "6 Clicks" Daily Activity     Outcome Measure Help from another person eating meals?: None Help from another person taking care of personal grooming?: None Help from another person toileting, which includes using toliet, bedpan, or urinal?: None Help from another person bathing (including washing, rinsing, drying)?: None Help from another person to put on and taking off regular upper body clothing?: None Help from another person to put on and taking off regular lower body clothing?: None 6 Click Score: 24   End of Session Nurse Communication: Mobility status  Activity Tolerance: Patient tolerated treatment well Patient left: in chair;with call bell/phone within reach  OT Visit Diagnosis: Muscle weakness (generalized) (M62.81);Pain                Time: 8295-6213 OT Time Calculation (min): 19 min Charges:  OT General Charges $OT Visit: 1 Visit OT Evaluation $OT Eval Moderate Complexity: 1 Mod  Derenda Mis, OTR/L Acute Rehabilitation Services Office [phone] Secure Chat Communication Preferred   Donia Pounds 05/07/2023, 10:40 AM

## 2023-05-07 NOTE — Plan of Care (Signed)
  Problem: Education: Goal: Knowledge of General Education information will improve Description: Including pain rating scale, medication(s)/side effects and non-pharmacologic comfort measures Outcome: Adequate for Discharge   Problem: Health Behavior/Discharge Planning: Goal: Ability to manage health-related needs will improve Outcome: Adequate for Discharge   Problem: Clinical Measurements: Goal: Ability to maintain clinical measurements within normal limits will improve Outcome: Adequate for Discharge Goal: Will remain free from infection Outcome: Adequate for Discharge Goal: Diagnostic test results will improve Outcome: Adequate for Discharge Goal: Respiratory complications will improve Outcome: Adequate for Discharge Goal: Cardiovascular complication will be avoided Outcome: Adequate for Discharge   Problem: Activity: Goal: Risk for activity intolerance will decrease Outcome: Adequate for Discharge   Problem: Nutrition: Goal: Adequate nutrition will be maintained Outcome: Adequate for Discharge   Problem: Coping: Goal: Level of anxiety will decrease Outcome: Adequate for Discharge   Problem: Elimination: Goal: Will not experience complications related to bowel motility Outcome: Adequate for Discharge Goal: Will not experience complications related to urinary retention Outcome: Adequate for Discharge   Problem: Pain Management: Goal: General experience of comfort will improve Outcome: Adequate for Discharge   Problem: Safety: Goal: Ability to remain free from injury will improve Outcome: Adequate for Discharge   Problem: Skin Integrity: Goal: Risk for impaired skin integrity will decrease Outcome: Adequate for Discharge   Problem: Education: Goal: Ability to verbalize activity precautions or restrictions will improve Outcome: Adequate for Discharge Goal: Knowledge of the prescribed therapeutic regimen will improve Outcome: Adequate for Discharge Goal:  Understanding of discharge needs will improve Outcome: Adequate for Discharge   Problem: Activity: Goal: Ability to avoid complications of mobility impairment will improve Outcome: Adequate for Discharge Goal: Ability to tolerate increased activity will improve Outcome: Adequate for Discharge Goal: Will remain free from falls Outcome: Adequate for Discharge   Problem: Bowel/Gastric: Goal: Gastrointestinal status for postoperative course will improve Outcome: Adequate for Discharge   Problem: Clinical Measurements: Goal: Ability to maintain clinical measurements within normal limits will improve Outcome: Adequate for Discharge Goal: Postoperative complications will be avoided or minimized Outcome: Adequate for Discharge Goal: Diagnostic test results will improve Outcome: Adequate for Discharge   Problem: Pain Management: Goal: Pain level will decrease Outcome: Adequate for Discharge   Problem: Skin Integrity: Goal: Will show signs of wound healing Outcome: Adequate for Discharge   Problem: Health Behavior/Discharge Planning: Goal: Identification of resources available to assist in meeting health care needs will improve Outcome: Adequate for Discharge   Problem: Bladder/Genitourinary: Goal: Urinary functional status for postoperative course will improve Outcome: Adequate for Discharge

## 2023-05-07 NOTE — Progress Notes (Signed)
    Subjective: Procedure(s) (LRB): TRANSFORAMINAL LUMBAR INTERBODY FUSION (TLIF) WITH PEDICLE SCREW FIXATION 1 LEVEL  L5-S1 (N/A) 1 Day Post-Op  Patient reports pain as 2 on 0-10 scale.  Reports decreased leg pain reports incisional back pain   N/A void - foley removed this AM Negative bowel movement Negative flatus Negative chest pain or shortness of breath  Objective: Vital signs in last 24 hours: Temp:  [97.2 F (36.2 C)-97.9 F (36.6 C)] 97.8 F (36.6 C) (12/27 0424) Pulse Rate:  [70-92] 83 (12/27 0424) Resp:  [10-18] 18 (12/27 0424) BP: (114-154)/(68-99) 140/80 (12/27 0424) SpO2:  [92 %-99 %] 97 % (12/27 0424) Weight:  [93 kg] 93 kg (12/26 0847)  Intake/Output from previous day: 12/26 0701 - 12/27 0700 In: 1912.3 [I.V.:1701.7; IV Piggyback:210.6] Out: 1810 [Urine:1760; Blood:50]  Labs: No results for input(s): "WBC", "RBC", "HCT", "PLT" in the last 72 hours. No results for input(s): "NA", "K", "CL", "CO2", "BUN", "CREATININE", "GLUCOSE", "CALCIUM" in the last 72 hours. No results for input(s): "LABPT", "INR" in the last 72 hours.  Physical Exam: Neurologically intact ABD soft Neurovascular intact Intact pulses distally Dorsiflexion/Plantar flexion intact Incision: dressing C/D/I and no drainage Compartment soft Body mass index is 27.8 kg/m.   Assessment/Plan: Patient stable  Continue mobilization with physical therapy Continue care  Advance diet Up with therapy Plan on d/c to home Saturday.  AVS completed - will electronically submit discharge medications  Venita Lick, MD [facility] [phone]

## 2023-05-07 NOTE — TOC CM/SW Note (Signed)
Transition of Care [facility]) - Inpatient Brief Assessment   Patient Details  Name: Rodney Watkins MRN: [id number] Date of Birth: 11/20/1955  [facility]) CM/SW Contact:    Kermit Balo, RN Phone Number: 05/07/2023, 1:21 PM   Clinical Narrative:  Pt is s/p TLIF. No follow up per therapies and no DME needs.   Transition of Care Asessment: Insurance and Status: Insurance coverage has been reviewed Patient has primary care physician: Yes Home environment has been reviewed: home with spouse   Prior/Current Home Services: No current home services Social Drivers of Health Review: SDOH reviewed no interventions necessary Readmission risk has been reviewed: Yes Transition of care needs: no transition of care needs at this time

## 2023-05-07 NOTE — Evaluation (Signed)
Physical Therapy Evaluation and Discharge Patient Details Name: Rodney Watkins MRN: [id number] DOB: [birth year] Today's Date: 05/07/2023  History of Present Illness  Rodney Watkins  67 y.o. male who underwent L5-S1 TLIF 12/26. PMHx: bil CTS, hyperlipidemia, melanoma, arthritis, peripheral neuropathy, prostate cancer,  Clinical Impression   Patient evaluated by Physical Therapy with no further acute PT needs identified. All education has been completed and the patient has no further questions.  See below for any follow-up Physical Therapy or equipment needs. PT is signing off. Thank you for this referral. Per patient, MD told him if he did well with therapies he could discharge home today. From PT perspective he may be discharged.          If plan is discharge home, recommend the following: Assistance with cooking/housework;Assist for transportation;Help with stairs or ramp for entrance   Can travel by private vehicle        Equipment Recommendations None recommended by PT  Recommendations for Other Services  OT consult    Functional Status Assessment Patient has had a recent decline in their functional status and demonstrates the ability to make significant improvements in function in a reasonable and predictable amount of time.     Precautions / Restrictions Precautions Precautions: Back Precaution Booklet Issued: Yes (comment) Precaution Comments: pt required min cues to not twist during session Required Braces or Orthoses: Spinal Brace Spinal Brace: Lumbar corset      Mobility  Bed Mobility Overal bed mobility: Needs Assistance Bed Mobility: Rolling, Sidelying to Sit, Sit to Sidelying Rolling: Supervision Sidelying to sit: Supervision     Sit to sidelying: Supervision General bed mobility comments: vc for technique    Transfers Overall transfer level: Independent Equipment used: None                    Ambulation/Gait Ambulation/Gait  assistance: Independent Gait Distance (Feet): 180 Feet Assistive device: None Gait Pattern/deviations: WFL(Within Functional Limits)   Gait velocity interpretation: >2.62 ft/sec, indicative of community ambulatory      Stairs Stairs: Yes Stairs assistance: Min assist Stair Management: No rails, Two rails, Step to pattern, Forwards Number of Stairs: 4 General stair comments: initial 2 steps with rails; next 2 steps with HHA as pt has no rails from outside to get in  Wheelchair Mobility     Tilt Bed    Modified Rankin (Stroke Patients Only)       Balance Overall balance assessment: Independent                                           Pertinent Vitals/Pain Pain Assessment Pain Assessment: 0-10 Pain Score: 6  Pain Location: back Pain Descriptors / Indicators: Operative site guarding Pain Intervention(s): Limited activity within patient's tolerance, Monitored during session, Premedicated before session, Repositioned    Home Living Family/patient expects to be discharged to:: Private residence Living Arrangements: Spouse/significant other Available Help at Discharge: Family Type of Home: House Home Access: Stairs to enter Entrance Stairs-Rails: None Entrance Stairs-Number of Steps: 2 Alternate Level Stairs-Number of Steps: flight Home Layout: Two level;Bed/bath upstairs Home Equipment: Crutches;Cane - single point      Prior Function Prior Level of Function : Independent/Modified Independent;Driving;Working/employed (part-time Art gallery manager; retired IT sales professional)                     Production assistant, radio  Upper Extremity Assessment Upper Extremity Assessment: Defer to OT evaluation    Lower Extremity Assessment Lower Extremity Assessment: Overall WFL for tasks assessed    Cervical / Trunk Assessment Cervical / Trunk Assessment: Back Surgery  Communication   Communication Communication: No apparent difficulties Cueing  Techniques: Verbal cues  Cognition Arousal: Alert Behavior During Therapy: WFL for tasks assessed/performed Overall Cognitive Status: Within Functional Limits for tasks assessed                                          General Comments General comments (skin integrity, edema, etc.): Discussed padding his recliner to prevent bending/slouching while sitting    Exercises     Assessment/Plan    PT Assessment Patient does not need any further PT services  PT Problem List         PT Treatment Interventions      PT Goals (Current goals can be found in the Care Plan section)  Acute Rehab PT Goals Patient Stated Goal: go home today PT Goal Formulation: All assessment and education complete, DC therapy    Frequency       Co-evaluation               AM-PAC PT "6 Clicks" Mobility  Outcome Measure Help needed turning from your back to your side while in a flat bed without using bedrails?: A Little Help needed moving from lying on your back to sitting on the side of a flat bed without using bedrails?: A Little Help needed moving to and from a bed to a chair (including a wheelchair)?: None Help needed standing up from a chair using your arms (e.g., wheelchair or bedside chair)?: None Help needed to walk in hospital room?: None Help needed climbing 3-5 steps with a railing? : None 6 Click Score: 22    End of Session   Activity Tolerance: Patient tolerated treatment well Patient left: in chair;with call bell/phone within reach Nurse Communication: Mobility status;Other (comment) (OK to dc per PT) PT Visit Diagnosis: Other abnormalities of gait and mobility (R26.89)    Time: 1191-4782 PT Time Calculation (min) (ACUTE ONLY): 23 min   Charges:   PT Evaluation $PT Eval Low Complexity: 1 Low PT Treatments $Therapeutic Activity: 8-22 mins PT General Charges $$ ACUTE PT VISIT: 1 Visit          Jerolyn Center, PT Acute Rehabilitation Services  Office  [phone]   Rodney Watkins 05/07/2023, 10:02 AM

## 2023-05-07 NOTE — Plan of Care (Signed)
  Problem: Education: Goal: Knowledge of General Education information will improve Description: Including pain rating scale, medication(s)/side effects and non-pharmacologic comfort measures Outcome: Progressing   Problem: Clinical Measurements: Goal: Will remain free from infection Outcome: Progressing Goal: Diagnostic test results will improve Outcome: Progressing Goal: Respiratory complications will improve Outcome: Progressing   Problem: Clinical Measurements: Goal: Diagnostic test results will improve Outcome: Progressing   Problem: Nutrition: Goal: Adequate nutrition will be maintained Outcome: Progressing   Problem: Coping: Goal: Level of anxiety will decrease Outcome: Progressing   Problem: Elimination: Goal: Will not experience complications related to bowel motility Outcome: Progressing Goal: Will not experience complications related to urinary retention Outcome: Progressing   Problem: Elimination: Goal: Will not experience complications related to bowel motility Outcome: Progressing   Problem: Elimination: Goal: Will not experience complications related to urinary retention Outcome: Progressing   Problem: Pain Management: Goal: General experience of comfort will improve Outcome: Progressing

## 2023-05-10 NOTE — Discharge Summary (Signed)
Patient ID: LOWMAN JENNIGES MRN: [id number] DOB/AGE: June 02, 1955 67 y.o.  Admit date: 05/06/2023 Discharge date: 05/10/2023  Admission Diagnoses:  Principal Problem:   S/P lumbar fusion   Discharge Diagnoses:  Principal Problem:   S/P lumbar fusion  status post Procedure(s): TRANSFORAMINAL LUMBAR INTERBODY FUSION (TLIF) WITH PEDICLE SCREW FIXATION 1 LEVEL  L5-S1  Past Medical History:  Diagnosis Date   Atypical chest pain 05/2019   Insurance denied stress test I ordered.  Pt chose watchful waiting approach at that time.   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   IFG (impaired fasting glucose)    Per old records.  A1c 5.7% 05/2017.  A1c 5.3% 10/2017. A1c 5.9% 06/2020.   Incisional hernia without obstruction or gangrene 05/2017   Kidney stone    Lumbar spondylosis    w/pars defect and listhesis (Dr. Wynetta Emery).  Referred by neurosurg to pain mgmt MD and pt got ESI x 2 ---no help.   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 Truett Perna)   Osteoarthritis 2021   1st MTP R foot   Paresthesias    UEs and LE's 2019---recommended neuro referral and pt wanted to think about it.  Then, Dr. Allena Katz evaluated him 05/2018 and did NCS/EMG-->normal.  The sensation transitioned to BURNING  pain->NCS/EMG normal again 09/2020.   Peripheral neuropathy    small fiber neuropathy (nerve bx)   Prostate cancer (HCC)    Locally advanced prostate ca 06/2016.  Prostatectomy  RAL radical prostatectomy 07/2016.  Biochemical recurrence 2020/2021--salvage RT+ ADT. Undect PSA 02/2020, 09/2020, 12/2021   Pulmonary nodule 2009   Stable CT chest 2010--no further scans needed in pt at low risk for lung ca.   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   Squamous cell carcinoma of skin 08/05/2011   Left Calf (well diff) curet and 5FU)    Surgeries: Procedure(s): TRANSFORAMINAL LUMBAR INTERBODY FUSION (TLIF) WITH PEDICLE SCREW FIXATION 1 LEVEL  L5-S1 on 05/06/2023   Consultants:   Discharged Condition: Improved  Hospital Course: DOMETRIUS DUFOUR III is an 67 y.o. male who was admitted 05/06/2023 for operative treatment of S/P lumbar fusion. Patient failed conservative treatments (please see the history and physical for the specifics) and had severe unremitting pain that affects sleep, daily activities and work/hobbies. After pre-op clearance, the patient was taken to the operating room on 05/06/2023 and underwent  Procedure(s): TRANSFORAMINAL LUMBAR INTERBODY FUSION (TLIF) WITH PEDICLE SCREW FIXATION 1 LEVEL  L5-S1.    Patient was given perioperative antibiotics:  Anti-infectives (From admission, onward)    Start  Dose/Rate Route Frequency Ordered Stop   05/06/23 1800  ceFAZolin (ANCEF) IVPB 1 g/50 mL premix        1 g 100 mL/hr over 30 Minutes Intravenous Every 8 hours 05/06/23 1709 05/07/23 0334   05/06/23 0840  ceFAZolin (ANCEF) IVPB 2g/100 mL premix        2 g 200 mL/hr over 30 Minutes Intravenous 30 min pre-op 05/06/23 0840 05/06/23 1116        Patient was given sequential compression devices and early ambulation to prevent DVT.   Patient benefited maximally from hospital stay and there  were no complications. At the time of discharge, the patient was urinating/moving their bowels without difficulty, tolerating a regular diet, pain is controlled with oral pain medications and they have been cleared by PT/OT.   Recent vital signs: No data found.   Recent laboratory studies: No results for input(s): "WBC", "HGB", "HCT", "PLT", "NA", "K", "CL", "CO2", "BUN", "CREATININE", "GLUCOSE", "INR", "CALCIUM" in the last 72 hours.  Invalid input(s): "PT", "2"   Discharge Medications:   Allergies as of 05/07/2023       Reactions   Atorvastatin Other (See Comments)   Myalgias/arthralgias        Medication List     STOP taking these medications    Alpha-Lipoic Acid 300 MG Caps   b complex vitamins capsule   Cholecalciferol 25 MCG (1000 UT) tablet   ibuprofen 200 MG tablet Commonly known as: ADVIL   Magnesium 250 MG Tabs   multivitamin tablet   sildenafil 100 MG tablet Commonly known as: VIAGRA       TAKE these medications    cetirizine 10 MG tablet Commonly known as: ZYRTEC Take 10 mg by mouth daily as needed for allergies.   methocarbamol 500 MG tablet Commonly known as: ROBAXIN Take 1 tablet (500 mg total) by mouth every 8 (eight) hours as needed for up to 5 days for muscle spasms.   ondansetron 4 MG tablet Commonly known as: Zofran Take 1 tablet (4 mg total) by mouth every 8 (eight) hours as needed for nausea or vomiting.   oxyCODONE-acetaminophen 10-325 MG tablet Commonly known as: Percocet Take 1 tablet by mouth every 6 (six) hours as needed for up to 5 days for pain.        Diagnostic Studies: DG Lumbar Spine 2-3 Views Result Date: 05/06/2023 CLINICAL DATA:  [id number] Surgery, elective [id number]; Surg; surg EXAM: LUMBAR SPINE - 2-3 VIEW; DG C-ARM 1-60 MIN-NO REPORT COMPARISON:  X-ray lumbar spine 04/28/2016 FINDINGS: Intraoperative L5-S1 posterolateral and interbody surgical hardware fusion. 3low resolution intraoperative spot views of the lower  lumbar spine were obtained. No acute fracture visible on the limited views. Bilateral L5 pars interarticularis defects again noted. Grade 1 anterolisthesis of L5 on S1 again noted. Total fluoroscopy time: 204 second Total radiation dose: 141.5 mGy IMPRESSION: Intraoperative L5-S1 posterolateral and interbody surgical hardware fusion. Electronically Signed   By: Tish Frederickson M.D.   On: 05/06/2023 18:27   DG C-Arm 1-60 Min-No Report Result Date: 05/06/2023 Fluoroscopy was utilized by the requesting physician.  No radiographic interpretation.   DG C-Arm 1-60 Min-No Report Result Date: 05/06/2023 Fluoroscopy was utilized by the requesting physician.  No radiographic interpretation.   DG C-Arm 1-60 Min-No Report Result Date: 05/06/2023 Fluoroscopy was utilized by the requesting physician.  No radiographic interpretation.    Discharge Instructions     Incentive spirometry RT   Complete by: As directed  Follow-up Information     Venita Lick, MD. Schedule an appointment as soon as possible for a visit in 2 week(s).   Specialty: Orthopedic Surgery Why: If symptoms worsen, For wound re-check, For suture removal Contact information: [address] [place] [address] [phone]                 Discharge Plan:  discharge to home  Disposition: Balam is a very pleasant 67 year old gentleman who had significant back buttock and radicular leg pain.  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  As a result of the failure to improve with conservative modalities we elected to move forward with an L5-S1 TLIF.  Patient tolerated the procedure well and surgery was uneventful.  At the time of discharge he was cleared by PT/OT.  He was voiding spontaneously and ambulating without difficulty.  Patient's pain was controlled with oral medications.  Appropriate instructions and medications were provided.  He will follow-up with me in 2  weeks for reevaluation.    Signed: Alvy Beal for Dr. Venita Lick [facility] [phone] 05/10/2023, 7:38 AM

## 2023-11-24 ENCOUNTER — Telehealth: Payer: Self-pay | Admitting: Oncology

## 2023-11-24 NOTE — Telephone Encounter (Signed)
 Patient called to reschedule 8/12 1 year follow-up appt with Dr.Sherrill

## 2023-12-16 ENCOUNTER — Inpatient Hospital Stay: Attending: Oncology

## 2023-12-16 ENCOUNTER — Inpatient Hospital Stay (HOSPITAL_BASED_OUTPATIENT_CLINIC_OR_DEPARTMENT_OTHER): Admitting: Oncology

## 2023-12-16 VITALS — BP 134/88 | HR 69 | Temp 98.3°F | Resp 18 | Ht 72.0 in | Wt 205.3 lb

## 2023-12-16 DIAGNOSIS — Z8546 Personal history of malignant neoplasm of prostate: Secondary | ICD-10-CM | POA: Insufficient documentation

## 2023-12-16 DIAGNOSIS — Z9079 Acquired absence of other genital organ(s): Secondary | ICD-10-CM | POA: Insufficient documentation

## 2023-12-16 DIAGNOSIS — D472 Monoclonal gammopathy: Secondary | ICD-10-CM | POA: Insufficient documentation

## 2023-12-16 DIAGNOSIS — Z923 Personal history of irradiation: Secondary | ICD-10-CM | POA: Diagnosis not present

## 2023-12-16 DIAGNOSIS — G629 Polyneuropathy, unspecified: Secondary | ICD-10-CM | POA: Insufficient documentation

## 2023-12-16 LAB — CMP (CANCER CENTER ONLY)
ALT: 16 U/L (ref 0–44)
AST: 23 U/L (ref 15–41)
Albumin: 4.6 g/dL (ref 3.5–5.0)
Alkaline Phosphatase: 63 U/L (ref 38–126)
Anion gap: 11 (ref 5–15)
BUN: 15 mg/dL (ref 8–23)
CO2: 22 mmol/L (ref 22–32)
Calcium: 10.1 mg/dL (ref 8.9–10.3)
Chloride: 104 mmol/L (ref 98–111)
Creatinine: 1.06 mg/dL (ref 0.61–1.24)
GFR, Estimated: >60 mL/min (ref >60)
Glucose, Bld: 114 mg/dL — ABNORMAL HIGH (ref 70–99)
Potassium: 4.3 mmol/L (ref 3.5–5.1)
Sodium: 138 mmol/L (ref 135–145)
Total Bilirubin: 0.5 mg/dL (ref 0.0–1.2)
Total Protein: 7 g/dL (ref 6.5–8.1)

## 2023-12-16 LAB — CBC WITH DIFFERENTIAL (CANCER CENTER ONLY)
Abs Immature Granulocytes: 0.01 K/uL (ref 0.00–0.07)
Basophils Absolute: 0.1 K/uL (ref 0.0–0.1)
Basophils Relative: 1 %
Eosinophils Absolute: 0.1 K/uL (ref 0.0–0.5)
Eosinophils Relative: 2 %
HCT: 45.3 % (ref 39.0–52.0)
Hemoglobin: 15.2 g/dL (ref 13.0–17.0)
Immature Granulocytes: 0 %
Lymphocytes Relative: 20 %
Lymphs Abs: 0.9 K/uL (ref 0.7–4.0)
MCH: 30.8 pg (ref 26.0–34.0)
MCHC: 33.6 g/dL (ref 30.0–36.0)
MCV: 91.7 fL (ref 80.0–100.0)
Monocytes Absolute: 0.5 K/uL (ref 0.1–1.0)
Monocytes Relative: 12 %
Neutro Abs: 3 K/uL (ref 1.7–7.7)
Neutrophils Relative %: 65 %
Platelet Count: 296 K/uL (ref 150–400)
RBC: 4.94 MIL/uL (ref 4.22–5.81)
RDW: 13.9 % (ref 11.5–15.5)
WBC Count: 4.6 K/uL (ref 4.0–10.5)
nRBC: 0 % (ref 0.0–0.2)

## 2023-12-16 NOTE — Progress Notes (Signed)
  [facility] OFFICE PROGRESS NOTE   Diagnosis: Serum monoclonal protein, prostate cancer  INTERVAL HISTORY:   Rodney Watkins returns as scheduled.  He generally feels well.  He has persistent numbness and burning discomfort in the feet and lower legs.  Good appetite.  No other pain.  No fever.  No recent infection.  Objective:  Vital signs in last 24 hours:  Blood pressure 134/88, pulse 69, temperature 98.3 F (36.8 C), temperature source Temporal, resp. rate 18, height 6' (1.829 m), weight 205 lb 4.8 oz (93.1 kg), SpO2 99%.   Lymphatics: No cervical, supraclavicular, axillary, or inguinal nodes Resp: Lungs clear bilaterally Cardio: Regular rate and rhythm GI: No hepatosplenomegaly Vascular: No leg edema  Lab Results:  Lab Results  Component Value Date   WBC 4.6 12/16/2023   HGB 15.2 12/16/2023   HCT 45.3 12/16/2023   MCV 91.7 12/16/2023   PLT 296 12/16/2023   NEUTROABS 3.0 12/16/2023    CMP  Lab Results  Component Value Date   NA 137 04/29/2023   K 4.1 04/29/2023   CL 106 04/29/2023   CO2 25 04/29/2023   GLUCOSE 101 (H) 04/29/2023   BUN 14 04/29/2023   CREATININE 0.99 04/29/2023   CALCIUM  9.5 04/29/2023   PROT 6.7 03/23/2023   ALBUMIN 4.1 03/23/2023   AST 22 03/23/2023   ALT 18 03/23/2023   ALKPHOS 39 03/23/2023   BILITOT 0.6 03/23/2023   GFRNONAA >60 04/29/2023   GFRAA >60 05/04/2019     Imaging:  No results found.  Medications: I have reviewed the patient's current medications.   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  There is no clinical or laboratory evidence for progression to multiple myeloma or another lymphoproliferative disorder.  We will follow-up on the myeloma panel from today.  The peripheral neuropathy is most likely unrelated to the serum monoclonal protein.  He had a nerve biopsy in June 2022 which confirmed a moderate decrease in nerve fiber density and degenerative changes.  We will request amyloid testing on the 2022 biopsy.  Rodney Watkins will return for office and lab visit in 8 months.  Arley Hof, MD  12/16/2023  9:13 AM

## 2023-12-17 ENCOUNTER — Other Ambulatory Visit: Payer: Self-pay | Admitting: *Deleted

## 2023-12-17 DIAGNOSIS — D472 Monoclonal gammopathy: Secondary | ICD-10-CM

## 2023-12-17 LAB — KAPPA/LAMBDA LIGHT CHAINS
Kappa free light chain: 16.2 mg/L (ref 3.3–19.4)
Kappa, lambda light chain ratio: 1.18 (ref 0.26–1.65)
Lambda free light chains: 13.7 mg/L (ref 5.7–26.3)

## 2023-12-17 NOTE — Progress Notes (Signed)
 Called and spoke with pt after calling [facility] and they no longer have specimen from 10/16/20 to add on testing.  Referral entered for an appt with Dr Tobie per Dr Cloretta. Informed pt of referral also

## 2023-12-20 ENCOUNTER — Ambulatory Visit: Payer: Self-pay | Admitting: Oncology

## 2023-12-20 LAB — PROTEIN ELECTROPHORESIS, SERUM
A/G Ratio: 1.7 (ref 0.7–1.7)
Albumin ELP: 4.3 g/dL (ref 2.9–4.4)
Alpha-1-Globulin: 0.1 g/dL (ref 0.0–0.4)
Alpha-2-Globulin: 0.5 g/dL (ref 0.4–1.0)
Beta Globulin: 0.9 g/dL (ref 0.7–1.3)
Gamma Globulin: 1 g/dL (ref 0.4–1.8)
Globulin, Total: 2.6 g/dL (ref 2.2–3.9)
M-Spike, %: 0.6 g/dL — ABNORMAL HIGH
Total Protein ELP: 6.9 g/dL (ref 6.0–8.5)

## 2023-12-21 ENCOUNTER — Other Ambulatory Visit: Payer: Self-pay

## 2023-12-21 ENCOUNTER — Other Ambulatory Visit: Payer: 59

## 2023-12-21 ENCOUNTER — Ambulatory Visit: Payer: 59 | Admitting: Oncology

## 2023-12-21 NOTE — Telephone Encounter (Signed)
 Patient voiced understanding, inquired to see  if cholesterol levels were checked, Made aware they were not and to follow-up with primary to check, understood. No further questions.

## 2023-12-21 NOTE — Telephone Encounter (Signed)
-----   Message from Arley Hof sent at 12/20/2023  5:00 PM EDT ----- Please call patient, the serum monoclonal protein level is stable, follow-up as scheduled ----- Message ----- From: Rebecka, Lab In [place] Sent: 12/16/2023   8:52 AM EDT To: Arley KATHEE Hof, MD

## 2023-12-29 ENCOUNTER — Ambulatory Visit (INDEPENDENT_AMBULATORY_CARE_PROVIDER_SITE_OTHER): Admitting: Neurology

## 2023-12-29 VITALS — BP 146/91 | HR 81 | Ht 72.0 in | Wt 206.0 lb

## 2023-12-29 DIAGNOSIS — M79604 Pain in right leg: Secondary | ICD-10-CM | POA: Diagnosis not present

## 2023-12-29 DIAGNOSIS — Z981 Arthrodesis status: Secondary | ICD-10-CM | POA: Diagnosis not present

## 2023-12-29 DIAGNOSIS — G629 Polyneuropathy, unspecified: Secondary | ICD-10-CM

## 2023-12-29 DIAGNOSIS — M79605 Pain in left leg: Secondary | ICD-10-CM

## 2023-12-29 NOTE — Progress Notes (Signed)
 Follow-up Visit   Date: 12/29/23   Rodney Watkins MRN: [id number] DOB: 1956/01/19   Interim History: Rodney Watkins is a 68 y.o. right-handed Caucasian male with hyperlipidemia, lumbar spondylosis, prostate cancer s/p robotic prostatectomy returning to the clinic for follow-up of small fiber neuropathy. He is a retired IT sales professional.  The patient was accompanied to the clinic by self.  IMPRESSION/PLAN: Small fiber neuropathy, biopsy proven, idiopathic.  He as found to have IgG kappa monoclonal protein, which I think is incidental and unlikely to be the primary cause for his neuropathy.  Oncology notes reviewed.  He was also seen at [facility] as second opinion for neuropathy in 2023 whose notes were reviewed. He continues to have burning pain and paresthesias and has some relief with alternative therapies.  Management remains supportive.  Medications such as duloxetine were declined.  Bilateral leg pain/stiffness seems to be musculoskeletal.  He has had lumbar fusion by Dr Burnetta in December.  I will refer him to start PT for leg stretching.   Return to clinic as needed  ----------------------------------------------- History of present illness: In 01/2015, he was on an elliptical machine and began having hip pain.  He was evaluated by orthopaedic surgery and was found to have bilateral L5 pars defect which was radiating to his hip. He was referred to Dr. Onetha who did not find any role for surgery and referred to Dr. Letha for pain management.    In January 2018, he was diagnosed with prostate cancer and prostatectomy.  Following his surgery, he could not raise his right leg which was thought to be secondary to obturator nerve injury.  He was also having pain from his going into right medial thigh.  He has tried epidural steroid injections, dry needling, and chiropractic adjustments with no benefit.  He was having some initial improvement with physical therapy with  respect to right leg weakness, however he exercises aggravated his low back pain so this was stopped.  He also complains of generalized tingling of the feet and bilateral arms.  He is concerned that he cannot run and feels that there is lack of coordination.  He has no problems with walking or jogging.    He complaints of bilateral leg pain and tingling of the feet.  He is able to walk and job, but cannot run because he feels that his coordination is not there.  He complains of sharp pain from the groin into his medial nerve.    He has not history diabetes.  He drinks beer over the weekend.    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   He was evaluated here in 2020.  NCS/EMG of the legs is normal, he has mild carpal tunnel syndrome.   In June 2021, his prostate cancer returned. He underwent radiation treatment.  In August 2021, he began having severe bilateral feet, severe pain, limited him from walking and putting pressure on the feet.  In October, pain transitioned into burning pain and a month later, he developed swelling in the feet and ankle. Pain is always worse after he has been on his feet for sometime.  He was referred to see Dr. Joane, Sports Medicine and to see me.  He denies any numbness/tingling of the feet. No weakness or falls. He also complains of chronic low back pain.    UPDATE 11/14/2020:  He recently had skin biopsy which confirmed the presence of small fiber neuropathy.  He continues  to complain to burning pain in the feet.  He takes gabapentin  100mg  as needed because of sedation.  He does not want to take medication and is hoping to be able to fix the neuropathy. He also complains of achy low back pain, hip pain, diarrhea, and blurred vision.  He has seen an eye doctor.  He completed PT in the past for his back, which helped.    UPDATE 02/26/2021:  He is here for follow-up of small fiber neuropathy.  Last time he was here, I checked a number of labs to look for  potential cause, which returned normal, except for M spike with IgG kappa.  He was seen by hematology who diagnosed him with MGUS.  He would like to know what his causing his neuropathy.  For his paresthesias, he takes gabapentin  200mg  in the morning and noticed that the burning sensation in his feet as improved and does not occur daily. He completed PT for low back pain which has helped, but now he has left sided hip pain which radiates into his medial thigh.  He tells me that his low back issues presented like this in the past.     UPDATE 12/29/2023:  He was last seen in 2022 for small fiber neuropathy.  Incidentally, his work-up revealed IgG kappa monoclonal protein, which has been stable.  He is followed by oncology.  He sought second opinion for this at [facility] who also felt symptoms were idiopathic.  He returns today with ongoing burning, sharp pain, and numbness involving the feet.  He has been seeing [facility] in [place] and has been doing infrared therapy and soaking feet in saline with electrical stimulation for the past year.  This has not improved any pain, but feels that it has stabilized his symptoms.  He has not been doing this for the past two months and pain has intensified. He also has achy and throbbing pain involving the entire leg up to his hips.    He also underwent lumbar fusion at L5-S1 by Dr. Burnetta at [facility] in December 2024.    Medications:  Current Outpatient Medications on File Prior to Visit  Medication Sig Dispense Refill   ALPHA LIPOIC ACID PO Take 600 mg by mouth daily. (Patient not taking: Reported on 12/16/2023)     cetirizine (ZYRTEC) 10 MG tablet Take 10 mg by mouth daily as needed for allergies.     Multiple Vitamin (MULTIVITAMIN) tablet Take 1 tablet by mouth daily.     No current facility-administered medications on file prior to visit.    Allergies:  Allergies  Allergen Reactions   Atorvastatin  Other (See Comments)     Myalgias/arthralgias    Vital Signs:  BP (!) 146/91   Pulse 81   Ht 6' (1.829 m)   Wt 206 lb (93.4 kg)   SpO2 98%   BMI 27.94 kg/m   Neurological Exam: MENTAL STATUS including orientation to time, place, person, recent and remote memory, attention span and concentration, language, and fund of knowledge is normal.  Speech is not dysarthric.  CRANIAL NERVES: Pupils equal round and reactive to light.  Normal conjugate, extra-ocular eye movements in all directions of gaze.  No ptosis.   MOTOR:  Motor strength is 5/5 in all extremities.  No atrophy, fasciculations or abnormal movements.  No pronator drift.  Tone is normal.    MSRs:  Reflexes are 2+/4 throughout.  SENSORY:  Intact to vibration and temperature throughout.  COORDINATION/GAIT:   Gait  narrow based and stable.    Data:  MRI lumbar spine 07/09/2016: No change since 2016. 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 Degeneration and bulging of the disc. Mild foraminal narrowing bilaterally without visible compression of the exiting L5 nerve roots.  NCS/EMG of the legs 06/02/2018:  Normal  NCS/EMG of the arms 06/14/2018:  Bilateral median neuropathy at or distal to the wrist, consistent with a clinical diagnosis of carpal tunnel syndrome.  Overall, these findings are mild in degree electrically.  NCS/EMG of the legs 09/17/2020:  This is a normal study of the lower extremities.  Of note, there has been no significant change as compared to prior study on 06/02/2018.  In particular, there is no evidence of a sensorimotor polyneuropathy or lumbosacral radiculopathy   Skin biopsy 10/16/2020:  Intraepidermal nerve fiber density moderately reduced at the right calf, severely reduced at the left consistent with small fiber neuropathy  Lab Results  Component Value Date   HGBA1C 5.7 (H) 04/02/2023   Lab Results  Component Value Date   TSH 1.83 11/14/2020   Lab Results  Component Value Date   VITAMINB12 314  11/14/2020   Labs 11/14/2020:  CRP <1.0, vitamin B12 314, folate > 24, TSH 1.83, ESR 8, celiac panel neg, vitamin B1 27, SPEP with IFE IgG kappa M protein*, copper 84, ANA neg,   Total time spent reviewing records, interview, history/exam, documentation, and coordination of care on day of encounter:  40 minutes    Thank you for allowing me to participate in patient's care.  If I can answer any additional questions, I would be pleased to do so.    Sincerely,    [place] Attar K. Tobie, DO

## 2023-12-29 NOTE — Patient Instructions (Signed)
 We will refer you to [facility] for physical therapy to work on leg stretching

## 2024-03-07 ENCOUNTER — Ambulatory Visit: Admitting: Neurology

## 2024-04-04 ENCOUNTER — Encounter: Payer: Self-pay | Admitting: Family Medicine

## 2024-04-04 ENCOUNTER — Ambulatory Visit (INDEPENDENT_AMBULATORY_CARE_PROVIDER_SITE_OTHER): Payer: 59 | Admitting: Family Medicine

## 2024-04-04 VITALS — BP 124/80 | HR 79 | Temp 97.3°F | Ht 72.5 in | Wt 204.2 lb

## 2024-04-04 DIAGNOSIS — Z0000 Encounter for general adult medical examination without abnormal findings: Secondary | ICD-10-CM | POA: Diagnosis not present

## 2024-04-04 DIAGNOSIS — E7800 Pure hypercholesterolemia, unspecified: Secondary | ICD-10-CM | POA: Diagnosis not present

## 2024-04-04 DIAGNOSIS — R7301 Impaired fasting glucose: Secondary | ICD-10-CM | POA: Diagnosis not present

## 2024-04-04 NOTE — Patient Instructions (Signed)
 Health Maintenance, Male  Adopting a healthy lifestyle and getting preventive care are important in promoting health and wellness. Ask your health care provider about:  The right schedule for you to have regular tests and exams.  Things you can do on your own to prevent diseases and keep yourself healthy.  What should I know about diet, weight, and exercise?  Eat a healthy diet    Eat a diet that includes plenty of vegetables, fruits, low-fat dairy products, and lean protein.  Do not eat a lot of foods that are high in solid fats, added sugars, or sodium.  Maintain a healthy weight  Body mass index (BMI) is a measurement that can be used to identify possible weight problems. It estimates body fat based on height and weight. Your health care provider can help determine your BMI and help you achieve or maintain a healthy weight.  Get regular exercise  Get regular exercise. This is one of the most important things you can do for your health. Most adults should:  Exercise for at least 150 minutes each week. The exercise should increase your heart rate and make you sweat (moderate-intensity exercise).  Do strengthening exercises at least twice a week. This is in addition to the moderate-intensity exercise.  Spend less time sitting. Even light physical activity can be beneficial.  Watch cholesterol and blood lipids  Have your blood tested for lipids and cholesterol at 66 years of age, then have this test every 5 years.  You may need to have your cholesterol levels checked more often if:  Your lipid or cholesterol levels are high.  You are older than 66 years of age.  You are at high risk for heart disease.  What should I know about cancer screening?  Many types of cancers can be detected early and may often be prevented. Depending on your health history and family history, you may need to have cancer screening at various ages. This may include screening for:  Colorectal cancer.  Prostate cancer.  Skin cancer.  Lung  cancer.  What should I know about heart disease, diabetes, and high blood pressure?  Blood pressure and heart disease  High blood pressure causes heart disease and increases the risk of stroke. This is more likely to develop in people who have high blood pressure readings or are overweight.  Talk with your health care provider about your target blood pressure readings.  Have your blood pressure checked:  Every 3-5 years if you are 24-52 years of age.  Every year if you are 3 years old or older.  If you are between the ages of 60 and 72 and are a current or former smoker, ask your health care provider if you should have a one-time screening for abdominal aortic aneurysm (AAA).  Diabetes  Have regular diabetes screenings. This checks your fasting blood sugar level. Have the screening done:  Once every three years after age 66 if you are at a normal weight and have a low risk for diabetes.  More often and at a younger age if you are overweight or have a high risk for diabetes.  What should I know about preventing infection?  Hepatitis B  If you have a higher risk for hepatitis B, you should be screened for this virus. Talk with your health care provider to find out if you are at risk for hepatitis B infection.  Hepatitis C  Blood testing is recommended for:  Everyone born from [birth year] through 1963.  Anyone  with known risk factors for hepatitis C.  Sexually transmitted infections (STIs)  You should be screened each year for STIs, including gonorrhea and chlamydia, if:  You are sexually active and are younger than 66 years of age.  You are older than 66 years of age and your health care provider tells you that you are at risk for this type of infection.  Your sexual activity has changed since you were last screened, and you are at increased risk for chlamydia or gonorrhea. Ask your health care provider if you are at risk.  Ask your health care provider about whether you are at high risk for HIV. Your health care provider  may recommend a prescription medicine to help prevent HIV infection. If you choose to take medicine to prevent HIV, you should first get tested for HIV. You should then be tested every 3 months for as long as you are taking the medicine.  Follow these instructions at home:  Alcohol use  Do not drink alcohol if your health care provider tells you not to drink.  If you drink alcohol:  Limit how much you have to 0-2 drinks a day.  Know how much alcohol is in your drink. In the [place] one drink equals one 12 oz bottle of beer (355 mL), one 5 oz glass of wine (148 mL), or one 1 oz glass of hard liquor (44 mL).  Lifestyle  Do not use any products that contain nicotine or tobacco. These products include cigarettes, chewing tobacco, and vaping devices, such as e-cigarettes. If you need help quitting, ask your health care provider.  Do not use street drugs.  Do not share needles.  Ask your health care provider for help if you need support or information about quitting drugs.  General instructions  Schedule regular health, dental, and eye exams.  Stay current with your vaccines.  Tell your health care provider if:  You often feel depressed.  You have ever been abused or do not feel safe at home.  Summary  Adopting a healthy lifestyle and getting preventive care are important in promoting health and wellness.  Follow your health care provider's instructions about healthy diet, exercising, and getting tested or screened for diseases.  Follow your health care provider's instructions on monitoring your cholesterol and blood pressure.  This information is not intended to replace advice given to you by your health care provider. Make sure you discuss any questions you have with your health care provider.  Document Revised: 09/16/2020 Document Reviewed: 09/16/2020  Elsevier Patient Education  2024 [employer].

## 2024-04-04 NOTE — Progress Notes (Signed)
 Office Note 04/04/2024  CC:  Chief Complaint  Patient presents with   Annual Exam    Pt is fasting    HPI:  Patient is a 68 y.o. male who is here for annual health maintenance exam. Zell is feeling well. His back surgery went good.  He feels better after having gone through some rehab. He feels like his diet is significantly improved lately.  He deals with his bilateral lower leg neuropathic pain pretty well with stretching and occasional dose of ibuprofen.  Past Medical History:  Diagnosis Date   Atypical chest pain 05/2019   Insurance denied stress test I ordered.  Pt chose watchful waiting approach at that time.   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   IFG (impaired fasting glucose)    Per old records.  A1c 5.7% 05/2017.  A1c 5.3% 10/2017. A1c 5.9% 06/2020.   Incisional hernia without obstruction or gangrene 05/2017   Kidney stone    Lumbar spondylosis    w/pars defect and listhesis (Dr. Onetha).  Referred by neurosurg to pain mgmt MD and pt got ESI x 2 ---no help.   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 Cloretta)   Osteoarthritis 2021   1st MTP R foot   Paresthesias    UEs and LE's 2019---recommended neuro referral and pt wanted to think about it.  Then, Dr.  Tobie evaluated him 05/2018 and did NCS/EMG-->normal.  The sensation transitioned to BURNING pain->NCS/EMG normal again 09/2020.   Peripheral neuropathy    small fiber neuropathy (nerve bx)   Prostate cancer (HCC)    Locally advanced prostate ca 06/2016.  Prostatectomy  RAL radical prostatectomy 07/2016.  Biochemical recurrence 2020/2021--salvage RT+ ADT. Undect PSA 02/2020, 09/2020, 12/2021   Pulmonary nodule 2009   Stable CT chest 2010--no further scans needed in pt at low risk for lung ca.   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   Squamous cell carcinoma of skin 08/05/2011   Left Calf (well diff) curet and 5FU)    Past Surgical History:  Procedure Laterality Date   COLONOSCOPY  04/20/11   Normal: recall 10 yrs   ett  approx 2010   normal   KNEE ARTHROSCOPY  2000   left   LYMPHADENECTOMY Bilateral 08/06/2016   Procedure: PELVIC LYMPHADENECTOMY;  Surgeon: Gretel Ferrara, MD;  Location: [facility];  Service: Urology;  Laterality: Bilateral;   NCV & EMG  06/02/2018; 06/14/2018; 09/2020   07/03/2018 (LEGS)->NORMAL.  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  09/2020 LEGS NORMAL    REFRACTIVE SURGERY  2000  both eyes   ROBOT ASSISTED LAPAROSCOPIC RADICAL PROSTATECTOMY N/A 08/06/2016   Procedure: Path: adenocarcinoma with extraprostatic extension present, negative margins, lymph nodes NEG. XI ROBOTIC ASSISTED LAPAROSCOPIC RADICAL PROSTATECTOMY LEVEL 2;  Surgeon: Gretel Ferrara, MD;  Location: [facility];  Service: Urology;  Laterality: N/A;   ROTATOR CUFF REPAIR  2002   right   ROTATOR CUFF REPAIR  2003   left   TRANSFORAMINAL LUMBAR INTERBODY FUSION (TLIF) WITH PEDICLE SCREW FIXATION 1 LEVEL N/A 05/06/2023   Procedure: TRANSFORAMINAL LUMBAR INTERBODY FUSION (TLIF) WITH PEDICLE SCREW FIXATION 1 LEVEL  L5-S1;  Surgeon: Burnetta Aures, MD;  Location: [facility];  Service: Orthopedics;   Laterality: N/A;    Family History  Problem Relation Age of Onset   Diabetes Father    Ovarian cancer Sister    Colon cancer Neg Hx    Stomach cancer Neg Hx    Prostate cancer Neg Hx    Breast cancer Neg Hx     Social History   Socioeconomic History   Marital status: Married    Spouse name: Rojelio   Number of children: 3   Years of education: Not on file   Highest education level: Bachelor's degree (e.g., BA, AB, BS)  Occupational History   Occupation: FIRE DEPARTMENT    Employer: [employer]    Comment: Retired  Tobacco Use   Smoking status: Never   Smokeless tobacco: Never  Vaping Use   Vaping status: Never Used  Substance and Sexual Activity   Alcohol use: Yes    Alcohol/week: 3.0 standard drinks of alcohol    Types: 3 Cans of beer per week    Comment: Beer on weekends   Drug use: No   Sexual activity: Yes  Other Topics Concern   Not on file  Social History Narrative   Married, 3 daughters.   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   No T/A/Ds.         Pt is right-handed. He lives with his wife and three daughters in a two level home. Prior to prostate cancer surgery in 2018, he exercised regularly. He walks now, weather permitting.   Social Drivers of Corporate Investment Banker Strain: Not on file  Food Insecurity: No Food Insecurity (05/06/2023)   Hunger Vital Sign    Worried About Running Out of Food in the Last Year: Never true    Ran Out of Food in the Last Year: Never true  Transportation Needs: No Transportation Needs (05/06/2023)   PRAPARE - Administrator, Civil Service (Medical): No    Lack of Transportation (Non-Medical): No  Physical Activity: Not on file  Stress: Not on file  Social Connections: Not on file  Intimate Partner Violence: Not At Risk (05/06/2023)   Humiliation, Afraid, Rape, and Kick questionnaire    Fear of Current or Ex-Partner: No     Emotionally Abused: No    Physically Abused: No    Sexually Abused: No    Outpatient Medications Prior to Visit  Medication Sig Dispense Refill   ALPHA LIPOIC ACID PO Take 600 mg by mouth daily.     b complex vitamins capsule Take 1 capsule by mouth daily.     cetirizine (ZYRTEC) 10 MG tablet Take 10 mg by mouth daily as needed for allergies.     magnesium  30 MG tablet Take 30 mg by mouth 2 (two) times daily.  Multiple Vitamin (MULTIVITAMIN) tablet Take 1 tablet by mouth daily.     No facility-administered medications prior to visit.    Allergies  Allergen Reactions   Atorvastatin  Other (See Comments)    Myalgias/arthralgias    Review of Systems  Constitutional:  Negative for appetite change, chills, fatigue and fever.  HENT:  Negative for congestion, dental problem, ear pain and sore throat.   Eyes:  Negative for discharge, redness and visual disturbance.  Respiratory:  Negative for cough, chest tightness, shortness of breath and wheezing.   Cardiovascular:  Negative for chest pain, palpitations and leg swelling.  Gastrointestinal:  Negative for abdominal pain, blood in stool, diarrhea, nausea and vomiting.  Genitourinary:  Negative for difficulty urinating, dysuria, flank pain, frequency, hematuria and urgency.  Musculoskeletal:  Negative for arthralgias, back pain, joint swelling, myalgias and neck stiffness.  Skin:  Negative for pallor and rash.  Neurological:  Negative for dizziness, speech difficulty, weakness and headaches.  Hematological:  Negative for adenopathy. Does not bruise/bleed easily.  Psychiatric/Behavioral:  Negative for confusion and sleep disturbance. The patient is not nervous/anxious.     PE;    04/04/2024    9:45 AM 12/29/2023   10:00 AM 12/16/2023    8:52 AM  Vitals with BMI  Height 6' 0.5 6' 0 6' 0  Weight 204 lbs 3 oz 206 lbs 205 lbs 5 oz  BMI 27.3 27.93 27.84  Systolic 124 146 865  Diastolic 80 91 88  Pulse 79 81 69   Gen: Alert, well  appearing.  Patient is oriented to person, place, time, and situation. AFFECT: pleasant, lucid thought and speech. ENT: Ears: EACs clear, normal epithelium.  TMs with good light reflex and landmarks bilaterally.  Eyes: no injection, icteris, swelling, or exudate.  EOMI, PERRLA. Nose: no drainage or turbinate edema/swelling.  No injection or focal lesion.  Mouth: lips without lesion/swelling.  Oral mucosa pink and moist.  Dentition intact and without obvious caries or gingival swelling.  Oropharynx without erythema, exudate, or swelling.  Neck: supple/nontender.  No LAD, mass, or TM.  Carotid pulses 2+ bilaterally, without bruits. CV: RRR, no m/r/g.   LUNGS: CTA bilat, nonlabored resps, good aeration in all lung fields. ABD: soft, NT, ND, BS normal.  No hepatospenomegaly or mass.  No bruits. EXT: no clubbing, cyanosis, or edema.  Musculoskeletal: no joint swelling, erythema, warmth, or tenderness.  ROM of all joints intact. Skin - no sores or suspicious lesions or rashes or color changes  Pertinent labs:  Lab Results  Component Value Date   TSH 1.83 11/14/2020   Lab Results  Component Value Date   WBC 4.6 12/16/2023   HGB 15.2 12/16/2023   HCT 45.3 12/16/2023   MCV 91.7 12/16/2023   PLT 296 12/16/2023   Lab Results  Component Value Date   CREATININE 1.06 12/16/2023   BUN 15 12/16/2023   NA 138 12/16/2023   K 4.3 12/16/2023   CL 104 12/16/2023   CO2 22 12/16/2023   Lab Results  Component Value Date   ALT 16 12/16/2023   AST 23 12/16/2023   ALKPHOS 63 12/16/2023   BILITOT 0.5 12/16/2023   Lab Results  Component Value Date   CHOL 206 (H) 04/02/2023   Lab Results  Component Value Date   HDL 56 04/02/2023   Lab Results  Component Value Date   LDLCALC 133 (H) 04/02/2023   Lab Results  Component Value Date   TRIG 80 04/02/2023   Lab Results  Component  Value Date   CHOLHDL 3.7 04/02/2023   Lab Results  Component Value Date   PSA <0.015 09/19/2020   PSA 0.067  01/23/2019   PSA 0.033 06/15/2018   Lab Results  Component Value Date   HGBA1C 5.7 (H) 04/02/2023   ASSESSMENT AND PLAN:   #1  health maintenance exam: Reviewed age and gender appropriate health maintenance issues (prudent diet, regular exercise, health risks of tobacco and excessive alcohol, use of seatbelts, fire alarms in home, use of sunscreen).  Also reviewed age and gender appropriate health screening as well as vaccine recommendations. Vaccines: prevnar 20-->declined.  Flu-->declined. Labs: lipids,cmet,cbc,hba1c Prostate ca screening: hx prost ca, PSAs followed by urol-->undetectable at most recent check and he's been moved out to [place] follow up with them! Colon ca screening: polyp 05/2021--->recall 05/2026.  #2 borderline hyperlipidemia. Has never had to be on medications. Lipid panel today.  3.  IFG. Monitor fasting glucose and hemoglobin A1c today.  An After Visit Summary was printed and given to the patient.  FOLLOW UP:  No follow-ups on file.  Signed:  Gerlene Hockey, MD           04/04/2024

## 2024-04-05 ENCOUNTER — Ambulatory Visit: Payer: Self-pay | Admitting: Family Medicine

## 2024-04-05 LAB — CBC WITH DIFFERENTIAL/PLATELET
Absolute Lymphocytes: 907 {cells}/uL (ref 850–3900)
Absolute Monocytes: 534 {cells}/uL (ref 200–950)
Basophils Absolute: 29 {cells}/uL (ref 0–200)
Basophils Relative: 0.6 %
Eosinophils Absolute: 69 {cells}/uL (ref 15–500)
Eosinophils Relative: 1.4 %
HCT: 46.6 % (ref 39.4–51.1)
Hemoglobin: 15.8 g/dL (ref 13.2–17.1)
MCH: 31.2 pg (ref 27.0–33.0)
MCHC: 33.9 g/dL (ref 31.6–35.4)
MCV: 92.1 fL (ref 81.4–101.7)
MPV: 10.1 fL (ref 7.5–12.5)
Monocytes Relative: 10.9 %
Neutro Abs: 3361 {cells}/uL (ref 1500–7800)
Neutrophils Relative %: 68.6 %
Platelets: 314 Thousand/uL (ref 140–400)
RBC: 5.06 Million/uL (ref 4.20–5.80)
RDW: 13.3 % (ref 11.0–15.0)
Total Lymphocyte: 18.5 %
WBC: 4.9 Thousand/uL (ref 3.8–10.8)

## 2024-04-05 LAB — HEMOGLOBIN A1C
Hgb A1c MFr Bld: 5.6 % (ref <5.7)
Mean Plasma Glucose: 114 mg/dL
eAG (mmol/L): 6.3 mmol/L

## 2024-04-05 LAB — COMPREHENSIVE METABOLIC PANEL WITH GFR
AG Ratio: 2 (calc) (ref 1.0–2.5)
ALT: 19 U/L (ref 9–46)
AST: 22 U/L (ref 10–35)
Albumin: 4.8 g/dL (ref 3.6–5.1)
Alkaline phosphatase (APISO): 53 U/L (ref 35–144)
BUN: 14 mg/dL (ref 7–25)
CO2: 27 mmol/L (ref 20–32)
Calcium: 10 mg/dL (ref 8.6–10.3)
Chloride: 103 mmol/L (ref 98–110)
Creat: 0.98 mg/dL (ref 0.70–1.35)
Globulin: 2.4 g/dL (ref 1.9–3.7)
Glucose, Bld: 94 mg/dL (ref 65–99)
Potassium: 4.7 mmol/L (ref 3.5–5.3)
Sodium: 139 mmol/L (ref 135–146)
Total Bilirubin: 0.9 mg/dL (ref 0.2–1.2)
Total Protein: 7.2 g/dL (ref 6.1–8.1)
eGFR: 85 mL/min/1.73m2 (ref >=60)

## 2024-04-05 LAB — LIPID PANEL
Cholesterol: 204 mg/dL — ABNORMAL HIGH (ref <200)
HDL: 64 mg/dL (ref >=40)
LDL Cholesterol (Calc): 126 mg/dL — ABNORMAL HIGH
Non-HDL Cholesterol (Calc): 140 mg/dL — ABNORMAL HIGH (ref <130)
Total CHOL/HDL Ratio: 3.2 (calc) (ref <5.0)
Triglycerides: 46 mg/dL (ref <150)

## 2024-08-15 ENCOUNTER — Ambulatory Visit: Admitting: Oncology

## 2024-08-15 ENCOUNTER — Other Ambulatory Visit

## 2025-04-10 ENCOUNTER — Encounter: Admitting: Family Medicine
